# Patient Record
Sex: Female | Born: 1985 | Race: White | Hispanic: No | Marital: Single | Smoking: Current some day smoker
Health system: Southern US, Community
[De-identification: ages and names within clinical notes are randomized; demographics above are authoritative.]

## PROBLEM LIST (undated history)

## (undated) ENCOUNTER — Ambulatory Visit (HOSPITAL_COMMUNITY): Discharge status: Absent without leave | Payer: Medicaid Other

## (undated) DIAGNOSIS — F32A Depression, unspecified: Secondary | ICD-10-CM

## (undated) DIAGNOSIS — F329 Major depressive disorder, single episode, unspecified: Secondary | ICD-10-CM

---

## 2009-06-21 ENCOUNTER — Ambulatory Visit: Payer: Self-pay | Admitting: Diagnostic Radiology

## 2009-06-21 ENCOUNTER — Emergency Department (HOSPITAL_BASED_OUTPATIENT_CLINIC_OR_DEPARTMENT_OTHER): Admission: EM | Admit: 2009-06-21 | Discharge: 2009-06-21 | Payer: Self-pay | Admitting: Emergency Medicine

## 2009-06-23 ENCOUNTER — Emergency Department (HOSPITAL_BASED_OUTPATIENT_CLINIC_OR_DEPARTMENT_OTHER): Admission: EM | Admit: 2009-06-23 | Discharge: 2009-06-23 | Payer: Self-pay | Admitting: Emergency Medicine

## 2009-06-26 ENCOUNTER — Emergency Department (HOSPITAL_BASED_OUTPATIENT_CLINIC_OR_DEPARTMENT_OTHER): Admission: EM | Admit: 2009-06-26 | Discharge: 2009-06-26 | Payer: Self-pay | Admitting: Emergency Medicine

## 2009-06-26 ENCOUNTER — Ambulatory Visit: Payer: Self-pay | Admitting: Diagnostic Radiology

## 2011-12-28 ENCOUNTER — Encounter (HOSPITAL_COMMUNITY): Payer: Self-pay | Admitting: *Deleted

## 2011-12-28 ENCOUNTER — Emergency Department (HOSPITAL_COMMUNITY): Payer: Self-pay

## 2011-12-28 ENCOUNTER — Emergency Department (INDEPENDENT_AMBULATORY_CARE_PROVIDER_SITE_OTHER)
Admission: EM | Admit: 2011-12-28 | Discharge: 2011-12-28 | Disposition: A | Discharge status: Nursing Home (Includes ICF) | Payer: Self-pay | Source: Home / Self Care | Attending: Emergency Medicine | Admitting: Emergency Medicine

## 2011-12-28 ENCOUNTER — Emergency Department (HOSPITAL_COMMUNITY)
Admission: EM | Admit: 2011-12-28 | Discharge: 2011-12-28 | Disposition: A | Discharge status: Home / Self Care | Payer: Self-pay | Attending: Internal Medicine | Admitting: Internal Medicine

## 2011-12-28 ENCOUNTER — Encounter (HOSPITAL_COMMUNITY): Payer: Self-pay

## 2011-12-28 DIAGNOSIS — M549 Dorsalgia, unspecified: Secondary | ICD-10-CM | POA: Insufficient documentation

## 2011-12-28 DIAGNOSIS — Z79899 Other long term (current) drug therapy: Secondary | ICD-10-CM | POA: Insufficient documentation

## 2011-12-28 DIAGNOSIS — R5383 Other fatigue: Secondary | ICD-10-CM | POA: Insufficient documentation

## 2011-12-28 DIAGNOSIS — R209 Unspecified disturbances of skin sensation: Secondary | ICD-10-CM | POA: Insufficient documentation

## 2011-12-28 DIAGNOSIS — R5381 Other malaise: Secondary | ICD-10-CM | POA: Insufficient documentation

## 2011-12-28 DIAGNOSIS — F17200 Nicotine dependence, unspecified, uncomplicated: Secondary | ICD-10-CM | POA: Insufficient documentation

## 2011-12-28 DIAGNOSIS — R42 Dizziness and giddiness: Secondary | ICD-10-CM | POA: Insufficient documentation

## 2011-12-28 DIAGNOSIS — R200 Anesthesia of skin: Secondary | ICD-10-CM

## 2011-12-28 DIAGNOSIS — N390 Urinary tract infection, site not specified: Secondary | ICD-10-CM

## 2011-12-28 DIAGNOSIS — M542 Cervicalgia: Secondary | ICD-10-CM

## 2011-12-28 LAB — BASIC METABOLIC PANEL WITH GFR
Chloride: 107 meq/L (ref 96–112)
GFR calc Af Amer: >90 mL/min (ref >90)
GFR calc non Af Amer: >90 mL/min (ref >90)
Glucose, Bld: 91 mg/dL (ref 70–99)
Potassium: 4.1 meq/L (ref 3.5–5.1)
Sodium: 142 meq/L (ref 135–145)

## 2011-12-28 LAB — DIFFERENTIAL
Basophils Absolute: 0 10*3/uL (ref 0.0–0.1)
Basophils Relative: 0 % (ref 0–1)
Eosinophils Absolute: 0.4 K/uL (ref 0.0–0.7)
Eosinophils Relative: 3 % (ref 0–5)
Lymphocytes Relative: 22 % (ref 12–46)
Lymphs Abs: 2.4 K/uL (ref 0.7–4.0)
Monocytes Absolute: 0.4 10*3/uL (ref 0.1–1.0)
Monocytes Relative: 4 % (ref 3–12)
Neutro Abs: 7.4 K/uL (ref 1.7–7.7)
Neutrophils Relative %: 70 % (ref 43–77)

## 2011-12-28 LAB — BASIC METABOLIC PANEL
BUN: 8 mg/dL (ref 6–23)
CO2: 25 mEq/L (ref 19–32)
Calcium: 9.7 mg/dL (ref 8.4–10.5)
Creatinine, Ser: 0.75 mg/dL (ref 0.50–1.10)

## 2011-12-28 LAB — CBC
HCT: 43.7 % (ref 36.0–46.0)
Hemoglobin: 14 g/dL (ref 12.0–15.0)
MCH: 30.8 pg (ref 26.0–34.0)
MCHC: 32 g/dL (ref 30.0–36.0)
MCV: 96.3 fL (ref 78.0–100.0)
Platelets: 284 K/uL (ref 150–400)
RBC: 4.54 MIL/uL (ref 3.87–5.11)
RDW: 12.9 % (ref 11.5–15.5)
WBC: 10.5 K/uL (ref 4.0–10.5)

## 2011-12-28 LAB — RAPID STREP SCREEN (MED CTR MEBANE ONLY): Streptococcus, Group A Screen (Direct): NEGATIVE

## 2011-12-28 MED ORDER — AMOXICILLIN 500 MG PO CAPS: 500.0000 mg | ORAL_CAPSULE | Freq: Three times a day (TID) | ORAL | Status: AC

## 2011-12-28 NOTE — ED Notes (Signed)
Pt  Reports  That  Last pm   She  Developed  An  Episode  Of  weakness to  r  Side  And  She  Was  unble  To fully  Raise  r  Leg  These  Symptoms  Occurred  Over  12  Hours  Ago      She  amulated  To  Exam  Room and is  Awake  As  Well  As  Alert  At  This  Time

## 2011-12-28 NOTE — Discharge Instructions (Signed)
 If weakness and numbness worsen, please return for re-evaluation at that time.  Your head CT scan was completely normal as were your blood tests.  Your test for strep infection was negative.  Take antibiotics for urinary tract infection and follow up at your doctor's office or urgent care next week if symptoms are not improving.

## 2011-12-28 NOTE — ED Provider Notes (Addendum)
History     CSN: [id number]  Arrival date & time 12/28/11  1054   First MD Initiated Contact with Patient 12/28/11 1117      Chief Complaint  Patient presents with  . Facial Swelling    (Consider location/radiation/quality/duration/timing/severity/associated sxs/prior treatment) HPI Comments: R sided "numbness" my upper arm and R leg" Lats night I couldn't move my R leg at all" since then it comes and goes" "Right now my R arm feels numb and my face" "Im feeling very dizzy"  "For months my urine smells bad" No pain , No fevers, No burning   Also noticed that my R face its very swollen and  have like a double chin"    Patient is a 26 y.o. female presenting with neurologic complaint. The history is provided by the patient.  Neurologic Problem The primary symptoms include dizziness, paresthesias, focal weakness and loss of sensation. Primary symptoms do not include visual change, fever, nausea or vomiting. The symptoms began yesterday. The symptoms are waxing and waning. The neurological symptoms are focal.  Dizziness also occurs with weakness. Dizziness does not occur with nausea or vomiting.  Additional symptoms include weakness. Additional symptoms do not include neck stiffness, leg pain, photophobia, aura, taste disturbance or anxiety. Medical issues do not include seizures or cerebral vascular accident. Workup history does not include CT scan.    History reviewed. No pertinent past medical history.  History reviewed. No pertinent past surgical history.  History reviewed. No pertinent family history.  History  Substance Use Topics  . Smoking status: Current Everyday Smoker  . Smokeless tobacco: Not on file  . Alcohol Use: No    OB History    Grav Para Term Preterm Abortions TAB SAB Ect Mult Living                  Review of Systems  Constitutional: Negative for fever, chills and appetite change.  HENT: Positive for neck pain. Negative for ear pain, congestion,  trouble swallowing and neck stiffness.   Eyes: Negative for photophobia.  Cardiovascular: Negative for chest pain.  Gastrointestinal: Negative for nausea and vomiting.  Genitourinary: Negative for dysuria, vaginal discharge and pelvic pain.  Neurological: Positive for dizziness, focal weakness, weakness, light-headedness, numbness and paresthesias. Negative for speech difficulty.    Allergies  Review of patient's allergies indicates no known allergies.  Home Medications   Current Outpatient Rx  Name Route Sig Dispense Refill  . METHADONE HCL 5 MG PO TABS Oral Take 5 mg by mouth 1 day or 1 dose.    Marland Kitchen AMOXICILLIN 500 MG PO CAPS Oral Take 1 capsule (500 mg total) by mouth 3 (three) times daily. 21 capsule 0  . IBUPROFEN 200 MG PO TABS Oral Take 400 mg by mouth every 8 (eight) hours as needed. For pain    . LEVONORGESTREL 20 MCG/24HR IU IUD Intrauterine 1 each by Intrauterine route once.    Marland Kitchen OVER THE COUNTER MEDICATION Oral Take 1 tablet by mouth 2 (two) times daily as needed. Allergic reaction      BP 144/87  Pulse 80  Temp(Src) 97.9 F (36.6 C) (Oral)  Resp 20  SpO2 98%  Physical Exam  Nursing note and vitals reviewed. Constitutional: She is oriented to person, place, and time. She appears well-developed and well-nourished.  HENT:  Head: Normocephalic. Head is without contusion.  Mouth/Throat: Uvula is midline, oropharynx is clear and moist and mucous membranes are normal.  Eyes: Conjunctivae and EOM are normal. Pupils are  equal, round, and reactive to light. No scleral icterus.  Neck: Trachea normal. Neck supple. No JVD present. No tracheal deviation present. No thyromegaly present.    Cardiovascular: Exam reveals no gallop and no friction rub.   Musculoskeletal: Normal range of motion. She exhibits no tenderness.  Lymphadenopathy:    She has cervical adenopathy.  Neurological: She is alert and oriented to person, place, and time. No cranial nerve deficit. She exhibits  normal muscle tone. Coordination normal.  Skin: No rash noted.    ED Course  Procedures (including critical care time)  Labs Reviewed - No data to display Ct Head Wo Contrast  12/28/2011  *RADIOLOGY REPORT*  Clinical Data: Right-sided weakness, headache  CT HEAD WITHOUT CONTRAST  Technique:  Contiguous axial images were obtained from the base of the skull through the vertex without contrast.  Comparison: None.  Findings: No evidence of parenchymal hemorrhage or extra-axial fluid collection. No mass lesion, mass effect, or midline shift.  No CT evidence of acute infarction.  Cerebral volume is age appropriate.  No ventriculomegaly.  The visualized paranasal sinuses are essentially clear. The mastoid air cells are unopacified.  No evidence of calvarial fracture.  IMPRESSION: Normal head CT.  Original Report Authenticated By: Charline Bills, M.D.     No diagnosis found.    MDM  Multiple concerns- Paresthesias and reported RLE weakness < 24 hours- No focal exam- Also with concerns of "urine with odor for months" No dysuria, mild palpable R sided anterior LAD.        Jimmie Molly, MD 12/28/11 1201  Jimmie Molly, MD 12/28/11 1840

## 2011-12-28 NOTE — ED Provider Notes (Signed)
History     CSN: [id number]  Arrival date & time 12/28/11  1200   First MD Initiated Contact with Patient 12/28/11 1414      Chief Complaint  Patient presents with  . Numbness    (Consider location/radiation/quality/duration/timing/severity/associated sxs/prior treatment) HPI Comments: Patient reports that for the last several days she has generally been feeling ill. She reports that she has a tender spot in her mid upper thoracic region without associated trauma. She denies any pleuritic pain. She reports that a couple of days ago she developed some right-sided neck pain. She denies sore throat or difficulty swallowing. She denies any shortness of breath. She reports with palpation on the right side of her neck that it is sore to touch. She also reports that she has a double chin and that she's never had that in the past and that it appears abnormal to her. There is no redness, induration or swelling other than what she describes to me as being new. She reports since yesterday she developed some numbness to the right side of her face that went down into her right arm and not associated with weakness. She however then developed numbness and weakness described as a heaviness to her right lower extremity he reports that she cannot lift her leg up nor turn her right foot from side to side. She reports that occurred intermittently for a few hours before resolving. She reports her right leg is completely back to normal and her gait is normal. However she endorses still a small area of numbness to the right side of her face and in her right shoulder. She was seen at the urgent care at this morning and was sent here to the emergency department for further evaluation due to her persistent numbness and report of weakness. She denies any fevers although she endorses some chills. She denies any dental pain. She reports that she is on [place] birth control and does smoke cigarettes. She denies any long distance  recent travel and no associated calf swelling or tenderness the  The history is provided by the patient and medical records.    History reviewed. No pertinent past medical history.  History reviewed. No pertinent past surgical history.  No family history on file.  History  Substance Use Topics  . Smoking status: Current Everyday Smoker  . Smokeless tobacco: Not on file  . Alcohol Use: No    OB History    Grav Para Term Preterm Abortions TAB SAB Ect Mult Living                  Review of Systems  Constitutional: Positive for chills. Negative for fever.  HENT: Positive for neck pain. Negative for neck stiffness and dental problem.   Respiratory: Negative for chest tightness, shortness of breath and wheezing.   Cardiovascular: Negative for chest pain, palpitations and leg swelling.  Gastrointestinal: Negative for nausea and vomiting.  Musculoskeletal: Positive for back pain. Negative for joint swelling.  Neurological: Positive for weakness, light-headedness and numbness. Negative for dizziness and headaches.  All other systems reviewed and are negative.    Allergies  Review of patient's allergies indicates no known allergies.  Home Medications   Current Outpatient Rx  Name Route Sig Dispense Refill  . IBUPROFEN 200 MG PO TABS Oral Take 400 mg by mouth every 8 (eight) hours as needed. For pain    . LEVONORGESTREL 20 MCG/24HR IU IUD Intrauterine 1 each by Intrauterine route once.    . METHADONE  HCL 5 MG PO TABS Oral Take 5 mg by mouth 1 day or 1 dose.    Marland Kitchen OVER THE COUNTER MEDICATION Oral Take 1 tablet by mouth 2 (two) times daily as needed. Allergic reaction    . AMOXICILLIN 500 MG PO CAPS Oral Take 1 capsule (500 mg total) by mouth 3 (three) times daily. 21 capsule 0    BP 125/70  Pulse 85  Temp(Src) 97.4 F (36.3 C) (Oral)  Resp 20  SpO2 95%  Physical Exam  Nursing note and vitals reviewed. Constitutional: She is oriented to person, place, and time. Vital  signs are normal. She appears well-developed and well-nourished. She is active.  Non-toxic appearance. She does not have a sickly appearance. No distress.  HENT:  Head: Normocephalic and atraumatic.  Mouth/Throat: Uvula is midline, oropharynx is clear and moist and mucous membranes are normal.  Eyes: Pupils are equal, round, and reactive to light. No scleral icterus.  Neck: Normal range of motion. Neck supple. No JVD present. No spinous process tenderness and no muscular tenderness present. Carotid bruit is not present. No tracheal deviation, no edema, no erythema and normal range of motion present. No mass and no thyromegaly present.  Cardiovascular: Normal rate.   No murmur heard. Pulmonary/Chest: Effort normal and breath sounds normal.  Abdominal: Soft. There is no tenderness.  Musculoskeletal: She exhibits no edema and no tenderness.  Lymphadenopathy:    She has no cervical adenopathy.  Neurological: She is alert and oriented to person, place, and time. She has normal strength. She displays no atrophy and no tremor. No cranial nerve deficit or sensory deficit. She exhibits normal muscle tone. Coordination normal. GCS eye subscore is 4. GCS verbal subscore is 5. GCS motor subscore is 6.       No upper extremity arm drift. Normal finger to nose cerebellar examination. No facial droop is noted. The respiratory nerve testing is normal.  Skin: Skin is warm and dry. No rash noted. No erythema. No pallor.  Psychiatric: She has a normal mood and affect.    ED Course  Procedures (including critical care time)   Labs Reviewed  CBC  DIFFERENTIAL  BASIC METABOLIC PANEL  RAPID STREP SCREEN   Ct Head Wo Contrast  12/28/2011  *RADIOLOGY REPORT*  Clinical Data: Right-sided weakness, headache  CT HEAD WITHOUT CONTRAST  Technique:  Contiguous axial images were obtained from the base of the skull through the vertex without contrast.  Comparison: None.  Findings: No evidence of parenchymal hemorrhage  or extra-axial fluid collection. No mass lesion, mass effect, or midline shift.  No CT evidence of acute infarction.  Cerebral volume is age appropriate.  No ventriculomegaly.  The visualized paranasal sinuses are essentially clear. The mastoid air cells are unopacified.  No evidence of calvarial fracture.  IMPRESSION: Normal head CT.  Original Report Authenticated By: Charline Bills, M.D.   I reviewed the above head CT scan myself.  1. Neck pain   2. Numbness of face   3. Numbness and tingling of right arm and leg   4. Urinary tract infection     Room air saturation is 98% which is normal.  MDM  Patient has a normal, nonfocal neurologic examination currently. I do not see or feel any abnormalities of her face or neck. There is no lymphadenopathy, thyromegaly or JVD noted. Her oropharynx is unremarkable. 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 She is not hypertensive. She does have some risk factors as she smokes and is on birth control. However clinically, I do not feel that she requires TIA protocol at this time. There is no heart murmur noted on auscultation. I will also obtain electrolytes panel and a strep screen.      3:50 PM Patient's head CT scan is interpreted as normal by the radiologist. I did review it myself. And strep screen are also normal.  She had a UTI seen at urgent care.  Will puu her on cephalosporin that can treat both UTI and URI/throat infection.    Gavin Pound. Hiram Mciver, MD 12/28/11 1551

## 2011-12-28 NOTE — ED Notes (Signed)
Sent to [place] from urgent care, rt. Side weakness, intermittently since last night, rt. Side face,. RUE and LLE, no facial droop speech is clear.

## 2011-12-28 NOTE — ED Notes (Signed)
Pt  Has  Several   Days  Of   Swelling  To  r  Side  Face  With  Tenderness  Present       As well  As  C/o  Low  Back  Pain  With  Pain  Down  r  Leg       X  sev  Days  She  Also  Reports  Symptoms      Of  painfull  Foul  Smelling  Urination as  Well

## 2012-03-08 ENCOUNTER — Emergency Department (HOSPITAL_COMMUNITY)
Admission: EM | Admit: 2012-03-08 | Discharge: 2012-03-08 | Disposition: A | Discharge status: Home / Self Care | Payer: Self-pay | Attending: Emergency Medicine | Admitting: Emergency Medicine

## 2012-03-08 ENCOUNTER — Emergency Department (HOSPITAL_COMMUNITY): Payer: Self-pay

## 2012-03-08 ENCOUNTER — Encounter (HOSPITAL_COMMUNITY): Payer: Self-pay

## 2012-03-08 DIAGNOSIS — N949 Unspecified condition associated with female genital organs and menstrual cycle: Secondary | ICD-10-CM | POA: Insufficient documentation

## 2012-03-08 DIAGNOSIS — Z79899 Other long term (current) drug therapy: Secondary | ICD-10-CM | POA: Insufficient documentation

## 2012-03-08 DIAGNOSIS — R102 Pelvic and perineal pain: Secondary | ICD-10-CM

## 2012-03-08 DIAGNOSIS — N390 Urinary tract infection, site not specified: Secondary | ICD-10-CM | POA: Insufficient documentation

## 2012-03-08 LAB — URINALYSIS, ROUTINE W REFLEX MICROSCOPIC
Bilirubin Urine: NEGATIVE
Hgb urine dipstick: NEGATIVE
Ketones, ur: NEGATIVE mg/dL
Nitrite: POSITIVE — AB
pH: 7 (ref 5.0–8.0)

## 2012-03-08 LAB — DIFFERENTIAL
Eosinophils Absolute: 0.2 10*3/uL (ref 0.0–0.7)
Eosinophils Relative: 2 % (ref 0–5)
Lymphs Abs: 4 10*3/uL (ref 0.7–4.0)
Monocytes Absolute: 0.6 10*3/uL (ref 0.1–1.0)
Monocytes Relative: 5 % (ref 3–12)

## 2012-03-08 LAB — LIPASE, BLOOD: Lipase: 23 U/L (ref 11–59)

## 2012-03-08 LAB — CBC
HCT: 41.3 % (ref 36.0–46.0)
Hemoglobin: 14 g/dL (ref 12.0–15.0)
MCH: 31.9 pg (ref 26.0–34.0)
MCV: 94.1 fL (ref 78.0–100.0)
Platelets: 284 10*3/uL (ref 150–400)
RBC: 4.39 MIL/uL (ref 3.87–5.11)

## 2012-03-08 LAB — COMPREHENSIVE METABOLIC PANEL
BUN: 9 mg/dL (ref 6–23)
CO2: 25 mEq/L (ref 19–32)
Calcium: 9.7 mg/dL (ref 8.4–10.5)
Creatinine, Ser: 0.87 mg/dL (ref 0.50–1.10)
GFR calc Af Amer: >90 mL/min (ref >90)
GFR calc non Af Amer: >90 mL/min (ref >90)
Glucose, Bld: 99 mg/dL (ref 70–99)
Total Protein: 7.3 g/dL (ref 6.0–8.3)

## 2012-03-08 LAB — URINE MICROSCOPIC-ADD ON

## 2012-03-08 LAB — WET PREP, GENITAL
Clue Cells Wet Prep HPF POC: NONE SEEN
Trich, Wet Prep: NONE SEEN

## 2012-03-08 MED ORDER — DEXTROSE 5 % IV SOLN
1.0000 g | Freq: Once | INTRAVENOUS | Status: AC
  Administered 2012-03-08: 1 g via INTRAVENOUS
  Filled 2012-03-08: qty 10

## 2012-03-08 MED ORDER — CEPHALEXIN 500 MG PO CAPS: 500.0000 mg | ORAL_CAPSULE | Freq: Four times a day (QID) | ORAL | Status: AC

## 2012-03-08 MED ORDER — HYDROCODONE-ACETAMINOPHEN 5-325 MG PO TABS: 2.0000 | ORAL_TABLET | ORAL | Status: AC | PRN

## 2012-03-08 MED ORDER — MORPHINE SULFATE 4 MG/ML IJ SOLN
4.0000 mg | Freq: Once | INTRAMUSCULAR | Status: AC
  Administered 2012-03-08: 4 mg via INTRAVENOUS
  Filled 2012-03-08: qty 1

## 2012-03-08 MED ORDER — IBUPROFEN 800 MG PO TABS: 800.0000 mg | ORAL_TABLET | Freq: Three times a day (TID) | ORAL | Status: AC

## 2012-03-08 MED ORDER — SODIUM CHLORIDE 0.9 % IV BOLUS (SEPSIS)
1000.0000 mL | Freq: Once | INTRAVENOUS | Status: AC
  Administered 2012-03-08: 1000 mL via INTRAVENOUS

## 2012-03-08 MED ORDER — ONDANSETRON HCL 4 MG/2ML IJ SOLN
4.0000 mg | Freq: Once | INTRAMUSCULAR | Status: AC
  Administered 2012-03-08: 4 mg via INTRAVENOUS
  Filled 2012-03-08: qty 2

## 2012-03-08 NOTE — ED Notes (Signed)
WAs treated for a bladder infection by [place] 2 months ago and pt. Believes she is not any better. Continues to have severe abdominal pain and bloating, denies any n/v/d

## 2012-03-08 NOTE — ED Notes (Signed)
To ultrasound now

## 2012-03-08 NOTE — Discharge Instructions (Signed)
Urinary Tract Infection  Infections of the urinary tract can start in several places. A bladder infection (cystitis), a kidney infection (pyelonephritis), and a prostate infection (prostatitis) are different types of urinary tract infections (UTIs). They usually get better if treated with medicines (antibiotics) that kill germs. Take all the medicine until it is gone. You or your child may feel better in a few days, but TAKE ALL MEDICINE or the infection may not respond and may become more difficult to treat.  HOME CARE INSTRUCTIONS    Drink enough water and fluids to keep the urine clear or pale yellow. Cranberry juice is especially recommended, in addition to large amounts of water.   Avoid caffeine, tea, and carbonated beverages. They tend to irritate the bladder.   Alcohol may irritate the prostate.   Only take over-the-counter or prescription medicines for pain, discomfort, or fever as directed by your caregiver.  To prevent further infections:   Empty the bladder often. Avoid holding urine for long periods of time.   After a bowel movement, women should cleanse from front to back. Use each tissue only once.   Empty the bladder before and after sexual intercourse.  FINDING OUT THE RESULTS OF YOUR TEST  Not all test results are available during your visit. If your or your child's test results are not back during the visit, make an appointment with your caregiver to find out the results. Do not assume everything is normal if you have not heard from your caregiver or the medical facility. It is important for you to follow up on all test results.  SEEK MEDICAL CARE IF:    There is back pain.   Your baby is older than 3 months with a rectal temperature of 100.5 F (38.1 C) or higher for more than 1 day.   Your or your child's problems (symptoms) are no better in 3 days. Return sooner if you or your child is getting worse.  SEEK IMMEDIATE MEDICAL CARE IF:    There is severe back pain or lower abdominal  pain.   You or your child develops chills.   You have a fever.   Your baby is older than 3 months with a rectal temperature of 102 F (38.9 C) or higher.   Your baby is 3 months old or younger with a rectal temperature of 100.4 F (38 C) or higher.   There is nausea or vomiting.   There is continued burning or discomfort with urination.  MAKE SURE YOU:    Understand these instructions.   Will watch your condition.   Will get help right away if you are not doing well or get worse.  Document Released: 08/31/2005 Document Revised: 11/10/2011 Document Reviewed: 04/05/2007  Exit[employer] Patient Information 2012 [employer]    Pelvic Pain  Pelvic pain is pain below the belly button and located between your hips. Acute pain may last a few hours or days. Chronic pelvic pain may last weeks and months. The cause may be different for different types of pain. The pain may be dull or sharp, mild or severe and can interfere with your daily activities. Write down and tell your caregiver:    Exactly where the pain is located.   If it comes and goes or is there all the time.   When it happens (with sex, urination, bowel movement, etc.)   If the pain is related to your menstrual period or stress.  Your caregiver will take a full history and do a   complete physical exam and Pap test.  CAUSES    Painful menstrual periods (dysmenorrhea).   Normal ovulation (Mittelschmertz) that occurs in the middle of the menstrual cycle every month.   The pelvic organs get engorged with blood just before the menstrual period (pelvic congestive syndrome).   Scar tissue from an infection or past surgery (pelvic adhesions).   Cancer of the female pelvic organs. When there is pain with cancer, it has been there for a long time.   The lining of the uterus (endometrium) abnormally grows in places like the pelvis and on the pelvic organs (endometriosis).   A form of endometriosis with the lining of the uterus present inside of the muscle  tissue of the uterus (adenomyosis).   Fibroid tumor (noncancerous) in the uterus.   Bladder problems such as infection, bladder spasms of the muscle tissue of the bladder.   Intestinal problems (irritable bowel syndrome, colitis, an ulcer or gastrointestinal infection).   Polyps of the cervix or uterus.   Pregnancy in the tube (ectopic pregnancy).   The opening of the cervix is too small for the menstrual blood to flow through it (cervical stenosis).   Physical or sexual abuse (past or present).   Musculo-skeletal problems from poor posture, problems with the vertebrae of the lower back or the uterine pelvic muscles falling (prolapse).   Psychological problems such as depression or stress.   IUD (intrauterine device) in the uterus.  DIAGNOSIS   Tests to make a diagnosis depends on the type, location, severity and what causes the pain to occur. Tests that may be needed include:   Blood tests.   Urine tests   Ultrasound.   X-rays.   CT Scan.   MRI.   Laparoscopy.   Major surgery.  TREATMENT   Treatment will depend on the cause of the pain, which includes:   Prescription or over-the-counter pain medication.   Antibiotics.   Birth control pills.   Hormone treatment.   Nerve blocking injections.   Physical therapy.   Antidepressants.   Counseling with a psychiatrist or psychologist.   Minor or major surgery.  HOME CARE INSTRUCTIONS    Only take over-the-counter or prescription medicines for pain, discomfort or fever as directed by your caregiver.   Follow your caregiver's advice to treat your pain.   Rest.   Avoid sexual intercourse if it causes the pain.   Apply warm or cold compresses (which ever works best) to the pain area.   Do relaxation exercises such as yoga or meditation.   Try acupuncture.   Avoid stressful situations.   Try group therapy.   If the pain is because of a stomach/intestinal upset, drink clear liquids, eat a bland light food diet until the symptoms go  away.  SEEK MEDICAL CARE IF:    You need stronger prescription pain medication.   You develop pain with sexual intercourse.   You have pain with urination.   You develop a temperature of 102 F (38.9 C) with the pain.   You are still in pain after 4 hours of taking prescription medication for the pain.   You need depression medication.   Your IUD is causing pain and you want it removed.  SEEK IMMEDIATE MEDICAL CARE IF:   You develop very severe pain or tenderness.   You faint, have chills, severe weakness or dehydration.   You develop heavy vaginal bleeding or passing solid tissue.   You develop a temperature of 102 F (38.9 C) with   the pain.   You have blood in the urine.   You are being physically or sexually abused.   You have uncontrolled vomiting and diarrhea.   You are depressed and afraid of harming yourself or someone else.  Document Released: 12/29/2004 Document Revised: 11/10/2011 Document Reviewed: 09/25/2008  [employer] Patient Information 2012 [employer]

## 2012-03-08 NOTE — ED Provider Notes (Signed)
History     CSN: [id number]  Arrival date & time 03/08/12  1601   First MD Initiated Contact with Patient 03/08/12 1731      Chief Complaint  Patient presents with  . Abdominal Pain    (Consider location/radiation/quality/duration/timing/severity/associated sxs/prior treatment) HPI Comments: Patient has pelvic pain for the past 3 days has been constant and severe. It is similar to when she had a bladder infection 2 months ago. She denies any nausea, vomiting, diarrhea, fever. She has good by mouth intake and urine output. She has normal bowel movements. She is unmarried birth control does not know when her last period was. She denies any back pain. No dysuria or hematuria.  The history is provided by the patient.    History reviewed. No pertinent past medical history.  History reviewed. No pertinent past surgical history.  No family history on file.  History  Substance Use Topics  . Smoking status: Current Everyday Smoker  . Smokeless tobacco: Not on file  . Alcohol Use: No    OB History    Grav Para Term Preterm Abortions TAB SAB Ect Mult Living                  Review of Systems  Constitutional: Negative for fever, activity change and appetite change.  HENT: Negative for congestion and rhinorrhea.   Eyes: Negative for photophobia.  Respiratory: Negative for cough and shortness of breath.   Cardiovascular: Negative for chest pain.  Gastrointestinal: Positive for abdominal pain. Negative for nausea, vomiting and diarrhea.  Genitourinary: Positive for pelvic pain. Negative for dysuria, hematuria, vaginal bleeding and vaginal discharge.  Musculoskeletal: Negative for back pain.  Skin: Negative for rash.  Neurological: Negative for weakness and headaches.    Allergies  Review of patient's allergies indicates no known allergies.  Home Medications   Current Outpatient Rx  Name Route Sig Dispense Refill  . LEVONORGESTREL 20 MCG/24HR IU IUD Intrauterine 1 each by  Intrauterine route once.    . METHADONE HCL 5 MG PO TABS Oral Take 5 mg by mouth 1 day or 1 dose.    . CEPHALEXIN 500 MG PO CAPS Oral Take 1 capsule (500 mg total) by mouth 4 (four) times daily. 20 capsule 0  . HYDROCODONE-ACETAMINOPHEN 5-325 MG PO TABS Oral Take 2 tablets by mouth every 4 (four) hours as needed for pain. 10 tablet 0  . IBUPROFEN 800 MG PO TABS Oral Take 1 tablet (800 mg total) by mouth 3 (three) times daily. 21 tablet 0    BP 115/70  Pulse 75  Temp(Src) 98.2 F (36.8 C) (Oral)  Resp 18  SpO2 97%  Physical Exam  Constitutional: She is oriented to person, place, and time. She appears well-developed and well-nourished.  HENT:  Head: Normocephalic and atraumatic.  Mouth/Throat: Oropharynx is clear and moist. No oropharyngeal exudate.  Eyes: Conjunctivae are normal. Pupils are equal, round, and reactive to light.  Neck: Normal range of motion. Neck supple.  Cardiovascular: Normal rate, regular rhythm and normal heart sounds.   Pulmonary/Chest: Breath sounds normal. No respiratory distress.  Abdominal: Soft. There is tenderness. There is no rebound and no guarding.       Moderate tenderness in the lower abdomen diffusely. No guarding or rebound  Genitourinary: There is no tenderness on the right labia. There is no tenderness on the left labia. Cervix exhibits discharge. Cervix exhibits no motion tenderness. Right adnexum displays no tenderness. Left adnexum displays no tenderness.  Suprapubic tenderness only  Musculoskeletal: Normal range of motion. She exhibits no edema and no tenderness.       No CVA tenderness  Neurological: She is alert and oriented to person, place, and time. No cranial nerve deficit.  Skin: Skin is warm.    ED Course  Procedures (including critical care time)  Labs Reviewed  URINALYSIS, ROUTINE W REFLEX MICROSCOPIC - Abnormal; Notable for the following:    APPearance CLOUDY (*)    Nitrite POSITIVE (*)    Leukocytes, UA MODERATE (*)     All other components within normal limits  CBC - Abnormal; Notable for the following:    WBC 11.7 (*)    All other components within normal limits  COMPREHENSIVE METABOLIC PANEL - Abnormal; Notable for the following:    Total Bilirubin 0.1 (*)    All other components within normal limits  WET PREP, GENITAL - Abnormal; Notable for the following:    WBC, Wet Prep HPF POC FEW (*)    All other components within normal limits  URINE MICROSCOPIC-ADD ON - Abnormal; Notable for the following:    Squamous Epithelial / LPF MANY (*)    Bacteria, UA MANY (*)    All other components within normal limits  POCT PREGNANCY, URINE  DIFFERENTIAL  LIPASE, BLOOD  GC/CHLAMYDIA PROBE AMP, GENITAL  URINE CULTURE   US Transvaginal Non-ob  03/08/2012  *RADIOLOGY REPORT*  Clinical Data: Pelvic pain.  TRANSABDOMINAL AND TRANSVAGINAL ULTRASOUND OF PELVIS Technique:  Both transabdominal and transvaginal ultrasound examinations of the pelvis were performed. Transabdominal technique was performed for global imaging of the pelvis including uterus, ovaries, adnexal regions, and pelvic cul-de-sac.  Comparison: None   It was necessary to proceed with endovaginal exam following the transabdominal exam to visualize the ovaries and endometrium.  Findings:  Uterus: Measures 9.4 x 3.6 x 4.5 cm.  No myometrial abnormalities are identified.  Endometrium: Normal in thickness measuring a maximum of 3 mm.  An IUD is noted in the endometrial canal.  A small amount of fluid is noted in the lower endometrial canal and in the cervix.  Right ovary:  Measures 2.9 x 2.0 x 1.7 cm.  No cysts or masses.  Left ovary: Measures 3.7 x 2.1 x 2.3 cm.  A simple appearing 1.7 x 1.9 x 1.9 cm cyst is noted.  Other findings: No free fluid  IMPRESSION:  1.  IUD noted in the endometrial canal. 2.  Small amount of fluid in the lower endometrial canal and cervix. 3.  Normal ovaries except for a simple left renal cyst.  Original Report Authenticated By: P. Loralie Champagne, M.D.   US Pelvis Complete  03/08/2012  *RADIOLOGY REPORT*  Clinical Data: Pelvic pain.  TRANSABDOMINAL AND TRANSVAGINAL ULTRASOUND OF PELVIS Technique:  Both transabdominal and transvaginal ultrasound examinations of the pelvis were performed. Transabdominal technique was performed for global imaging of the pelvis including uterus, ovaries, adnexal regions, and pelvic cul-de-sac.  Comparison: None   It was necessary to proceed with endovaginal exam following the transabdominal exam to visualize the ovaries and endometrium.  Findings:  Uterus: Measures 9.4 x 3.6 x 4.5 cm.  No myometrial abnormalities are identified.  Endometrium: Normal in thickness measuring a maximum of 3 mm.  An IUD is noted in the endometrial canal.  A small amount of fluid is noted in the lower endometrial canal and in the cervix.  Right ovary:  Measures 2.9 x 2.0 x 1.7 cm.  No cysts or masses.  Left ovary:  Measures 3.7 x 2.1 x 2.3 cm.  A simple appearing 1.7 x 1.9 x 1.9 cm cyst is noted.  Other findings: No free fluid  IMPRESSION:  1.  IUD noted in the endometrial canal. 2.  Small amount of fluid in the lower endometrial canal and cervix. 3.  Normal ovaries except for a simple left renal cyst.  Original Report Authenticated By: P. Loralie Champagne, M.D.     1. Urinary tract infection   2. Pelvic pain       MDM  Pelvic pain without nausea, vomiting or fever. No urinary or vaginal symptoms.  Urinalysis, pelvic exam, hCG  UTI treatment, follow up GYN.  No ovarian abnormalities on [place].      Glynn Octave, MD 03/09/12 1147

## 2012-03-08 NOTE — ED Notes (Signed)
The pt has lower abd pain for the past 6 weeks she was seen here in the ed and was given a rx for a uti.  The pain has continued  And it is a constant pain lmp now

## 2012-03-08 NOTE — ED Notes (Signed)
Iv lt wrist  Iv meds given and iv antibiotic has infused.

## 2012-03-11 LAB — URINE CULTURE

## 2012-03-12 NOTE — ED Notes (Signed)
+   Urine  Treated per protocol MD; Sensitive to same 

## 2013-12-13 ENCOUNTER — Emergency Department (HOSPITAL_COMMUNITY): Payer: Medicaid Other

## 2013-12-13 ENCOUNTER — Emergency Department (HOSPITAL_COMMUNITY)
Admission: EM | Admit: 2013-12-13 | Discharge: 2013-12-13 | Disposition: A | Discharge status: Home / Self Care | Payer: Medicaid Other | Attending: Emergency Medicine | Admitting: Emergency Medicine

## 2013-12-13 ENCOUNTER — Encounter (HOSPITAL_COMMUNITY): Payer: Self-pay | Admitting: Emergency Medicine

## 2013-12-13 DIAGNOSIS — S0083XA Contusion of other part of head, initial encounter: Secondary | ICD-10-CM | POA: Diagnosis not present

## 2013-12-13 DIAGNOSIS — Y9389 Activity, other specified: Secondary | ICD-10-CM | POA: Insufficient documentation

## 2013-12-13 DIAGNOSIS — Y92410 Unspecified street and highway as the place of occurrence of the external cause: Secondary | ICD-10-CM | POA: Diagnosis not present

## 2013-12-13 DIAGNOSIS — F17200 Nicotine dependence, unspecified, uncomplicated: Secondary | ICD-10-CM | POA: Diagnosis not present

## 2013-12-13 DIAGNOSIS — Z79899 Other long term (current) drug therapy: Secondary | ICD-10-CM | POA: Diagnosis not present

## 2013-12-13 DIAGNOSIS — M542 Cervicalgia: Secondary | ICD-10-CM

## 2013-12-13 DIAGNOSIS — S0093XA Contusion of unspecified part of head, initial encounter: Secondary | ICD-10-CM

## 2013-12-13 DIAGNOSIS — M549 Dorsalgia, unspecified: Secondary | ICD-10-CM

## 2013-12-13 DIAGNOSIS — IMO0002 Reserved for concepts with insufficient information to code with codable children: Secondary | ICD-10-CM | POA: Diagnosis not present

## 2013-12-13 DIAGNOSIS — S0990XA Unspecified injury of head, initial encounter: Secondary | ICD-10-CM | POA: Insufficient documentation

## 2013-12-13 DIAGNOSIS — S199XXA Unspecified injury of neck, initial encounter: Secondary | ICD-10-CM | POA: Diagnosis not present

## 2013-12-13 DIAGNOSIS — S0003XA Contusion of scalp, initial encounter: Secondary | ICD-10-CM | POA: Insufficient documentation

## 2013-12-13 DIAGNOSIS — R51 Headache: Secondary | ICD-10-CM

## 2013-12-13 DIAGNOSIS — S1093XA Contusion of unspecified part of neck, initial encounter: Secondary | ICD-10-CM

## 2013-12-13 DIAGNOSIS — S0993XA Unspecified injury of face, initial encounter: Secondary | ICD-10-CM | POA: Insufficient documentation

## 2013-12-13 DIAGNOSIS — R519 Headache, unspecified: Secondary | ICD-10-CM

## 2013-12-13 MED ORDER — HYDROMORPHONE HCL PF 1 MG/ML IJ SOLN
1.0000 mg | Freq: Once | INTRAMUSCULAR | Status: AC
Start: 2013-12-13 — End: 2013-12-13
  Administered 2013-12-13: 1 mg via INTRAMUSCULAR
  Filled 2013-12-13: qty 1

## 2013-12-13 MED ORDER — HYDROCODONE-ACETAMINOPHEN 5-325 MG PO TABS
2.0000 | ORAL_TABLET | Freq: Once | ORAL | Status: AC
  Administered 2013-12-13: 2 via ORAL
  Filled 2013-12-13: qty 2

## 2013-12-13 MED ORDER — TRAMADOL HCL 50 MG PO TABS: 50.0000 mg | ORAL_TABLET | Freq: Four times a day (QID) | ORAL | Status: DC | PRN
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
## 2013-12-13 NOTE — Progress Notes (Signed)
Patient is very fixated on where her boyfriend is, her clothes, and what medications and dosage she's getting. Patient is on the phone with boyfriend discussing how much medication she has been given. It has been explained the need to complete test. She was able to sit upright in bed without assistance. Pain medications have been given, patient will be reassessed.
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
## 2013-12-13 NOTE — ED Notes (Signed)
Patient transported to CT 
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
## 2013-12-13 NOTE — Progress Notes (Signed)
Iv placed by EMS to right anterior forearm. Removed by RN on discharge.

## 2013-12-24 DIAGNOSIS — F419 Anxiety disorder, unspecified: Secondary | ICD-10-CM | POA: Insufficient documentation

## 2015-10-03 ENCOUNTER — Encounter (HOSPITAL_COMMUNITY): Payer: Self-pay | Admitting: Nurse Practitioner

## 2015-10-03 ENCOUNTER — Inpatient Hospital Stay (HOSPITAL_COMMUNITY)
Admission: EM | Admit: 2015-10-03 | Discharge: 2015-10-05 | DRG: 546 | Disposition: A | Discharge status: Home / Self Care | Payer: Medicaid Other | Attending: Student in an Organized Health Care Education/Training Program | Admitting: Student in an Organized Health Care Education/Training Program

## 2015-10-03 ENCOUNTER — Observation Stay (HOSPITAL_COMMUNITY): Payer: Medicaid Other

## 2015-10-03 DIAGNOSIS — R238 Other skin changes: Secondary | ICD-10-CM

## 2015-10-03 DIAGNOSIS — F1410 Cocaine abuse, uncomplicated: Secondary | ICD-10-CM | POA: Diagnosis present

## 2015-10-03 DIAGNOSIS — F17210 Nicotine dependence, cigarettes, uncomplicated: Secondary | ICD-10-CM | POA: Diagnosis present

## 2015-10-03 DIAGNOSIS — L259 Unspecified contact dermatitis, unspecified cause: Secondary | ICD-10-CM | POA: Diagnosis present

## 2015-10-03 DIAGNOSIS — I776 Arteritis, unspecified: Principal | ICD-10-CM | POA: Diagnosis present

## 2015-10-03 DIAGNOSIS — F329 Major depressive disorder, single episode, unspecified: Secondary | ICD-10-CM | POA: Diagnosis present

## 2015-10-03 DIAGNOSIS — R21 Rash and other nonspecific skin eruption: Secondary | ICD-10-CM | POA: Diagnosis present

## 2015-10-03 DIAGNOSIS — N179 Acute kidney failure, unspecified: Secondary | ICD-10-CM | POA: Diagnosis present

## 2015-10-03 DIAGNOSIS — L509 Urticaria, unspecified: Secondary | ICD-10-CM

## 2015-10-03 DIAGNOSIS — Z79899 Other long term (current) drug therapy: Secondary | ICD-10-CM

## 2015-10-03 DIAGNOSIS — T783XXA Angioneurotic edema, initial encounter: Secondary | ICD-10-CM

## 2015-10-03 DIAGNOSIS — F419 Anxiety disorder, unspecified: Secondary | ICD-10-CM | POA: Diagnosis present

## 2015-10-03 DIAGNOSIS — R748 Abnormal levels of other serum enzymes: Secondary | ICD-10-CM | POA: Diagnosis present

## 2015-10-03 DIAGNOSIS — F1290 Cannabis use, unspecified, uncomplicated: Secondary | ICD-10-CM | POA: Diagnosis present

## 2015-10-03 DIAGNOSIS — F1910 Other psychoactive substance abuse, uncomplicated: Secondary | ICD-10-CM

## 2015-10-03 HISTORY — DX: Depression, unspecified: F32.A

## 2015-10-03 HISTORY — DX: Major depressive disorder, single episode, unspecified: F32.9

## 2015-10-03 LAB — COMPREHENSIVE METABOLIC PANEL
ALT: 175 U/L — ABNORMAL HIGH (ref 14–54)
AST: 564 U/L — ABNORMAL HIGH (ref 15–41)
Albumin: 4.3 g/dL (ref 3.5–5.0)
Alkaline Phosphatase: 37 U/L — ABNORMAL LOW (ref 38–126)
Anion gap: 12 (ref 5–15)
BUN: 20 mg/dL (ref 6–20)
CO2: 23 mmol/L (ref 22–32)
Calcium: 9.4 mg/dL (ref 8.9–10.3)
Chloride: 98 mmol/L — ABNORMAL LOW (ref 101–111)
Creatinine, Ser: 1.69 mg/dL — ABNORMAL HIGH (ref 0.44–1.00)
GFR calc Af Amer: 46 mL/min — ABNORMAL LOW (ref >60)
GFR calc non Af Amer: 40 mL/min — ABNORMAL LOW (ref >60)
Glucose, Bld: 129 mg/dL — ABNORMAL HIGH (ref 65–99)
Potassium: 3.6 mmol/L (ref 3.5–5.1)
Sodium: 133 mmol/L — ABNORMAL LOW (ref 135–145)
Total Bilirubin: 0.8 mg/dL (ref 0.3–1.2)
Total Protein: 7.7 g/dL (ref 6.5–8.1)

## 2015-10-03 LAB — CBC WITH DIFFERENTIAL/PLATELET
Basophils Absolute: 0 10*3/uL (ref 0.0–0.1)
Basophils Relative: 0 %
Eosinophils Absolute: 0.1 10*3/uL (ref 0.0–0.7)
Eosinophils Relative: 1 %
HCT: 41.4 % (ref 36.0–46.0)
Hemoglobin: 13.8 g/dL (ref 12.0–15.0)
Lymphocytes Relative: 20 %
Lymphs Abs: 2.7 10*3/uL (ref 0.7–4.0)
MCH: 31.9 pg (ref 26.0–34.0)
MCHC: 33.3 g/dL (ref 30.0–36.0)
MCV: 95.6 fL (ref 78.0–100.0)
Monocytes Absolute: 0.8 10*3/uL (ref 0.1–1.0)
Monocytes Relative: 6 %
Neutro Abs: 10.1 10*3/uL — ABNORMAL HIGH (ref 1.7–7.7)
Neutrophils Relative %: 73 %
Platelets: 339 10*3/uL (ref 150–400)
RBC: 4.33 MIL/uL (ref 3.87–5.11)
RDW: 12.6 % (ref 11.5–15.5)
WBC: 13.7 10*3/uL — ABNORMAL HIGH (ref 4.0–10.5)

## 2015-10-03 LAB — C-REACTIVE PROTEIN: CRP: 7.8 mg/dL — ABNORMAL HIGH (ref <1.0)

## 2015-10-03 LAB — RAPID URINE DRUG SCREEN, HOSP PERFORMED
Amphetamines: NOT DETECTED
Barbiturates: NOT DETECTED
Benzodiazepines: POSITIVE — AB
Cocaine: POSITIVE — AB
Opiates: POSITIVE — AB
Tetrahydrocannabinol: POSITIVE — AB

## 2015-10-03 LAB — URINALYSIS, ROUTINE W REFLEX MICROSCOPIC
Bilirubin Urine: NEGATIVE
Glucose, UA: NEGATIVE mg/dL
Ketones, ur: NEGATIVE mg/dL
Nitrite: NEGATIVE
Protein, ur: 30 mg/dL — AB
Specific Gravity, Urine: 1.019 (ref 1.005–1.030)
Urobilinogen, UA: 0.2 mg/dL (ref 0.0–1.0)
pH: 5.5 (ref 5.0–8.0)

## 2015-10-03 LAB — SEDIMENTATION RATE: Sed Rate: 15 mm/hr (ref 0–22)

## 2015-10-03 LAB — URINE MICROSCOPIC-ADD ON

## 2015-10-03 LAB — ACETAMINOPHEN LEVEL: ACETAMINOPHEN (TYLENOL), SERUM: 14 ug/mL (ref 10–30)

## 2015-10-03 LAB — PREGNANCY, URINE: PREG TEST UR: NEGATIVE

## 2015-10-03 MED ORDER — CLOBETASOL PROPIONATE 0.05 % EX CREA
TOPICAL_CREAM | Freq: Two times a day (BID) | CUTANEOUS | Status: DC
  Administered 2015-10-03 – 2015-10-04 (×2): 1 via TOPICAL
  Administered 2015-10-04 – 2015-10-05 (×2): via TOPICAL
  Filled 2015-10-03 (×2): qty 15

## 2015-10-03 MED ORDER — MORPHINE SULFATE (PF) 4 MG/ML IV SOLN
4.0000 mg | Freq: Once | INTRAVENOUS | Status: AC
  Administered 2015-10-03: 4 mg via INTRAVENOUS
  Filled 2015-10-03: qty 1

## 2015-10-03 MED ORDER — FAMOTIDINE IN NACL 20-0.9 MG/50ML-% IV SOLN
20.0000 mg | Freq: Once | INTRAVENOUS | Status: AC
  Administered 2015-10-03: 20 mg via INTRAVENOUS
  Filled 2015-10-03: qty 50

## 2015-10-03 MED ORDER — METHYLPREDNISOLONE SODIUM SUCC 125 MG IJ SOLR
80.0000 mg | Freq: Once | INTRAMUSCULAR | Status: AC
  Administered 2015-10-03: 80 mg via INTRAVENOUS
  Filled 2015-10-03: qty 2

## 2015-10-03 MED ORDER — DIPHENHYDRAMINE HCL 50 MG/ML IJ SOLN
25.0000 mg | Freq: Once | INTRAMUSCULAR | Status: AC
  Administered 2015-10-03: 25 mg via INTRAVENOUS
  Filled 2015-10-03: qty 1

## 2015-10-03 MED ORDER — HYDROCODONE-ACETAMINOPHEN 5-325 MG PO TABS
1.0000 | ORAL_TABLET | ORAL | Status: DC | PRN
  Administered 2015-10-03 – 2015-10-04 (×3): 2 via ORAL
  Filled 2015-10-03 (×4): qty 2

## 2015-10-03 MED ORDER — SODIUM CHLORIDE 0.9 % IV BOLUS (SEPSIS)
1000.0000 mL | Freq: Once | INTRAVENOUS | Status: AC
Start: 2015-10-03 — End: 2015-10-03
  Administered 2015-10-03: 1000 mL via INTRAVENOUS
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
## 2015-10-03 NOTE — ED Notes (Signed)
Erika Taylor - [phone]- [phone] if needed

## 2015-10-03 NOTE — ED Notes (Signed)
Lab confirmed they can add-on these tests: CRP and Sed Rate.

## 2015-10-03 NOTE — ED Notes (Signed)
Admitting MD at bedside.

## 2015-10-03 NOTE — Progress Notes (Signed)
New patient admit alert and responsive with blisters and +4 edema on both hands, noticed redness on left side forehead, left shoulder, left breast left eye and vaginal area, assisted her to eat her sandwitch, given vicodin prior to transfer from ER, right saline lock, will continue to monitor.

## 2015-10-03 NOTE — ED Provider Notes (Signed)
Physical Exam  BP 127/84 mmHg  Pulse 78  Temp(Src) 97.6 F (36.4 C) (Oral)  Resp 16  Ht  (1.753 m)  Wt 172 lb 7 oz (78.217 kg)  BMI 25.45 kg/m2  SpO2 96%  Physical Exam  ED Course  Procedures  MDM Received patient sign out from [facility], PA-C at the beginning of shift. Patient here with linear vesiculo-bullae rash to L forehead, L upper arm, L chest and bilateral hands.  Rash has appearance of contact dermatitis with surrounding angioedema. No evidence of mucosal involvement to suggest SJS, or TEN at this time. Patient does not appear toxic however she is very drowsy. She attributed Her drowsiness due to lack of sleep from the discomfort of the rash. Her UDS positive for cocaine, opiates, benzos, and THC. Elevated WBC of 13.7, likely reactive. Evidence of renal insufficiency with creatinine of 1.69, IV fluid given. Evidence of transaminitis with AST 564, AST 175, alkaline phosphatase 37. Elevated CRP of 7.8. Normal sedimentation rates. Patient was initially consults to hospitalist with request observation however they request dermatology involvement. I specifically consulted [facility] Dermatologist, Dr. Lorenza Chick and send her a series of picture of pt's rash.  It was felt that this is likely contact dermatitis from suspect poison ivy/poison oak.  Pt does admits to doing yard work a few days ago but does not think she came in contact with any offending agents.  She is currently afebrile, VSS.  Dermatologist recommend clobetasol 0.05% TID x 3 days and transition to OTC hydrocortisone cream the the remainder of the time.  Patient will also need to follow-up with her primary care provider for recheck of her labs to ensure resolutions of her labs abnormalities.   Pt has shown improvement of sxs after receiving IVF, solumedrol, pepcid, benadryl in the ED.  No evidence of compartment syndrome to bilateral forearms.    6:44 PM On recheck pt now developed mucosal vesicular rash to lower lip.  Given the progressive nature of the rash, i discussed with Dr. Jeraldine Loots who agrees pt should be admitted for obs and to r/o SJS or TEN.    BP 131/87 mmHg  Pulse 84  Temp(Src) 97.6 F (36.4 C) (Oral)  Resp 16  Ht  (1.753 m)  Wt 172 lb 7 oz (78.217 kg)  BMI 25.45 kg/m2  SpO2 98%  I have reviewed nursing notes and vital signs. I personally viewed the imaging tests through PACS system and agrees with radiologist's intepretation I reviewed available ER/hospitalization records through the EMR  Results for orders placed or performed during the hospital encounter of 10/03/15  CBC with Differential  Result Value Ref Range   WBC 13.7 (H) 4.0 - 10.5 K/uL   RBC 4.33 3.87 - 5.11 MIL/uL   Hemoglobin 13.8 12.0 - 15.0 g/dL   HCT 96.0 45.4 - 09.8 %   MCV 95.6 78.0 - 100.0 fL   MCH 31.9 26.0 - 34.0 pg   MCHC 33.3 30.0 - 36.0 g/dL   RDW 11.9 14.7 - 82.9 %   Platelets 339 150 - 400 K/uL   Neutrophils Relative % 73 %   Neutro Abs 10.1 (H) 1.7 - 7.7 K/uL   Lymphocytes Relative 20 %   Lymphs Abs 2.7 0.7 - 4.0 K/uL   Monocytes Relative 6 %   Monocytes Absolute 0.8 0.1 - 1.0 K/uL   Eosinophils Relative 1 %   Eosinophils Absolute 0.1 0.0 - 0.7 K/uL   Basophils Relative 0 %   Basophils  Absolute 0.0 0.0 - 0.1 K/uL  Comprehensive metabolic panel  Result Value Ref Range   Sodium 133 (L) 135 - 145 mmol/L   Potassium 3.6 3.5 - 5.1 mmol/L   Chloride 98 (L) 101 - 111 mmol/L   CO2 23 22 - 32 mmol/L   Glucose, Bld 129 (H) 65 - 99 mg/dL   BUN 20 6 - 20 mg/dL   Creatinine, Ser 7.821.69 (H) 0.44 - 1.00 mg/dL   Calcium 9.4 8.9 - 95.610.3 mg/dL   Total Protein 7.7 6.5 - 8.1 g/dL   Albumin 4.3 3.5 - 5.0 g/dL   AST 213564 (H) 15 - 41 U/L   ALT 175 (H) 14 - 54 U/L   Alkaline Phosphatase 37 (L) 38 - 126 U/L   Total Bilirubin 0.8 0.3 - 1.2 mg/dL   GFR calc non Af Amer 40 (L) >60 mL/min   GFR calc Af Amer 46 (L) >60 mL/min   Anion gap 12 5 - 15  Urine rapid drug screen (hosp performed)  Result Value Ref Range    Opiates POSITIVE (A) NONE DETECTED   Cocaine POSITIVE (A) NONE DETECTED   Benzodiazepines POSITIVE (A) NONE DETECTED   Amphetamines NONE DETECTED NONE DETECTED   Tetrahydrocannabinol POSITIVE (A) NONE DETECTED   Barbiturates NONE DETECTED NONE DETECTED  Urinalysis, Routine w reflex microscopic ([facility])  Result Value Ref Range   Color, Urine AMBER (A) YELLOW   APPearance CLOUDY (A) CLEAR   Specific Gravity, Urine 1.019 1.005 - 1.030   pH 5.5 5.0 - 8.0   Glucose, UA NEGATIVE NEGATIVE mg/dL   Hgb urine dipstick LARGE (A) NEGATIVE   Bilirubin Urine NEGATIVE NEGATIVE   Ketones, ur NEGATIVE NEGATIVE mg/dL   Protein, ur 30 (A) NEGATIVE mg/dL   Urobilinogen, UA 0.2 0.0 - 1.0 mg/dL   Nitrite NEGATIVE NEGATIVE   Leukocytes, UA SMALL (A) NEGATIVE  C-reactive protein  Result Value Ref Range   CRP 7.8 (H) <1.0 mg/dL  Sedimentation rate  Result Value Ref Range   Sed Rate 15 0 - 22 mm/hr  Urine microscopic-add on  Result Value Ref Range   Squamous Epithelial / LPF FEW (A) RARE   WBC, UA 3-6 <3 WBC/hpf   Bacteria, UA FEW (A) RARE   Casts GRANULAR CAST (A) NEGATIVE   No results found.      Fayrene HelperBowie Mariadejesus Cade, PA-C 10/03/15 1946  Gerhard Munchobert Lockwood, MD 10/03/15 [id number]

## 2015-10-03 NOTE — ED Provider Notes (Signed)
CSN: [id number]     Arrival date & time 10/03/15  1215 History   First MD Initiated Contact with Patient 10/03/15 1344     Chief Complaint  Patient presents with  . Skin Problem   HPI   29 year old female presents today with rash and swelling to her hands face, and upper extremity. Pt states symptoms began last night with minor swelling to the hands and a small rash to the forehead and shoulder. She reports that over the night symptoms continue to persist for this morning she started developing extreme pain to the hands. At this time I evaluation patient reports that she cannot feel the fingertips, cannot flex or extend her hands due to swelling, and is developing blisters to the areas of swelling. She reports redness to the left shoulder with clear blisters, and the same to the left anterior forehead. She she notes swelling to the lower lip, but denies any swelling of the tongue, mouth,  throat, no wheezing or difficulty breathing. Patient denies any abdominal pain. She reports no history of exposure to abnormal food or drink, exposure to new body care products, shampoos, detergents. She notes no changes to daily medication, only take swelled be treated daily, , no drug or alcohol use. Patient reports she's been otherwise healthy up until this presentation. No one around her is experiencing similar symptoms. She attempted using Aleve prior to arrival in the emergency room which did not improve pain.   Past Medical History  Diagnosis Date  . Depression    History reviewed. No pertinent past surgical history. History reviewed. No pertinent family history. Social History  Substance Use Topics  . Smoking status: Current Every Day Smoker  . Smokeless tobacco: None  . Alcohol Use: No   OB History    No data available     Review of Systems  All other systems reviewed and are negative.   Allergies  Review of patient's allergies indicates no known allergies.  Home Medications   Prior to  Admission medications   Medication Sig Start Date End Date Taking? Authorizing Provider  buPROPion (WELLBUTRIN SR) 200 MG 12 hr tablet Take 200 mg by mouth 2 (two) times daily.   Yes Historical Provider, MD  ibuprofen (ADVIL,MOTRIN) 200 MG tablet Take 400 mg by mouth every 6 (six) hours as needed (pain).   Yes Historical Provider, MD  levonorgestrel (MIRENA) 20 MCG/24HR IUD 1 each by Intrauterine route once.   Yes Historical Provider, MD   BP 127/84 mmHg  Pulse 78  Temp(Src) 97.6 F (36.4 C) (Oral)  Resp 16  Ht  (1.753 m)  Wt 172 lb 7 oz (78.217 kg)  BMI 25.45 kg/m2  SpO2 96%    Physical Exam  Constitutional: She is oriented to person, place, and time. She appears well-developed and well-nourished.  HENT:  Head: Normocephalic and atraumatic.  Oropharynx is clear, no signs of swelling or post pharyngeal edema, tongue normal  Eyes: Conjunctivae are normal. Pupils are equal, round, and reactive to light. Right eye exhibits no discharge. Left eye exhibits no discharge. No scleral icterus.  Neck: Normal range of motion. No JVD present. No tracheal deviation present.  Pulmonary/Chest: Effort normal and breath sounds normal. No stridor. No respiratory distress. She has no wheezes. She has no rales. She exhibits no tenderness.  Abdominal: Soft. She exhibits no distension and no mass. There is no tenderness. There is no [id number] guarding.  Musculoskeletal: Normal range of motion. She exhibits no edema or  tenderness.  See picture. Extreme pain to the hands bilateral, radial pulse 2+, Refill less than 3 seconds in the fingertips. No sensation in the distal fingertips. No swelling or edema to the feet.  Neurological: She is alert and oriented to person, place, and time. Coordination normal.  Skin: Skin is warm and dry. Rash noted.  Erythema with clear blistering to the left anterior forehead, left shoulder, left forearm  Psychiatric: She has a normal mood and affect. Her behavior is  normal. Judgment and thought content normal.  Nursing note and vitals reviewed.       ED Course  Procedures (including critical care time) Labs Review Labs Reviewed  CBC WITH DIFFERENTIAL/PLATELET - Abnormal; Notable for the following:    WBC 13.7 (*)    Neutro Abs 10.1 (*)    All other components within normal limits  COMPREHENSIVE METABOLIC PANEL - Abnormal; Notable for the following:    Sodium 133 (*)    Chloride 98 (*)    Glucose, Bld 129 (*)    Creatinine, Ser 1.69 (*)    AST 564 (*)    ALT 175 (*)    Alkaline Phosphatase 37 (*)    GFR calc non Af Amer 40 (*)    GFR calc Af Amer 46 (*)    All other components within normal limits  URINE RAPID DRUG SCREEN, HOSP PERFORMED - Abnormal; Notable for the following:    Opiates POSITIVE (*)    Cocaine POSITIVE (*)    Benzodiazepines POSITIVE (*)    Tetrahydrocannabinol POSITIVE (*)    All other components within normal limits  URINALYSIS, ROUTINE W REFLEX MICROSCOPIC (NOT AT [facility]) - Abnormal; Notable for the following:    Color, Urine AMBER (*)    APPearance CLOUDY (*)    Hgb urine dipstick LARGE (*)    Protein, ur 30 (*)    Leukocytes, UA SMALL (*)    All other components within normal limits  C-REACTIVE PROTEIN - Abnormal; Notable for the following:    CRP 7.8 (*)    All other components within normal limits  URINE MICROSCOPIC-ADD ON - Abnormal; Notable for the following:    Squamous Epithelial / LPF FEW (*)    Bacteria, UA FEW (*)    Casts GRANULAR CAST (*)    All other components within normal limits  SEDIMENTATION RATE  C4 COMPLEMENT    Imaging Review No results found. I have personally reviewed and evaluated these images and lab results as part of my medical decision-making.   EKG Interpretation None      MDM   Final diagnoses:  Angioedema, initial encounter  Bullae  Urticaria    Labs: CBC, CMP- significant for WBC of 13.7, creatinine of 1.69, AST 564, ALT 175.  Drug Panel positive for opiates,  cocaine, benzodiazepines, and THC.   Imaging:  Consults:  Therapeutics: Diphenhydramine, Pepcid, Solu-Medrol, morphine, normal saline  Discharge Meds:   Assessment/Plan:  29 year old female presents with likely immune related response to unknown source. Patient has no known exposure to insects, new body care products, or any known exacerbating factor. No new medication changes, known experiencing similar symptoms. She has no history of the same. Initially on arrival she was in excruciating pain, cannot feel her fingertips, concern for early signs of compartment syndrome. Patient was administered the above medications with improvement in her symptoms. On reevaluation she had full sensation to the fingertips, Refill remained less than 3 seconds, and no worsening of symptoms. Patient had no signs of  airway involvement. Due to patient's severe symptoms, hospital service will be consult at 4 observation with continued management. After discussing case with Dr. Cena BentonVega of [facility] hospitalist service and available dermatology resources it was [facility]. Pt care turned over to Fayrene HelperBowie Tran PA-C pending [facility] and further management.         Eyvonne MechanicJeffrey Dantavious Snowball, PA-C 10/03/15 1747  Leta BaptistEmily Roe Nguyen, MD 10/03/15 2255

## 2015-10-03 NOTE — H&P (Signed)
Date: 10/03/2015               Patient Name:  Erika Taylor MRN: [id number]  DOB: August 07, 1986 Age / Sex: 29 y.o., female   PCP: Ernest Haber, MD         Medical Service: Internal Medicine Teaching Service         Attending Physician: Dr. Harvel Quale, MD    First Contact: Dr. Lindon Romp Pager: [phone]  Second Contact: Dr. Michail Jewels Pager: [phone]       After Hours (After 5p/  First Contact Pager: [phone]  weekends / holidays): Second Contact Pager: [phone]   Chief Complaint: Swelling and blistering of skin  History of Present Illness: Erika Taylor is a 29 y.o. female with no significant past medical history other than anxiety and depression who presents to the Emergency Department with swelling and blistering of her bilateral hands.  States that she went to bed last night feeling her normal self and awoke this morning at approximately 4 AM with swelling and numbness in her fingers that she describes as "pins and needles times 10."  States she has never experienced symptoms like this before and has no known allergies that she is aware of.  She states she took 1051m of Tylenol and went back to sleep but woke up approximately 30-60 minutes later with worsening pain and so presented to the Emergency Department.    She reports no new exposures to detergents, soaps, foods, or drinks.  Denies being burned.  Denies any contact with anything that could potentially be an offending agent.   Her prescription medications are Wellbutrin, Klonapin, and Vitamin D.  States she finished a course of antibiotics about 1 month ago for bacterial vaginosis.  She does not recall the exact name of medication but states that she remembers being told not to drink alcohol while on this medication.  She lives at home with her boyfriend of 611years and 172year old son and denies any sick contacts or others having experienced similar symptoms.  Patient does report getting a manicure yesterday but  states they only worked on her nails and there were no moisturizers or lotions applied to her hands.  Patient reports having a sore throat for a few days but otherwise reports no difficulty breathing, no dysphagia, shortness of breath.  She denies any chest pain, N/V/D, urinary complaints.  She rates her pain as a 9/10.  In the ED, patient received IV fluid bolus, Benadryl 214mIV, Pepcid 202mV, SoluMedrol 74m55m, and Morphine 4mg 54mx 2.    Meds: Current Facility-Administered Medications  Medication Dose Route Frequency Provider Last Rate Last Dose  . clobetasol cream (TEMOVATE) 0.05 %   Topical BID BowieDomenic MorasC       No current outpatient prescriptions on file.    Allergies: Allergies as of 10/03/2015  . (No Known Allergies)   Past Medical History  Diagnosis Date  . Depression    History reviewed. No pertinent past surgical history. History reviewed. No pertinent family history. Social History   Social History  . Marital Status: Single    Spouse Name: N/A  . Number of Children: N/A  . Years of Education: N/A   Occupational History  . Not on file.   Social History Main Topics  . Smoking status: Current Every Day Smoker  . Smokeless tobacco: Not on file  . Alcohol Use: No  . Drug Use: No  . Sexual Activity: Yes  Birth Control/ Protection: IUD   Other Topics Concern  . Not on file   Social History Narrative    Review of Systems: Review of Systems  Constitutional: Negative for fever and chills.  HENT: Positive for sore throat. Negative for ear pain.   Eyes: Negative for blurred vision, pain and discharge.  Respiratory: Negative for cough and shortness of breath.   Cardiovascular: Negative for chest pain and leg swelling.  Gastrointestinal: Negative for nausea, vomiting and abdominal pain.  Genitourinary: Negative for dysuria.  Musculoskeletal: Negative for joint pain.  Skin: Positive for rash.       Positive for swelling of bilateral hands    Neurological: Negative for dizziness, loss of consciousness and headaches.  Psychiatric/Behavioral: Negative for depression.   Physical Exam: Blood pressure 126/71, pulse 98, temperature 97.6 F (36.4 C), temperature source Oral, resp. rate 16, height 5' 9"  (1.753 m), weight 172 lb 7 oz (78.217 kg), SpO2 97 %.   General: Vital signs reviewed.  Patient is a well-developed and well-nourished, in mild distress and cooperative with exam.  Head: Normocephalic.  Left sided anterior forehead with erythematous rash w/ blistering.  Oropharynx clear, no signs of post-pharyngeal edema.  Tongue is normal.  Lower lip is mildly swollen. Eyes: PERRL, EOMI, conjunctivae normal, No scleral icterus, no conjunctival discharge.  Neck: Supple, trachea midline, normal ROM, No JVD, masses, thyromegaly, or carotid bruit present.  Cardiovascular: RRR, S1 normal, S2 normal, no murmurs, gallops, or rubs. Pulmonary/Chest: Air entry equal bilaterally, no wheezes, rales, or rhonchi. Abdominal: Soft, non-tender, non-distended, BS +, no masses, organomegaly, or guarding present.  Extremities/Skin: See Pictures.  Both hands swollen. Right hand with erythema and tense bulla between 1st and 2nd digits.  No drainage.  Not tender to palpation but painful due to swelling.   Left hand with bulla in the web space between 4th and 5th digits and palmar area.  Another area of blistering present on distal left wrist.  Left shoulder has area of erythema without blistering.  Left breast has scattered areas of redness without blistering.  Bilateral feet and lower extremities have no swelling or rash.  Distal pulses of upper and lower extremities are intact.  Unable to make a fist or extend fingers fully            Psychiatric: Normal mood and affect. speech and behavior is normal. Cognition and memory are normal.   Lab results: Basic Metabolic Panel:  Recent Labs  10/03/15 1345  NA 133*  K 3.6  CL 98*  CO2 23  GLUCOSE 129*   BUN 20  CREATININE 1.69*  CALCIUM 9.4   Liver Function Tests:  Recent Labs  10/03/15 1345  AST 564*  ALT 175*  ALKPHOS 37*  BILITOT 0.8  PROT 7.7  ALBUMIN 4.3   No results for input(s): LIPASE, AMYLASE in the last 72 hours. No results for input(s): AMMONIA in the last 72 hours. CBC:  Recent Labs  10/03/15 1345  WBC 13.7*  NEUTROABS 10.1*  HGB 13.8  HCT 41.4  MCV 95.6  PLT 339   Cardiac Enzymes: No results for input(s): CKTOTAL, CKMB, CKMBINDEX, TROPONINI in the last 72 hours. BNP: No results for input(s): PROBNP in the last 72 hours. D-Dimer: No results for input(s): DDIMER in the last 72 hours. CBG: No results for input(s): GLUCAP in the last 72 hours. Hemoglobin A1C: No results for input(s): HGBA1C in the last 72 hours. Fasting Lipid Panel: No results for input(s): CHOL, HDL, LDLCALC, TRIG, CHOLHDL,  LDLDIRECT in the last 72 hours. Thyroid Function Tests: No results for input(s): TSH, T4TOTAL, FREET4, T3FREE, THYROIDAB in the last 72 hours. Anemia Panel: No results for input(s): VITAMINB12, FOLATE, FERRITIN, TIBC, IRON, RETICCTPCT in the last 72 hours. Coagulation: No results for input(s): LABPROT, INR in the last 72 hours. Urine Drug Screen: Drugs of Abuse     Component Value Date/Time   LABOPIA POSITIVE* 10/03/2015 1621   COCAINSCRNUR POSITIVE* 10/03/2015 1621   LABBENZ POSITIVE* 10/03/2015 1621   AMPHETMU NONE DETECTED 10/03/2015 1621   THCU POSITIVE* 10/03/2015 1621   LABBARB NONE DETECTED 10/03/2015 1621    Alcohol Level: No results for input(s): ETH in the last 72 hours. Urinalysis:  Recent Labs  10/03/15 1621  COLORURINE AMBER*  LABSPEC 1.019  PHURINE 5.5  GLUCOSEU NEGATIVE  HGBUR LARGE*  BILIRUBINUR NEGATIVE  KETONESUR NEGATIVE  PROTEINUR 30*  UROBILINOGEN 0.2  NITRITE NEGATIVE  LEUKOCYTESUR SMALL*   Misc. Labs: none  Imaging results:  No results found.  Other results: EKG: none.  Assessment & Plan by Problem: Active  Problems:   * No active hospital problems. *  Erythematous rash with tense bullae: acute onset of swelling of bilateral hands with rash and tense bullae to bilateral hands, left wrist, left shoulder, left chest, and left forehead.  Also, swelling of lower lip.  Labs that are abnormal include a creatine of 1.69 (Cr from 2013 were ~0.80), WBC count of 13.7 with 73% PMNs (absolute PMNs 10.1), AST 564 (16 in 2013), ALT 175 (13 in 2013), CRP 7.8.  ESR is normal, alk phos 37.  Urine with blood and protein.  UDS +.  Vital signs stable, patient afebrile. ED provider consulted [facility] Dermatologist (Dr. Shary Decamp) and sent a series of pictures.  Felt that this was likely a contact dermatitis, however, unclear what the source would be.  They recommended clobetasol 0.05% TID x 3 days with transition to OTC hydrocortisone cream.  Distribution of bullae and rash is generalized to include multiple area, but is more localized to the hands.  Differential can include contact dermatitis of unknown source, porphyria cutanea tarda as this can show an elevation in LFTs, bullous pemphigoid although this is normally seen in older adults, and bullous systemic lupus erythematous given her rise in creatine, blood and protein in urine. -check ANA -CMP, CBC, hepatitis panel, acetaminophen level -follow up C4 complement for evaluation of SLE and angioedema -clobetasol cream 0.05% TID -prednisone 536m daily -triamcinolone cream 0.1% QID -diphenhydramine 271mIV BID -hydrocodone-acetaminophen 5-325 1-2 tablests q4h prn  Elevated Liver Enzymes -abdominal ultrasound   AKI -Creatine of 1.69 -Given IVF, follow up AM BMET  Substance abuse -UDS positive for opiates, cocaine, benzodiazepines, and THC.  Patient states that she last used cocaine approximately 1 week ago and occasionally smokes marijuana.  She states has prescription for Klonapin.  She was given 36m236morphine approximately 2 hours before UDS was collected.  Patient  reports she will drink alcohol approximately 3 times per week and drinks about a glass of wine when she does consume alcohol  Depression/Anxiety -clonazepam 1mg30mily prn -Hold wellbutrin  F/E/N:  -IVF bolus 1 L x 2 -Regular Diet  Code: Full  DVT PPx: Lovenox  Dispo: Disposition is deferred at this time, awaiting improvement of current medical problems.   The patient does have a current PCP (ChriErnest Haber) and [facility] after discharge.  The patient does not have transportation limitations that hinder transportation to  clinic appointments.  Signed: Jule Ser, DO 10/03/2015, 8:54 PM

## 2015-10-03 NOTE — ED Notes (Signed)
Lab confirmed they can add on C4 complement.

## 2015-10-03 NOTE — ED Notes (Signed)
Attempted report 

## 2015-10-03 NOTE — ED Notes (Signed)
Boyfriend pulled me to side in waiting room, states that he has been with the patient since last night prior to the injury and he does not know how the injury occurred but he knows that the injury is not a burn

## 2015-10-04 ENCOUNTER — Observation Stay (HOSPITAL_COMMUNITY): Payer: Medicaid Other

## 2015-10-04 DIAGNOSIS — R2232 Localized swelling, mass and lump, left upper limb: Secondary | ICD-10-CM | POA: Diagnosis present

## 2015-10-04 DIAGNOSIS — R748 Abnormal levels of other serum enzymes: Secondary | ICD-10-CM | POA: Diagnosis present

## 2015-10-04 DIAGNOSIS — F329 Major depressive disorder, single episode, unspecified: Secondary | ICD-10-CM | POA: Diagnosis present

## 2015-10-04 DIAGNOSIS — Z720 Tobacco use: Secondary | ICD-10-CM | POA: Diagnosis not present

## 2015-10-04 DIAGNOSIS — L139 Bullous disorder, unspecified: Secondary | ICD-10-CM | POA: Diagnosis not present

## 2015-10-04 DIAGNOSIS — L259 Unspecified contact dermatitis, unspecified cause: Secondary | ICD-10-CM | POA: Diagnosis present

## 2015-10-04 DIAGNOSIS — F17210 Nicotine dependence, cigarettes, uncomplicated: Secondary | ICD-10-CM | POA: Diagnosis present

## 2015-10-04 DIAGNOSIS — F419 Anxiety disorder, unspecified: Secondary | ICD-10-CM | POA: Diagnosis present

## 2015-10-04 DIAGNOSIS — I776 Arteritis, unspecified: Secondary | ICD-10-CM | POA: Diagnosis not present

## 2015-10-04 DIAGNOSIS — F1910 Other psychoactive substance abuse, uncomplicated: Secondary | ICD-10-CM | POA: Diagnosis not present

## 2015-10-04 DIAGNOSIS — N179 Acute kidney failure, unspecified: Secondary | ICD-10-CM | POA: Diagnosis present

## 2015-10-04 DIAGNOSIS — R21 Rash and other nonspecific skin eruption: Secondary | ICD-10-CM

## 2015-10-04 DIAGNOSIS — F1410 Cocaine abuse, uncomplicated: Secondary | ICD-10-CM | POA: Diagnosis present

## 2015-10-04 DIAGNOSIS — F1290 Cannabis use, unspecified, uncomplicated: Secondary | ICD-10-CM | POA: Diagnosis present

## 2015-10-04 DIAGNOSIS — R2231 Localized swelling, mass and lump, right upper limb: Secondary | ICD-10-CM | POA: Diagnosis not present

## 2015-10-04 DIAGNOSIS — Z79899 Other long term (current) drug therapy: Secondary | ICD-10-CM | POA: Diagnosis not present

## 2015-10-04 LAB — CBC
HCT: 35.4 % — ABNORMAL LOW (ref 36.0–46.0)
HEMOGLOBIN: 11.6 g/dL — AB (ref 12.0–15.0)
MCH: 31.5 pg (ref 26.0–34.0)
MCHC: 32.8 g/dL (ref 30.0–36.0)
MCV: 96.2 fL (ref 78.0–100.0)
Platelets: 303 10*3/uL (ref 150–400)
RBC: 3.68 MIL/uL — ABNORMAL LOW (ref 3.87–5.11)
RDW: 12.6 % (ref 11.5–15.5)
WBC: 14.6 10*3/uL — ABNORMAL HIGH (ref 4.0–10.5)

## 2015-10-04 LAB — COMPREHENSIVE METABOLIC PANEL
ALT: 183 U/L — AB (ref 14–54)
AST: 530 U/L — AB (ref 15–41)
Albumin: 3.9 g/dL (ref 3.5–5.0)
Alkaline Phosphatase: 34 U/L — ABNORMAL LOW (ref 38–126)
Anion gap: 9 (ref 5–15)
BILIRUBIN TOTAL: 0.6 mg/dL (ref 0.3–1.2)
BUN: 8 mg/dL (ref 6–20)
CO2: 25 mmol/L (ref 22–32)
Calcium: 9.2 mg/dL (ref 8.9–10.3)
Chloride: 101 mmol/L (ref 101–111)
Creatinine, Ser: 1.05 mg/dL — ABNORMAL HIGH (ref 0.44–1.00)
GFR calc Af Amer: >60 mL/min (ref >60)
GLUCOSE: 119 mg/dL — AB (ref 65–99)
Potassium: 4 mmol/L (ref 3.5–5.1)
Sodium: 135 mmol/L (ref 135–145)
TOTAL PROTEIN: 6.7 g/dL (ref 6.5–8.1)

## 2015-10-04 LAB — C4 COMPLEMENT: Complement C4, Body Fluid: 24 mg/dL (ref 14–44)

## 2015-10-04 MED ORDER — CLINDAMYCIN PHOSPHATE 900 MG/50ML IV SOLN
900.0000 mg | Freq: Three times a day (TID) | INTRAVENOUS | Status: DC
  Administered 2015-10-04 – 2015-10-05 (×3): 900 mg via INTRAVENOUS
  Filled 2015-10-04 (×5): qty 50

## 2015-10-04 MED ORDER — SODIUM CHLORIDE 0.9 % IV BOLUS (SEPSIS)
1000.0000 mL | Freq: Once | INTRAVENOUS | Status: AC
  Administered 2015-10-04: 1000 mL via INTRAVENOUS

## 2015-10-04 MED ORDER — OXYCODONE HCL 5 MG PO TABS: 5.0000 mg | ORAL_TABLET | ORAL | Status: DC | PRN

## 2015-10-04 MED ORDER — OXYCODONE HCL 5 MG PO TABS
5.0000 mg | ORAL_TABLET | Freq: Once | ORAL | Status: AC
  Administered 2015-10-04: 5 mg via ORAL
  Filled 2015-10-04: qty 1

## 2015-10-04 MED ORDER — OXYCODONE HCL 5 MG PO TABS
5.0000 mg | ORAL_TABLET | ORAL | Status: DC | PRN
  Administered 2015-10-04 – 2015-10-05 (×5): 5 mg via ORAL
  Filled 2015-10-04 (×6): qty 1

## 2015-10-04 MED ORDER — PREDNISONE 20 MG PO TABS
40.0000 mg | ORAL_TABLET | Freq: Every day | ORAL | Status: DC
  Administered 2015-10-04: 40 mg via ORAL
  Filled 2015-10-04: qty 2

## 2015-10-04 MED ORDER — OXYCODONE-ACETAMINOPHEN 5-325 MG PO TABS: 1.0000 | ORAL_TABLET | ORAL | Status: DC | PRN

## 2015-10-04 MED ORDER — ENOXAPARIN SODIUM 40 MG/0.4ML ~~LOC~~ SOLN
40.0000 mg | SUBCUTANEOUS | Status: DC
  Administered 2015-10-04: 40 mg via SUBCUTANEOUS
  Filled 2015-10-04 (×3): qty 0.4

## 2015-10-04 MED ORDER — CLONAZEPAM 1 MG PO TABS
1.0000 mg | ORAL_TABLET | Freq: Every day | ORAL | Status: DC | PRN
  Administered 2015-10-04 – 2015-10-05 (×2): 1 mg via ORAL
  Filled 2015-10-04 (×3): qty 1

## 2015-10-04 MED ORDER — TRIAMCINOLONE ACETONIDE 0.1 % EX CREA
TOPICAL_CREAM | Freq: Four times a day (QID) | CUTANEOUS | Status: DC
  Administered 2015-10-04: 14:00:00 via TOPICAL
  Administered 2015-10-04: 1 via TOPICAL
  Administered 2015-10-04 – 2015-10-05 (×4): via TOPICAL
  Filled 2015-10-04 (×2): qty 15

## 2015-10-04 MED ORDER — NICOTINE 14 MG/24HR TD PT24
14.0000 mg | MEDICATED_PATCH | Freq: Every day | TRANSDERMAL | Status: DC
  Administered 2015-10-04: 14 mg via TRANSDERMAL
  Filled 2015-10-04 (×2): qty 1

## 2015-10-04 MED ORDER — OXYCODONE-ACETAMINOPHEN 5-325 MG PO TABS
1.0000 | ORAL_TABLET | ORAL | Status: DC | PRN
  Administered 2015-10-04 – 2015-10-05 (×2): 1 via ORAL
  Filled 2015-10-04 (×2): qty 1

## 2015-10-04 MED ORDER — DIPHENHYDRAMINE HCL 50 MG/ML IJ SOLN
25.0000 mg | Freq: Two times a day (BID) | INTRAMUSCULAR | Status: DC
  Administered 2015-10-04 (×2): 25 mg via INTRAVENOUS
  Filled 2015-10-04 (×3): qty 1

## 2015-10-04 NOTE — Progress Notes (Signed)
Subjective: NAEON. Patient has had no relief with IV Solumedrol or Clobetasol cream.  The bullae and swelling of her hands are unchanged, and may be worsening.  The rash does not itch.  She denies fever or chills.  She has a dog but says it is always walked on leash on the sidewalk and has had no exposures. She denies working in the yard.  She denies bug bites.  No one else has a similar rash or bug bites.  She has only taken Metronidazole for BV one week ago, which she has taken before.  No other new drugs.    Objective: Vital signs in last 24 hours: Filed Vitals:   10/03/15 2100 10/03/15 2130 10/03/15 2226 10/04/15 0543  BP: 140/91 137/68 132/72 123/77  Pulse: 80 88 69 87  Temp:   97.6 F (36.4 C) 98.6 F (37 C)  TempSrc:   Oral Oral  Resp:   17 17  Height:   5' 10"  (1.778 m)   Weight:   172 lb 9.6 oz (78.291 kg)   SpO2: 96% 97% 95% 96%   Weight change:   Intake/Output Summary (Last 24 hours) at 10/04/15 1047 Last data filed at 10/04/15 0600  Gross per 24 hour  Intake    240 ml  Output      0 ml  Net    240 ml   Physical Exam  Constitutional: She is oriented to person, place, and time and well-developed, well-nourished, and in no distress.  Uncomfortable, but no distress.  HENT:  Head: Normocephalic and atraumatic.  No pharyngeal or buccal lesions.  No eye lesions.  Eyes: Conjunctivae and EOM are normal. No scleral icterus.  Neck: No tracheal deviation present.  Cardiovascular: Normal rate, regular rhythm, normal heart sounds and intact distal pulses.   Pulmonary/Chest: Effort normal and breath sounds normal. No respiratory distress. She has no wheezes.  Abdominal: Soft. She exhibits no distension. There is no tenderness. There is no rebound and no guarding.  Musculoskeletal:  Prominent non pitting swelling of dorsal hands.  Neurological: She is alert and oriented to person, place, and time.  Skin: Skin is warm and dry.  Multiple bullae present on bilateral palms and  left hand 4th-5th finger webspace.  Linear vesicles apparent on left forearm.  Bullae and vesicles present on background erythema.  Smaller 1-2 mm vesicles to left anterior forehead with underlying erythema.  Erythematous macules on left shoulder and left breast.    Lab Results: Basic Metabolic Panel:  Recent Labs Lab 10/03/15 1345 10/04/15 0605  NA 133* 135  K 3.6 4.0  CL 98* 101  CO2 23 25  GLUCOSE 129* 119*  BUN 20 8  CREATININE 1.69* 1.05*  CALCIUM 9.4 9.2   Liver Function Tests:  Recent Labs Lab 10/03/15 1345 10/04/15 0605  AST 564* 530*  ALT 175* 183*  ALKPHOS 37* 34*  BILITOT 0.8 0.6  PROT 7.7 6.7  ALBUMIN 4.3 3.9   No results for input(s): LIPASE, AMYLASE in the last 168 hours. No results for input(s): AMMONIA in the last 168 hours. CBC:  Recent Labs Lab 10/03/15 1345 10/04/15 0605  WBC 13.7* 14.6*  NEUTROABS 10.1*  --   HGB 13.8 11.6*  HCT 41.4 35.4*  MCV 95.6 96.2  PLT 339 303   Cardiac Enzymes: No results for input(s): CKTOTAL, CKMB, CKMBINDEX, TROPONINI in the last 168 hours. BNP: No results for input(s): PROBNP in the last 168 hours. D-Dimer: No results for input(s): DDIMER in the  last 168 hours. CBG: No results for input(s): GLUCAP in the last 168 hours. Hemoglobin A1C: No results for input(s): HGBA1C in the last 168 hours. Fasting Lipid Panel: No results for input(s): CHOL, HDL, LDLCALC, TRIG, CHOLHDL, LDLDIRECT in the last 168 hours. Thyroid Function Tests: No results for input(s): TSH, T4TOTAL, FREET4, T3FREE, THYROIDAB in the last 168 hours. Coagulation: No results for input(s): LABPROT, INR in the last 168 hours. Anemia Panel: No results for input(s): VITAMINB12, FOLATE, FERRITIN, TIBC, IRON, RETICCTPCT in the last 168 hours. Urine Drug Screen: Drugs of Abuse     Component Value Date/Time   LABOPIA POSITIVE* 10/03/2015 1621   COCAINSCRNUR POSITIVE* 10/03/2015 1621   LABBENZ POSITIVE* 10/03/2015 1621   AMPHETMU NONE DETECTED  10/03/2015 1621   THCU POSITIVE* 10/03/2015 1621   LABBARB NONE DETECTED 10/03/2015 1621    Alcohol Level: No results for input(s): ETH in the last 168 hours. Urinalysis:  Recent Labs Lab 10/03/15 1621  COLORURINE AMBER*  LABSPEC 1.019  PHURINE 5.5  GLUCOSEU NEGATIVE  HGBUR LARGE*  BILIRUBINUR NEGATIVE  KETONESUR NEGATIVE  PROTEINUR 30*  UROBILINOGEN 0.2  NITRITE NEGATIVE  LEUKOCYTESUR SMALL*   Misc. Labs:   Micro Results: No results found for this or any previous visit (from the past 240 hour(s)). Studies/Results: No results found. Medications: I have reviewed the patient's current medications. Scheduled Meds: . clindamycin (CLEOCIN) IV  900 mg Intravenous 3 times per day  . clobetasol cream   Topical BID  . diphenhydrAMINE  25 mg Intravenous BID  . enoxaparin (LOVENOX) injection  40 mg Subcutaneous Q24H  . oxyCODONE  5 mg Oral Once  . triamcinolone cream   Topical QID   Continuous Infusions:  PRN Meds:.clonazePAM, oxyCODONE, oxyCODONE-acetaminophen Assessment/Plan: Active Problems:   Bullous rash   Elevated liver enzymes   AKI (acute kidney injury) [facility])  Ms. Gwenevere Goga is a 29 y.o. female with no significant past medical history other than anxiety and depression who presents to the Emergency Department with swelling and blistering of her bilateral hands.  Erythematous rash with tense bullae: acute onset of swelling of bilateral hands with rash and tense bullae to bilateral hands, left wrist, left shoulder, left chest, and left forehead. Labs that are abnormal include a creatine of 1.69 (Cr from 2013 were ~0.80), WBC count of 13.7 with 73% PMNs (absolute PMNs 10.1), AST 564 (16 in 2013), ALT 175 (13 in 2013), CRP 7.8. ESR is normal, alk phos 37. Urine with large blood and protein. UDS +. Vital signs stable, patient afebrile. ED provider consulted [facility] Dermatologist (Dr. Shary Decamp) and sent a series of pictures. Felt that this was likely a contact  dermatitis, however, unclear what the source would be. They recommended clobetasol 0.05% TID x 3 days with transition to OTC hydrocortisone cream. Patient reports no improvement in swelling, bullae, or pain after IV Solumedrol or Clobetasol.  Though she has linear vesicles, none of the vesicles/bullae itch, which is very inconsistent with contact dermatitis.  Cr corrected with IVF, but WBC increasing and AST/ALT remaining elevated.  There is concern for more systemic process, including infection.  Prominent bullae may be 2/2 Staph or Strep toxin.  Though bullae associated with these infections are more commonly flaccid, the thick palmar skin is less likely to rupture and can present as tense bullae.  As patient also does cocaine, this may represent early Levamisole toxicity before the vasculitis/purpura are visible.  Will stop prednisone out of concern for infectious process.  She admits to cocaine  use but denies IVDU, so we will consider TTE/TEE if blood cultures are positive for evaluation of septic emboli. [ ]  ANA [ ]  Hepatitis panel [ ]  HIV - Acetaminophen level normal [ ]  C4 complement for evaluation of SLE and angioedema - Clobetasol cream 0.05% TID - STOP prednisone 57m daily - Diphenhydramine 243mIV BID - Percocet/OxyIR 5-325 1-2 tablests q4h prn [ ]  Blood cultures - Clindamycin 900 mg IV q8h  Elevated Liver Enzymes [ ]  Abdominal ultrasound  [ ]  Hepatitis panel  AKI, resolved -Creatine of 1.69 on presentation - Normalized to 1.05 after IVF  Substance abuse -UDS positive for opiates, cocaine, benzodiazepines, and THC. Patient states that she last used cocaine approximately 1 week ago and occasionally smokes marijuana. She states has prescription for Klonapin. She was given 54m7morphine approximately 2 hours before UDS was collected. Patient reports she will drink alcohol approximately 3 times per week and drinks about a glass of wine when she does consume alcohol - Counseling  provided  Depression/Anxiety -clonazepam 1mg66mily prn -Hold wellbutrin  F/E/N:  -IVF bolus 1 L x 2 -Regular Diet  Code: Full  DVT PPx: Lovenox  Dispo: Disposition is deferred at this time, awaiting improvement of current medical problems.  Anticipated discharge in approximately 1-2 day(s).   The patient does have a current PCP (ChriErnest Haber) and [facility].  The patient does not have transportation limitations that hinder transportation to clinic appointments.  .Services Needed at time of discharge: Y = Yes, Blank = No PT:   OT:   RN:   Equipment:   Other:       NichIline Oven 10/04/2015, 10:47 AM

## 2015-10-04 NOTE — Discharge Summary (Signed)
Name: Erika Taylor MRN: [id number] DOB: 1986/07/26 29 y.o. PCP: Aldean Baker, MD  Date of Admission: 10/03/2015  1:22 PM Date of Discharge: 10/05/2015 Attending Physician: No att. providers found  Discharge Diagnosis: 1. Presumed Levamisole Vasculitis/Vasculopathy   Principal Problem:   Bullous rash Active Problems:   Elevated liver enzymes   AKI (acute kidney injury) (HCC)   Polysubstance abuse  Discharge Medications:   Medication List    TAKE these medications        clobetasol cream 0.05 %  Commonly known as:  TEMOVATE  Apply topically 2 (two) times daily. Apply to palms of hands only.     clonazePAM 1 MG tablet  Commonly known as:  KLONOPIN  Take 1 mg by mouth daily as needed for anxiety.     triamcinolone cream 0.1 %  Commonly known as:  KENALOG  Apply topically 4 (four) times daily. Do not apply to face or within skin folds.        Disposition and follow-up:   [name] was discharged from [facility] in Stable condition.  At the hospital follow up visit please address:  1.  Continued cocaine and other drug use, resolution of rash  2.  Labs / imaging needed at time of follow-up: Cr, LFTs, UA  3.  Pending labs/ test needing follow-up: blood cultures, ANA, ANCA, ENa, hepatitis panel, HIV, RPR  Follow-up Appointments: Follow-up Information    [facility]. Schedule an appointment as soon as possible for a visit in 2 weeks.   Why:  For check of lab work and eval of rash.  Dermatology referral if necessary      Discharge Instructions: Discharge Instructions    Diet - low sodium heart healthy    Complete by:  As directed      Increase activity slowly    Complete by:  As directed            Consultations:    Procedures Performed:  US Abdomen Complete  10/04/2015  CLINICAL DATA:  Elevated liver enzymes. EXAM: ULTRASOUND ABDOMEN COMPLETE COMPARISON:  None. FINDINGS: Gallbladder:  Multiple small gallstones collecting in the neck of the gallbladder measuring approximately 5 mm each and numbering approximately 6. No gallbladder wall thickening or pericholecystic fluid. Negative sonographic Murphy's sign. Common bile duct: Diameter: Normal at 4 mm. Liver: No focal lesion identified. Within normal limits in parenchymal echogenicity. IVC: No abnormality visualized. Pancreas: Visualized portion unremarkable. Spleen: Size and appearance within normal limits. Right Kidney: Length: 11.8 cm.  No hydronephrosis. Left Kidney: Length: 10.9 cm.  No hydronephrosis. Abdominal aorta: No aneurysm visualized. Other findings: None. IMPRESSION: 1. Cholelithiasis without evidence cholecystitis. 2. No liver abnormality by ultrasound. 3. No biliary obstruction by ultrasound Electronically Signed   By: Genevive Bi M.D.   On: 10/04/2015 12:05    2D Echo:   Cardiac Cath:   Admission HPI: Ms. Erika Taylor is a 29 y.o. female with no significant past medical history other than anxiety and depression who presents to the Emergency Department with swelling and blistering of her bilateral hands. States that she went to bed last night feeling her normal self and awoke this morning at approximately 4 AM with swelling and numbness in her fingers that she describes as "pins and needles times 10." States she has never experienced symptoms like this before and has no known allergies that she is aware of. She states she took  of Tylenol and went back to sleep  but woke up approximately 30-60 minutes later with worsening pain and so presented to the Emergency Department. She reports no new exposures to detergents, soaps, foods, or drinks. Denies being burned. Denies any contact with anything that could potentially be an offending agent. Her prescription medications are Wellbutrin, Klonapin, and Vitamin D. States she finished a course of antibiotics about 1 month ago for bacterial vaginosis. She does  not recall the exact name of medication but states that she remembers being told not to drink alcohol while on this medication. She lives at home with her boyfriend of 6 years and 27 year old son and denies any sick contacts or others having experienced similar symptoms. Patient does report getting a manicure yesterday but states they only worked on her nails and there were no moisturizers or lotions applied to her hands. Patient reports having a sore throat for a few days but otherwise reports no difficulty breathing, no dysphagia, shortness of breath. She denies any chest pain, N/V/D, urinary complaints. She rates her pain as a 9/10.  In the ED, patient received IV fluid bolus, Benadryl  IV, Pepcid  IV, SoluMedrol  IV, and Morphine  IV x 2.    Hospital Course by problem list: Principal Problem:   Bullous rash Active Problems:   Elevated liver enzymes   AKI (acute kidney injury) (HCC)   Polysubstance abuse   Presumed Levamisole Vasculitis/Vasculopathy: Pictures of rash were shown to a dermatologist at [facility], with concern for contact dermatitis.  Patient was given IV Solumedrol and Clobetasol cream in the ED.  Patient reported no improvement and possible worsening of symptoms by the following morning.  As patient's rash did not itch and she had systemic signs of elevated LFTs and Cr, there was concern for possible infectious process causing bullous reaction.  Prednisone was discontinued, blood cultures taken, and patient was started on Clindamycin 900 mg IV TID.  Overnight, patient repeatedly expressed desire to leave the hospital.  Re-evaluation of rash the following morning showed rupture of bullae on the right hand, with darker, nonblanchable purpuric component. Her LFTs were trending down and Cr returned to baseline.  Findings were thought to be due to early Levamisole toxicity, further supported by blood and protein in urine on presentation.  She was counseled about the possible  effects of continued cocaine use. She adamantly expressed that she would abstain from cocaine in the future, as well as tell her friends the same.   Discharge Vitals:   BP 114/73 mmHg  Pulse 74  Temp(Src) 98.4 F (36.9 C) (Oral)  Resp 17  Ht  (1.778 m)  Wt 172 lb 9.6 oz (78.291 kg)  BMI 24.77 kg/m2  SpO2 98%  Discharge Labs:  Results for orders placed or performed during the hospital encounter of 10/03/15 (from the past 24 hour(s))  Culture, blood (routine x 2)     Status: None (Preliminary result)   Collection Time: 10/04/15 12:15 PM  Result Value Ref Range   Specimen Description BLOOD LEFT ANTECUBITAL    Special Requests BOTTLES DRAWN AEROBIC AND ANAEROBIC 10CC    Culture NO GROWTH < 24 HOURS    Report Status PENDING   Culture, blood (routine x 2)     Status: None (Preliminary result)   Collection Time: 10/04/15 12:30 PM  Result Value Ref Range   Specimen Description BLOOD RIGHT ANTECUBITAL    Special Requests BOTTLES DRAWN AEROBIC AND ANAEROBIC 10CC    Culture NO GROWTH < 24 HOURS    Report  Status PENDING   CBC with Differential/Platelet     Status: Abnormal   Collection Time: 10/05/15  5:16 AM  Result Value Ref Range   WBC 14.0 (H) 4.0 - 10.5 K/uL   RBC 3.43 (L) 3.87 - 5.11 MIL/uL   Hemoglobin 11.1 (L) 12.0 - 15.0 g/dL   HCT 16.132.7 (L) 09.636.0 - 04.546.0 %   MCV 95.3 78.0 - 100.0 fL   MCH 32.4 26.0 - 34.0 pg   MCHC 33.9 30.0 - 36.0 g/dL   RDW 40.912.9 81.111.5 - 91.415.5 %   Platelets 255 150 - 400 K/uL   Neutrophils Relative % 71 %   Lymphocytes Relative 22 %   Monocytes Relative 7 %   Eosinophils Relative 0 %   Basophils Relative 0 %   Neutro Abs 9.9 (H) 1.7 - 7.7 K/uL   Lymphs Abs 3.1 0.7 - 4.0 K/uL   Monocytes Absolute 1.0 0.1 - 1.0 K/uL   Eosinophils Absolute 0.0 0.0 - 0.7 K/uL   Basophils Absolute 0.0 0.0 - 0.1 K/uL   WBC Morphology VACUOLATED NEUTROPHILS   Comprehensive metabolic panel     Status: Abnormal   Collection Time: 10/05/15  5:16 AM  Result Value Ref Range    Sodium 141 135 - 145 mmol/L   Potassium 4.0 3.5 - 5.1 mmol/L   Chloride 107 101 - 111 mmol/L   CO2 24 22 - 32 mmol/L   Glucose, Bld 134 (H) 65 - 99 mg/dL   BUN 11 6 - 20 mg/dL   Creatinine, Ser 7.820.96 0.44 - 1.00 mg/dL   Calcium 9.5 8.9 - 95.610.3 mg/dL   Total Protein 6.3 (L) 6.5 - 8.1 g/dL   Albumin 3.7 3.5 - 5.0 g/dL   AST 213394 (H) 15 - 41 U/L   ALT 165 (H) 14 - 54 U/L   Alkaline Phosphatase 32 (L) 38 - 126 U/L   Total Bilirubin 1.1 0.3 - 1.2 mg/dL   GFR calc non Af Amer >60 >60 mL/min   GFR calc Af Amer >60 >60 mL/min   Anion gap 10 5 - 15    Signed: Jana HalfNicholas A Shondell Poulson, MD 10/05/2015, 12:14 PM    Services Ordered on Discharge: none Equipment Ordered on Discharge: none

## 2015-10-04 NOTE — Progress Notes (Signed)
Patient approached me and ask what she should do about her medication if she goes home and she might need some pain medication, so I checked patient's chart if she is being discharged but has no order, instructed patient to go back to her room and will send her nurse so that the nurse can let the doctor know. I was called to speak to the patient as she wanted to go home and bring her son home, and do some other things and come back to finish her treatment. Patient was made aware that she is not allowed to leave unless if we have a discharge order and if she will go home against medical advise then she needs to sign the form.  Patient agreed to stay and requested for her IV to be reinserted.

## 2015-10-04 NOTE — Progress Notes (Signed)
Pt boyfriend and her son came, after giving the vicodin at around 2 am, the patient and her boyfriend left her son alone in his room unknowingly, didn't tell me that she left the room, I heard from other staff that she will go to first floor to get the car key, I called the security to find the patient and accdg to her son the plan is to leave the son here with the patient I told her before that she cannot leave her son without accompanied by adult, still waiting for her to come back, accdg to the security she was chasing her BF car.

## 2015-10-04 NOTE — Progress Notes (Signed)
Pt informed that she can ambulate in the hallway but if she wants to go outside she need to inform me so that it won't happened again about last night that I need to call the security just to find her because she eave her son alone in her room.

## 2015-10-04 NOTE — Progress Notes (Signed)
MD made aware of patient behavior. Will monitor.

## 2015-10-04 NOTE — Progress Notes (Addendum)
Per staff patient went to the nursing desk asking about going home and some pills. Went to patient room, IV out and patient stating the lab tech pulled it out, inform patient that lab tech do not do that without informing nurse..This would be the second time patient pulled out IV, explained to the patient that we will start IV antibiotics, patient stating she needs to bring her son home, do her school work and need to go to work. Explained to the patient that it does not work that way. We need to start the IV antibiotic then Md will reevaluate patient, patient very upset stating she needs to do her things. Ask if a family could help but she stated she do not have a family. Charge nurse made aware, will address need appropriately.

## 2015-10-04 NOTE — Progress Notes (Signed)
Pt came back escorted by security, she was in parking lot [place],  I told her not to leave her son alone in his room, she apologized and she said she will call her mother to stay with her son.

## 2015-10-05 DIAGNOSIS — R2231 Localized swelling, mass and lump, right upper limb: Secondary | ICD-10-CM | POA: Insufficient documentation

## 2015-10-05 DIAGNOSIS — L139 Bullous disorder, unspecified: Secondary | ICD-10-CM | POA: Diagnosis not present

## 2015-10-05 DIAGNOSIS — Z720 Tobacco use: Secondary | ICD-10-CM | POA: Insufficient documentation

## 2015-10-05 DIAGNOSIS — F329 Major depressive disorder, single episode, unspecified: Secondary | ICD-10-CM | POA: Insufficient documentation

## 2015-10-05 DIAGNOSIS — F1910 Other psychoactive substance abuse, uncomplicated: Secondary | ICD-10-CM

## 2015-10-05 DIAGNOSIS — Z79899 Other long term (current) drug therapy: Secondary | ICD-10-CM | POA: Insufficient documentation

## 2015-10-05 LAB — HEPATITIS PANEL, ACUTE
HEP B S AG: NEGATIVE
Hep A IgM: NEGATIVE
Hep B C IgM: NEGATIVE

## 2015-10-05 LAB — CBC WITH DIFFERENTIAL/PLATELET
Basophils Absolute: 0 10*3/uL (ref 0.0–0.1)
Basophils Relative: 0 %
EOS ABS: 0 10*3/uL (ref 0.0–0.7)
EOS PCT: 0 %
HCT: 32.7 % — ABNORMAL LOW (ref 36.0–46.0)
HEMOGLOBIN: 11.1 g/dL — AB (ref 12.0–15.0)
LYMPHS PCT: 22 %
Lymphs Abs: 3.1 10*3/uL (ref 0.7–4.0)
MCH: 32.4 pg (ref 26.0–34.0)
MCHC: 33.9 g/dL (ref 30.0–36.0)
MCV: 95.3 fL (ref 78.0–100.0)
Monocytes Absolute: 1 10*3/uL (ref 0.1–1.0)
Monocytes Relative: 7 %
NEUTROS PCT: 71 %
Neutro Abs: 9.9 10*3/uL — ABNORMAL HIGH (ref 1.7–7.7)
Platelets: 255 10*3/uL (ref 150–400)
RBC: 3.43 MIL/uL — ABNORMAL LOW (ref 3.87–5.11)
RDW: 12.9 % (ref 11.5–15.5)
WBC: 14 10*3/uL — ABNORMAL HIGH (ref 4.0–10.5)

## 2015-10-05 LAB — COMPREHENSIVE METABOLIC PANEL
ALT: 165 U/L — AB (ref 14–54)
AST: 394 U/L — AB (ref 15–41)
Albumin: 3.7 g/dL (ref 3.5–5.0)
Alkaline Phosphatase: 32 U/L — ABNORMAL LOW (ref 38–126)
Anion gap: 10 (ref 5–15)
BUN: 11 mg/dL (ref 6–20)
CO2: 24 mmol/L (ref 22–32)
CREATININE: 0.96 mg/dL (ref 0.44–1.00)
Calcium: 9.5 mg/dL (ref 8.9–10.3)
Chloride: 107 mmol/L (ref 101–111)
GFR calc non Af Amer: >60 mL/min (ref >60)
Glucose, Bld: 134 mg/dL — ABNORMAL HIGH (ref 65–99)
Potassium: 4 mmol/L (ref 3.5–5.1)
SODIUM: 141 mmol/L (ref 135–145)
Total Bilirubin: 1.1 mg/dL (ref 0.3–1.2)
Total Protein: 6.3 g/dL — ABNORMAL LOW (ref 6.5–8.1)

## 2015-10-05 LAB — HIV ANTIBODY (ROUTINE TESTING W REFLEX): HIV Screen 4th Generation wRfx: NONREACTIVE

## 2015-10-05 MED ORDER — CLOBETASOL PROPIONATE 0.05 % EX CREA: TOPICAL_CREAM | Freq: Two times a day (BID) | CUTANEOUS | Status: DC

## 2015-10-05 MED ORDER — TRIAMCINOLONE ACETONIDE 0.1 % EX CREA: TOPICAL_CREAM | Freq: Four times a day (QID) | CUTANEOUS | Status: DC

## 2015-10-05 MED ORDER — CLONAZEPAM 0.5 MG PO TBDP: 1.0000 mg | ORAL_TABLET | Freq: Once | ORAL | Status: DC

## 2015-10-05 NOTE — Progress Notes (Signed)
Ms. Erika Taylor wanted to leave against medical advice so that she could go home, retrieve her clothes, and take a shower for [place] uncle[place] in [place], [place] at 1:00 pm.  When I arrived to the room, she was wearing street clothes, appeared anxious, and was pacing. I examined her hands and face, and noted that they had not significantly improved based on the photographs taken on 10/29.  I advised her against that course of action since a discharge plan had not yet been formulated, and it is still unclear what is causing her bullae and rash. I emphasized list of potential causes could be life-threatening in certain situations. I told her to await evaluation by the primary team this morning to make a determination on whether or not she was safe for discharge. I made no promises on whether she would be discharged today. She had agreed to stay at least until the day team evaluated her.

## 2015-10-05 NOTE — Discharge Instructions (Signed)
1. Stop using cocaine.  It can cause your rash to recur, with possibly worse effects each time.  It is due to Levamisole, which is an ingredient used to cut cocaine and crack. Continued use may result in worsened rash, including rotting of ears and nose. 2. Apply clobetasol cream to the palms of your hands.  Do not apply to any other part of your body. 3. Apply triamcinolone to your arms or legs.  Do not apply to face or where skin is opposed.

## 2015-10-05 NOTE — Progress Notes (Signed)
DC home, pt instructed to pick up Rx in [place] and told to return to office in 2 weeks. Pt was very anxious and would not let me go over entire packet.

## 2015-10-05 NOTE — Progress Notes (Signed)
Pt wants to go to school for 30 min and be back to hospital I told her I don't think it will be possible but let me talk to your MD, pt wants to do AMA, charge nurse informed .

## 2015-10-05 NOTE — Progress Notes (Signed)
Subjective: Erika Taylor. Two of the bullae ruptured draining serous fluid.  She has tried to leave multiple times overnight, never stating why she needed to leave so urgently.    Objective: Vital signs in last 24 hours: Filed Vitals:   10/04/15 0543 10/04/15 1430 10/04/15 2128 10/05/15 0503  BP: 123/77 118/63 124/71 114/73  Pulse: 87 78 80 74  Temp: 98.6 F (37 C) 97.9 F (36.6 C) 98 F (36.7 C) 98.4 F (36.9 C)  TempSrc: Oral Oral Oral Oral  Resp: 17 17 18 17   Height:      Weight:      SpO2: 96% 98% 98% 98%   Weight change:   Intake/Output Summary (Last 24 hours) at 10/05/15 6384 Last data filed at 10/05/15 0503  Gross per 24 hour  Intake    220 ml  Output      0 ml  Net    220 ml   Physical Exam  Constitutional: She is oriented to person, place, and time and well-developed, well-nourished, and in no distress.  Uncomfortable, but no distress.  HENT:  Head: Normocephalic and atraumatic.  No pharyngeal or buccal lesions.  No eye lesions.  Eyes: Conjunctivae and EOM are normal. No scleral icterus.  Neck: No tracheal deviation present.  Cardiovascular: Normal rate, regular rhythm, normal heart sounds and intact distal pulses.   Pulmonary/Chest: Effort normal and breath sounds normal. No respiratory distress. She has no wheezes.  Abdominal: Soft. She exhibits no distension. There is no tenderness. There is no rebound and no guarding.  Musculoskeletal:  Prominent non pitting swelling of dorsal hands.  Neurological: She is alert and oriented to person, place, and time.  Skin: Skin is warm and dry.  Multiple bullae present on bilateral palms and left hand 4th-5th finger webspace.  Linear vesicles apparent on left forearm.  Bullae and vesicles present on background nonblanching, retiform erythema.  Smaller 1-2 mm vesicles to left anterior forehead with underlying erythema.  Erythematous macules on left shoulder and left breast.         Lab Results: Basic Metabolic  Panel:  Recent Labs Lab 10/04/15 0605 10/05/15 0516  NA 135 141  K 4.0 4.0  CL 101 107  CO2 25 24  GLUCOSE 119* 134*  BUN 8 11  CREATININE 1.05* 0.96  CALCIUM 9.2 9.5   Liver Function Tests:  Recent Labs Lab 10/04/15 0605 10/05/15 0516  AST 530* 394*  ALT 183* 165*  ALKPHOS 34* 32*  BILITOT 0.6 1.1  PROT 6.7 6.3*  ALBUMIN 3.9 3.7   No results for input(s): LIPASE, AMYLASE in the last 168 hours. No results for input(s): AMMONIA in the last 168 hours. CBC:  Recent Labs Lab 10/03/15 1345 10/04/15 0605 10/05/15 0516  WBC 13.7* 14.6* 14.0*  NEUTROABS 10.1*  --  9.9*  HGB 13.8 11.6* 11.1*  HCT 41.4 35.4* 32.7*  MCV 95.6 96.2 95.3  PLT 339 303 255   Cardiac Enzymes: No results for input(s): CKTOTAL, CKMB, CKMBINDEX, TROPONINI in the last 168 hours. BNP: No results for input(s): PROBNP in the last 168 hours. D-Dimer: No results for input(s): DDIMER in the last 168 hours. CBG: No results for input(s): GLUCAP in the last 168 hours. Hemoglobin A1C: No results for input(s): HGBA1C in the last 168 hours. Fasting Lipid Panel: No results for input(s): CHOL, HDL, LDLCALC, TRIG, CHOLHDL, LDLDIRECT in the last 168 hours. Thyroid Function Tests: No results for input(s): TSH, T4TOTAL, FREET4, T3FREE, THYROIDAB in the last 168 hours. Coagulation:  No results for input(s): LABPROT, INR in the last 168 hours. Anemia Panel: No results for input(s): VITAMINB12, FOLATE, FERRITIN, TIBC, IRON, RETICCTPCT in the last 168 hours. Urine Drug Screen: Drugs of Abuse     Component Value Date/Time   LABOPIA POSITIVE* 10/03/2015 1621   COCAINSCRNUR POSITIVE* 10/03/2015 1621   LABBENZ POSITIVE* 10/03/2015 1621   AMPHETMU NONE DETECTED 10/03/2015 1621   THCU POSITIVE* 10/03/2015 1621   LABBARB NONE DETECTED 10/03/2015 1621    Alcohol Level: No results for input(s): ETH in the last 168 hours. Urinalysis:  Recent Labs Lab 10/03/15 1621  COLORURINE AMBER*  LABSPEC 1.019   PHURINE 5.5  GLUCOSEU NEGATIVE  HGBUR LARGE*  BILIRUBINUR NEGATIVE  KETONESUR NEGATIVE  PROTEINUR 30*  UROBILINOGEN 0.2  NITRITE NEGATIVE  LEUKOCYTESUR SMALL*   Misc. Labs:   Micro Results: Recent Results (from the past 240 hour(s))  Culture, blood (routine x 2)     Status: None (Preliminary result)   Collection Time: 10/04/15 12:15 PM  Result Value Ref Range Status   Specimen Description BLOOD LEFT ANTECUBITAL  Final   Special Requests BOTTLES DRAWN AEROBIC AND ANAEROBIC 10CC  Final   Culture NO GROWTH < 24 HOURS  Final   Report Status PENDING  Incomplete  Culture, blood (routine x 2)     Status: None (Preliminary result)   Collection Time: 10/04/15 12:30 PM  Result Value Ref Range Status   Specimen Description BLOOD RIGHT ANTECUBITAL  Final   Special Requests BOTTLES DRAWN AEROBIC AND ANAEROBIC 10CC  Final   Culture NO GROWTH < 24 HOURS  Final   Report Status PENDING  Incomplete   Studies/Results: US Abdomen Complete  10/04/2015  CLINICAL DATA:  Elevated liver enzymes. EXAM: ULTRASOUND ABDOMEN COMPLETE COMPARISON:  None. FINDINGS: Gallbladder: Multiple small gallstones collecting in the neck of the gallbladder measuring approximately 5 mm each and numbering approximately 6. No gallbladder wall thickening or pericholecystic fluid. Negative sonographic Murphy's sign. Common bile duct: Diameter: Normal at 4 mm. Liver: No focal lesion identified. Within normal limits in parenchymal echogenicity. IVC: No abnormality visualized. Pancreas: Visualized portion unremarkable. Spleen: Size and appearance within normal limits. Right Kidney: Length: 11.8 cm.  No hydronephrosis. Left Kidney: Length: 10.9 cm.  No hydronephrosis. Abdominal aorta: No aneurysm visualized. Other findings: None. IMPRESSION: 1. Cholelithiasis without evidence cholecystitis. 2. No liver abnormality by ultrasound. 3. No biliary obstruction by ultrasound Electronically Signed   By: Suzy Bouchard M.D.   On: 10/04/2015  12:05   Medications: I have reviewed the patient's current medications. Scheduled Meds: . clobetasol cream   Topical BID  . clonazepam  1 mg Oral Once  . diphenhydrAMINE  25 mg Intravenous BID  . enoxaparin (LOVENOX) injection  40 mg Subcutaneous Q24H  . nicotine  14 mg Transdermal Daily  . triamcinolone cream   Topical QID   Continuous Infusions:  PRN Meds:.clonazePAM, oxyCODONE, oxyCODONE-acetaminophen Assessment/Plan: Principal Problem:   Bullous rash Active Problems:   Elevated liver enzymes   AKI (acute kidney injury) ([place])   Polysubstance abuse  Ms. Creta Dorame is a 29 y.o. female with no significant past medical history other than anxiety and depression who presents to the Emergency Department with swelling and blistering of her bilateral hands.  Presumed Levamisole Vasculitis/Vasculopathy: acute onset of swelling of bilateral hands with rash and tense bullae to bilateral hands, left wrist, left shoulder, left chest, and left forehead. Labs that are abnormal include a creatine of 1.69 (Cr from 2013 were ~0.80), WBC count of 13.7  with 73% PMNs (absolute PMNs 10.1), AST 564 (16 in 2013), ALT 175 (13 in 2013), CRP 7.8. ESR is normal, alk phos 37. Urine with large blood and protein. UDS +. Vital signs stable, patient afebrile. ED provider consulted [facility] Dermatologist (Dr. Shary Decamp) and sent a series of pictures. Felt that this was likely a contact dermatitis, however, unclear what the source would be. They recommended clobetasol 0.05% TID x 3 days with transition to OTC hydrocortisone cream. Patient reports no improvement in swelling, bullae, or pain after IV Solumedrol or Clobetasol.  Though she has linear vesicles, none of the vesicles/bullae itch, which is very inconsistent with contact dermatitis.  Cr corrected with IVF, but WBC increasing and AST/ALT remaining elevated.  There is concern for more systemic process, including infection.  Prominent bullae may be 2/2 Staph  or Strep toxin.  Though bullae associated with these infections are more commonly flaccid, the thick palmar skin is less likely to rupture and can present as tense bullae.  Patient also had no improvement with IV Clindamycin for one day.  She never had SIRS signs concerning for systemic infection, and antibiotics were stopped.  As patient also does cocaine, had liver and kidney dysfunction, and rash appeared more purpuric the following morning, rash is felt to represent early Levamisole toxicity.   Patient was counseled on the risks of continued cocaine use.  She stated that she would never be using it again and would be telling her friends the same. [ ]  ANA, ENa, ANCA [ ]  Hepatitis panel [ ]  HIV, RPR - Acetaminophen level normal [ ]  C4 complement for evaluation of SLE and angioedema - Clobetasol cream 0.05% TID - STOP prednisone 29m daily - Diphenhydramine 2344mIV BID - Percocet/OxyIR 5-325 1-2 tablests q4h prn [ ]  Blood cultures NGd1  Elevated Liver Enzymes: Abdominal ultrasound normal [ ]  Hepatitis panel  AKI, resolved -Creatine of 1.69 on presentation - Normalized to 1.05 after IVF  Substance abuse -UDS positive for opiates, cocaine, benzodiazepines, and THC. Patient states that she last used cocaine approximately 1 week ago and occasionally smokes marijuana. She states has prescription for Klonapin. She was given 44m70morphine approximately 2 hours before UDS was collected. Patient reports she will drink alcohol approximately 3 times per week and drinks about a glass of wine when she does consume alcohol - Counseling provided  Depression/Anxiety -clonazepam 1mg67mily prn -Hold wellbutrin  F/E/N:  -IVF bolus 1 L x 2 -Regular Diet  Code: Full  DVT PPx: Lovenox  Dispo: Disposition is deferred at this time, awaiting improvement of current medical problems.  Anticipated discharge in approximately 1-2 day(s).   The patient does have a current PCP (ChriErnest Haber) and  does need an [facility] follow-[facility] appointment after discharge.  The patient does not have transportation limitations that hinder transportation to clinic appointments.  .Services Needed at time of discharge: Y = Yes, Blank = No PT:   OT:   RN:   Equipment:   Other:     LOS: 1 day   NichIline Oven 10/05/2015, 9:22 AM

## 2015-10-06 ENCOUNTER — Encounter (HOSPITAL_COMMUNITY): Payer: Self-pay | Admitting: Emergency Medicine

## 2015-10-06 ENCOUNTER — Encounter: Payer: Self-pay | Admitting: Internal Medicine

## 2015-10-06 ENCOUNTER — Ambulatory Visit (INDEPENDENT_AMBULATORY_CARE_PROVIDER_SITE_OTHER): Payer: Medicaid Other | Admitting: Internal Medicine

## 2015-10-06 ENCOUNTER — Emergency Department (HOSPITAL_COMMUNITY)
Admission: EM | Admit: 2015-10-06 | Discharge: 2015-10-06 | Disposition: A | Discharge status: Home / Self Care | Payer: Medicaid Other | Attending: Emergency Medicine | Admitting: Emergency Medicine

## 2015-10-06 VITALS — BP 121/59 | HR 72 | Temp 97.7°F | Ht 69.5 in | Wt 176.2 lb

## 2015-10-06 DIAGNOSIS — R21 Rash and other nonspecific skin eruption: Secondary | ICD-10-CM

## 2015-10-06 DIAGNOSIS — M7989 Other specified soft tissue disorders: Secondary | ICD-10-CM | POA: Diagnosis not present

## 2015-10-06 LAB — CBC WITH DIFFERENTIAL/PLATELET
BASOS ABS: 0 10*3/uL (ref 0.0–0.1)
BASOS PCT: 0 %
Eosinophils Absolute: 0.1 10*3/uL (ref 0.0–0.7)
Eosinophils Relative: 1 %
HEMATOCRIT: 34.4 % — AB (ref 36.0–46.0)
HEMOGLOBIN: 11.7 g/dL — AB (ref 12.0–15.0)
LYMPHS PCT: 43 %
Lymphs Abs: 4.1 10*3/uL — ABNORMAL HIGH (ref 0.7–4.0)
MCH: 32.4 pg (ref 26.0–34.0)
MCHC: 34 g/dL (ref 30.0–36.0)
MCV: 95.3 fL (ref 78.0–100.0)
MONO ABS: 0.6 10*3/uL (ref 0.1–1.0)
Monocytes Relative: 6 %
NEUTROS ABS: 4.7 10*3/uL (ref 1.7–7.7)
NEUTROS PCT: 50 %
Platelets: 314 10*3/uL (ref 150–400)
RBC: 3.61 MIL/uL — AB (ref 3.87–5.11)
RDW: 12.6 % (ref 11.5–15.5)
WBC: 9.5 10*3/uL (ref 4.0–10.5)

## 2015-10-06 LAB — ANCA TITERS
Atypical P-ANCA titer: <1:20 {titer}
P-ANCA: <1:20 {titer}

## 2015-10-06 LAB — BASIC METABOLIC PANEL
ANION GAP: 7 (ref 5–15)
BUN: 13 mg/dL (ref 6–20)
CALCIUM: 9.5 mg/dL (ref 8.9–10.3)
CO2: 28 mmol/L (ref 22–32)
Chloride: 106 mmol/L (ref 101–111)
Creatinine, Ser: 1.05 mg/dL — ABNORMAL HIGH (ref 0.44–1.00)
GLUCOSE: 90 mg/dL (ref 65–99)
POTASSIUM: 3.2 mmol/L — AB (ref 3.5–5.1)
Sodium: 141 mmol/L (ref 135–145)

## 2015-10-06 LAB — ANTINUCLEAR ANTIBODIES, IFA: ANA Ab, IFA: NEGATIVE

## 2015-10-06 LAB — SEDIMENTATION RATE: Sed Rate: 23 mm/hr — ABNORMAL HIGH (ref 0–22)

## 2015-10-06 LAB — MPO/PR-3 (ANCA) ANTIBODIES: ANCA Proteinase 3: <3.5 U/mL (ref 0.0–3.5)

## 2015-10-06 LAB — C-REACTIVE PROTEIN: CRP: 1 mg/dL — AB (ref <1.0)

## 2015-10-06 LAB — RPR: RPR: NONREACTIVE

## 2015-10-06 LAB — ANTIEXTRACTABLE NUCLEAR AG: Ribonucleic Protein: 0.2 AI (ref 0.0–0.9)

## 2015-10-06 MED ORDER — DOXYCYCLINE HYCLATE 100 MG PO TABS: 100.0000 mg | ORAL_TABLET | Freq: Two times a day (BID) | ORAL | Status: DC

## 2015-10-06 MED ORDER — OXYCODONE HCL 5 MG PO TABS: 5.0000 mg | ORAL_TABLET | Freq: Three times a day (TID) | ORAL | Status: DC | PRN

## 2015-10-06 MED ORDER — PREDNISONE 20 MG PO TABS: 20.0000 mg | ORAL_TABLET | Freq: Every day | ORAL | Status: DC

## 2015-10-06 MED ORDER — HYDROCODONE-ACETAMINOPHEN 5-325 MG PO TABS
1.0000 | ORAL_TABLET | Freq: Once | ORAL | Status: AC
  Administered 2015-10-06: 1 via ORAL
  Filled 2015-10-06: qty 1

## 2015-10-06 NOTE — Progress Notes (Signed)
Internal Medicine Clinic Attending  I saw and evaluated the patient.  I personally confirmed the key portions of the history and exam documented by Dr. Yetta BarreJones and I reviewed pertinent patient test results.  The assessment, diagnosis, and plan were formulated together and I agree with the documentation in the resident's note.  This is a difficulty case of skin lesions on the hands that started as painful spontaneous bullae and now are progressive to tender ulcerations with associated edema and errythema of both hands. 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 Other serologies including HIV and viral hepatitis were normal. Vitals are stable, labs otherwise improving, hands are warm throughout. There is tender nonblanching errythema of both hands with swelling in the thenar eminence and along the fingers. No pain with passive movement of the fingers so I doubt deep tracking abscess. Differential still includes a cutaneous vasculitis or possibly cellulitis especially of the right hand. She will need supportive care now given her symptom progression, so we will try prednisone and doxycycline. She needs dermatology evaluation and probably biopsy ASAP which we will try to initiate.

## 2015-10-06 NOTE — Assessment & Plan Note (Signed)
Patient w/ 3 days of hand swelling and overlying erythema/bullous rash. 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 She was given a dose of Clindamycin and some IV steroids, however, she states that this did not help. Patient was seen again in the ED today for worsening pain and swelling and was again sent home. Patient states the pain and swelling started on 10/04/15 w/ overlying blisters filled w/ serous fluid. She denies any other pain and swelling anywhere else. No further rash or blisters, no oral lesions, fever, chills, foot involvement, joint pain, genital lesions, ocular complaints, diarrhea, cough, sore throat, cough, or sick contacts. Patient does admit to recent cocaine use, otherwise denies IVDU. The lesions on her hands are not pruritic. Pain she describes as numbness, tingling, and sharp shooting pain extending to her fingertips. On exam, she exhibits bilateral hand swelling, most significant over right thenar eminence w/ overlying erythema. Areas of ulceration in palmar creases bilaterally. Pulses intact distally. Scattered bullous lesions that are filled w/ serous fluid or are older and no longer tense. Some areas of scattered, blanchable erythema overlying dorsum of the hand. No obvious pallor of the fingertips, warm to the touch. Suspect this is still 2/2 vasculitis, however, now w/ significant overlying inflammation and questionable cellulitis. Unlikely to be erythema multiforme vs hand foot and mouth, as she does not have further lesions anywhere else. No sick contacts. Labs drawn in ED (ESR, CRP, CBC) show improvement. ANCA, ANA, RPR, negative, making SLE, Syphilis unlikely as well. Patient follows w/ regular PCP in [place], however, had 1-time follow up in [facility] after hospital admission to [facility].  -Will give Prednisone 40 mg daily for 5 days + Doxycycline 100 mg bid for 5 days -Oxy-IR 5 mg q6h prn for pain -Will advise follow up w/ PCP in [place]  in 3 days so patient can potentially be referred to Dermatology for possible biopsy if this continues to worsen or not improve. If she cannot get appointment at PCP, will follow up in [facility].  -Advised patient go to ED emergently if signs of compartment syndrome given swelling; pallor, decreased temp, or increased pain.

## 2015-10-06 NOTE — ED Provider Notes (Signed)
CSN: [id number]     Arrival date & time 10/05/15  2357 History   By signing my name below, I, Erika Taylor, attest that this documentation has been prepared under the direction and in the presence of Erika Grosser, MD.  Electronically Signed: Forrestine Taylor, ED Scribe. 10/06/2015. 1:03 AM.   Chief Complaint  Patient presents with  . hand swelling    The history is provided by the patient. No language interpreter was used.    HPI Comments: Erika Taylor is a 29 y.o. female who presents to the Emergency Department complaining of constant, ongoing, worsening bilateral hand swelling with mild numbness to fingertips x 3 days. Pt was seen at [facility] for same on 10/29 and was admitted. Steroids, fluids, and antibiotics given during her stay. [name] was discharged today but states discomfort and swelling has worsened significantly. No OTC medications or home remedies attempted since discharge. No recent fever, chills, chest pain, shortness of breath, nausea, or vomiting. Pt is otherwise healthy without any pertinent medical issues. However, she is currently on Wellbutrin for history of depression.  PCP: Erika Haber, MD    Past Medical History  Diagnosis Date  . Depression    History reviewed. No pertinent past surgical history. History reviewed. No pertinent family history. Social History  Substance Use Topics  . Smoking status: Current Every Day Smoker  . Smokeless tobacco: None  . Alcohol Use: No   OB History    No data available     Review of Systems  Constitutional: Negative for fever and chills.  Respiratory: Negative for cough and shortness of breath.   Cardiovascular: Negative for chest pain and leg swelling.  Gastrointestinal: Negative for nausea, vomiting and abdominal pain.  Musculoskeletal: Positive for joint swelling.  Skin: Negative for rash.  Neurological: Negative for headaches.  Psychiatric/Behavioral: Negative for confusion.  All other systems reviewed  and are negative.     Allergies  Review of patient's allergies indicates no known allergies.  Home Medications   Prior to Admission medications   Medication Sig Start Date End Date Taking? Authorizing Provider  buPROPion (WELLBUTRIN) 100 MG tablet Take 200 mg by mouth 2 (two) times daily.   Yes Historical Provider, MD  clobetasol cream (TEMOVATE) 0.05 % Apply topically 2 (two) times daily. Apply to palms of hands only. 10/05/15  Yes Iline Oven, MD  clonazePAM (KLONOPIN) 1 MG tablet Take 1 mg by mouth daily as needed for anxiety.   Yes Historical Provider, MD  triamcinolone cream (KENALOG) 0.1 % Apply topically 4 (four) times daily. Do not apply to face or within skin folds. 10/05/15  Yes Iline Oven, MD   Triage Vitals: BP 128/75 mmHg  Pulse 88  Temp(Src) 97.5 F (36.4 C) (Oral)  Resp 15  SpO2 99%   Physical Exam  Constitutional: She is oriented to person, place, and time. She appears well-developed and well-nourished. No distress.  HENT:  Head: Normocephalic and atraumatic.  Eyes: EOM are normal.  Neck: Normal range of motion.  Cardiovascular: Normal rate, regular rhythm and normal heart sounds.   Pulmonary/Chest: Effort normal and breath sounds normal.  Abdominal: Soft. She exhibits no distension. There is no tenderness.  Musculoskeletal: Normal range of motion. She exhibits edema and tenderness.  Ruptured bulla over R thenar eminence Unruptured bulla over L palm Swelling of bilateral radial hands with pain in radial and median nerve distributions   Neurological: She is alert and oriented to person, place, and time.  Skin: Skin is  warm and dry.  Psychiatric: She has a normal mood and affect. Judgment normal.  Nursing note and vitals reviewed.       ED Course  Procedures (including critical care time)  DIAGNOSTIC STUDIES: Oxygen Saturation is 99% on RA, Normal by my interpretation.    COORDINATION OF CARE: 12:59 AM- Will order C-reactive protein,  sedimentation rate, CBC, and BMP. Discussed treatment plan with pt at bedside and pt agreed to plan.     Labs Review Labs Reviewed  CBC WITH DIFFERENTIAL/PLATELET - Abnormal; Notable for the following:    RBC 3.61 (*)    Hemoglobin 11.7 (*)    HCT 34.4 (*)    Lymphs Abs 4.1 (*)    All other components within normal limits  BASIC METABOLIC PANEL - Abnormal; Notable for the following:    Potassium 3.2 (*)    Creatinine, Ser 1.05 (*)    All other components within normal limits  SEDIMENTATION RATE - Abnormal; Notable for the following:    Sed Rate 23 (*)    All other components within normal limits  C-REACTIVE PROTEIN    Imaging Review US Abdomen Complete  10/04/2015  CLINICAL DATA:  Elevated liver enzymes. EXAM: ULTRASOUND ABDOMEN COMPLETE COMPARISON:  None. FINDINGS: Gallbladder: Multiple small gallstones collecting in the neck of the gallbladder measuring approximately 5 mm each and numbering approximately 6. No gallbladder wall thickening or pericholecystic fluid. Negative sonographic Murphy's sign. Common bile duct: Diameter: Normal at 4 mm. Liver: No focal lesion identified. Within normal limits in parenchymal echogenicity. IVC: No abnormality visualized. Pancreas: Visualized portion unremarkable. Spleen: Size and appearance within normal limits. Right Kidney: Length: 11.8 cm.  No hydronephrosis. Left Kidney: Length: 10.9 cm.  No hydronephrosis. Abdominal aorta: No aneurysm visualized. Other findings: None. IMPRESSION: 1. Cholelithiasis without evidence cholecystitis. 2. No liver abnormality by ultrasound. 3. No biliary obstruction by ultrasound Electronically Signed   By: Suzy Bouchard M.D.   On: 10/04/2015 12:05   I have personally reviewed and evaluated these images and lab results as part of my medical decision-making.   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 No fevers, no extension of rash proximal to hands, has begun having desquamation of the area. Not on antibiotics currently. Using topical steroids as instructed. Labs drawn in notable for minimal elevation of ESR, no leukocytosis, appropriate renal function. Discussed case with on-call internal medicine resident from discharging service who will arrange for expedited follow-up in their clinic during the day to recheck. Patient was in agreement with plan to continue with outpatient treatment as she did not want to be admitted. Good distal circulation and sensation is present. Plan to follow up with PCP as needed and return precautions discussed for worsening or new concerning symptoms.   I personally performed the services described in this documentation, which was scribed in my presence. The recorded information has been reviewed and is accurate.    Erika Grosser, MD 10/06/15 [phone]

## 2015-10-06 NOTE — ED Notes (Signed)
Pt states on Saturday morning she started having blistering on her hands and swelling  [facility] and has been receiving steroids and fluids and antibiotics  Pt was discharged today and states the swelling and pain have gotten much worse  Pt states she cannot feel her fingertips  Pt has some discoloration noted to her right hand

## 2015-10-06 NOTE — Patient Instructions (Signed)
1. Please make a follow up appointment for 3 days.  2. Please take all medications as previously prescribed with the following changes:  Start taking Doxycycline 100 mg twice daily for 5 days + Prednisone 40 mg (2 tabs) once daily for 5 days.  3. If you have worsening of your symptoms or new symptoms arise, please call the clinic ([phone]), or go to the ER immediately if symptoms are severe.  IF INCREASED PAIN, COOL FINGERTIPS, OR CHANGE IN COLOR, COME TO THE ED FOR SURGICAL EVALUATION.    Vasculitis Vasculitis is swelling (inflammation) of the blood vessels. With vasculitis, the blood vessels can become thick, narrow, scarred, or weak, and enough blood may not be able to flow through them. This can cause damage to the muscles, kidneys, lungs, brain, and other parts of the body.  There are many types of vasculitis. Some last only a short time while others last a long time. CAUSES  The exact cause is unknown, but vasculitis can develop when the body's immune system attacks its own blood vessels. This attack can be caused by:  An infection.  An immune system disease, such as lupus, rheumatoid arthritis, or scleroderma.  An allergic reaction to a medicine. RISK FACTORS  Being a smoker.  Being under stress.  Having a physical injury. SIGNS AND SYMPTOMS  Symptoms vary depending on the type of vasculitis you have. Symptoms that are common to all types of vasculitis include:  Fever.  Poor appetite.  Weight loss.  Feeling very tired.  Having aches and pains.  Weakness.  Numbness in an area of your body. Symptoms for specific types of vasculitis include:  Skin problems, such as sores, spots, or rashes.  Trouble seeing.  Trouble breathing.  Blood in your urine.  Headaches.  Stomach pain.  Stuffy or bloody nose. DIAGNOSIS  Your health care provider will ask about your symptoms and do a physical exam. You may have tests done, such as:  A complete blood count  (CBC).  Erythrocyte sedimentation, also called sed rate test.  C-reactive protein (CRP).  Antineutrophil cytoplasmic antibodies (ANCA).  A urine test.  A biopsy of a blood vessel.  A nerve conduction study.  Imaging tests, such as:  X-rays.  A CT scan.  An ultrasound.  An MRI.  Angiography. TREATMENT  Treatment will depend on the type of vasculitis you have and how severe it is. Sometimes treatment is not needed. Treatment often includes:  Medicines.  Physical therapy or occupational therapy. This helps strengthen muscles that were weakened by the disease. You will need to see your health care provider while you are being treated. During follow-up visits, your health care provider will:  Perform blood tests and bone density tests.  Check your blood pressure and blood sugar.  Check for side effects of any medicines you are taking. Vasculitis cannot always be cured. Sometimes symptoms go away but the disease does not (the disease goes in remission). If symptoms return, increased treatment may be needed. HOME CARE INSTRUCTIONS   Take medicines only as directed by your health care provider.  Keep all follow-up visits as directed by your health care provider. This is important.  Exercise. Talk with your health care provider about what exercises are okay for you to do. Usually exercises that increase your heart rate (aerobic exercise), such as walking, are recommended. Aerobic exercise helps control your blood pressure and prevent bone loss.  Follow a healthy diet. Include healthy sources of protein, fruits, vegetables, and whole grains  in your diet.  Learn as much as you can about vasculitis, and consider joining a support group. Understanding your condition and talking with others who have it may help you cope. Talk with your health care provider if you feel stressed, anxious, or depressed. SEEK MEDICAL CARE IF:   Your symptoms return, or you have new symptoms.  Your  fever, fatigue, headache, or weight loss gets worse.  You have signs of infection, such as fever, warmth, tenderness, redness, or swelling. SEEK IMMEDIATE MEDICAL CARE IF:   Your vision gets worse.  Your pain does not go away, even after you take pain medicine.  You have chest or stomach pain.  You have trouble breathing.  One side of your face or body suddenly becomes weak or numb.  Your nose bleeds.  There is blood in your urine. MAKE SURE YOU:  Understand these instructions.  Will watch your condition.  Will get help right away if you are not doing well or get worse.   This information is not intended to replace advice given to you by your health care provider. Make sure you discuss any questions you have with your health care provider.   Document Released: 09/17/2009 Document Revised: 12/12/2014 Document Reviewed: 01/15/2014 Elsevier Interactive Patient Education [employer]2016 [employer] Inc.

## 2015-10-06 NOTE — Discharge Instructions (Signed)

## 2015-10-06 NOTE — Progress Notes (Signed)
Subjective:   Patient ID: Erika Taylor female   DOB: [birth year] 29 y.o.   MRN: [id number]  HPI: Ms. Erika Taylor is a 29 y.o. female w/ PMHx of depression, presents to the clinic today for a hospital follow up visit after admission from 10/04/15 to 10/05/15 for bilateral hand swelling w/ overlying bullous lesions. She was given a dose of Clindamycin and some IV steroids, however, she states that this did not help. Patient was seen again in the ED today for worsening pain and swelling and was again sent home.  Patient states the pain and swelling started on 10/04/15 w/ overlying blisters filled w/ serous fluid. She denies any other pain and swelling anywhere else. No further rash or blisters, no oral lesions, fever, chills, foot involvement, joint pain, genital lesions, ocular complaints, diarrhea, cough, sore throat, cough, or sick contacts. Patient does admit to recent cocaine use, otherwise denies IVDU. The lesions on her hands are not pruritic. Pain she describes as numbness, tingling, and sharp shooting pain extending to her fingertips. Fingers are warm to the touch.   Past Medical History  Diagnosis Date  . Depression    Current Outpatient Prescriptions  Medication Sig Dispense Refill  . buPROPion (WELLBUTRIN) 100 MG tablet Take 200 mg by mouth 2 (two) times daily.    . clobetasol cream (TEMOVATE) 0.05 % Apply topically 2 (two) times daily. Apply to palms of hands only. 30 g 0  . clonazePAM (KLONOPIN) 1 MG tablet Take 1 mg by mouth daily as needed for anxiety.    Marland Kitchen doxycycline (VIBRA-TABS) 100 MG tablet Take 1 tablet (100 mg total) by mouth 2 (two) times daily. 10 tablet 0  . oxyCODONE (ROXICODONE) 5 MG immediate release tablet Take 1 tablet (5 mg total) by mouth every 8 (eight) hours as needed. 20 tablet 0  . predniSONE (DELTASONE) 20 MG tablet Take 1 tablet (20 mg total) by mouth daily with breakfast. 10 tablet 0  . triamcinolone cream (KENALOG) 0.1 % Apply topically 4 (four) times  daily. Do not apply to face or within skin folds. 45 g 0   No current facility-administered medications for this visit.   Review of Systems  General: Denies fever, diaphoresis, appetite change, and fatigue.  Respiratory: Denies SOB, cough, and wheezing.   Cardiovascular: Denies chest pain and palpitations.  Gastrointestinal: Denies nausea, vomiting, abdominal pain, and diarrhea Musculoskeletal: Positive for hand pain. Denies myalgias, arthralgias, back pain, and gait problem.  Neurological: Denies dizziness, syncope, weakness, lightheadedness, and headaches.  Psychiatric/Behavioral: Denies mood changes, sleep disturbance, and agitation.   Objective:   Physical Exam: Filed Vitals:   10/06/15 1424  BP: 121/59  Pulse: 72  Temp: 97.7 F (36.5 C)  TempSrc: Oral  Height: 5' 9.5" (1.765 m)  Weight: 176 lb 3.2 oz (79.924 kg)  SpO2: 100%    General: White female, alert, cooperative, NAD. HEENT: PERRL, EOMI. Moist mucus membranes. No oral lesions.  Neck: Full range of motion without pain, supple, no lymphadenopathy or carotid bruits Lungs: Clear to ascultation bilaterally, normal work of respiration, no wheezes, rales, rhonchi Heart: RRR, no murmurs, gallops, or rubs Abdomen: Soft, non-tender, non-distended, BS + Extremities: No cyanosis or clubbing. Bilateral hand swelling, most significant over right thenar eminence w/ overlying erythema. Ulcerated area in palmar creases bilaterally. Pulses intact distally. Scattered bullous lesions that are filled w/ serous fluid or are older and no longer tense. Some areas of scattered, blanchable erythema overlying dorsum of the hand. No obvious pallor of the  fingertips, warm to the touch.  Neurologic: Alert & oriented X3, cranial nerves II-XII intact, strength grossly intact, sensation intact to light touch   Assessment & Plan:   Please see problem based assessment and plan.

## 2015-10-06 NOTE — ED Notes (Signed)
Patient was sleeping.

## 2015-10-06 NOTE — ED Notes (Signed)
Patient stated she is not in pain and is ready to go home. MD notified.

## 2015-10-09 ENCOUNTER — Other Ambulatory Visit (HOSPITAL_COMMUNITY)
Admission: RE | Admit: 2015-10-09 | Discharge: 2015-10-09 | Disposition: A | Discharge status: Home / Self Care | Payer: Medicaid Other | Source: Ambulatory Visit | Attending: Student in an Organized Health Care Education/Training Program | Admitting: Student in an Organized Health Care Education/Training Program

## 2015-10-09 ENCOUNTER — Ambulatory Visit (INDEPENDENT_AMBULATORY_CARE_PROVIDER_SITE_OTHER): Payer: Medicaid Other | Admitting: Internal Medicine

## 2015-10-09 ENCOUNTER — Encounter: Payer: Self-pay | Admitting: Internal Medicine

## 2015-10-09 VITALS — BP 132/58 | HR 92 | Temp 97.6°F | Ht 69.5 in | Wt 175.0 lb

## 2015-10-09 DIAGNOSIS — R2231 Localized swelling, mass and lump, right upper limb: Secondary | ICD-10-CM | POA: Insufficient documentation

## 2015-10-09 DIAGNOSIS — R21 Rash and other nonspecific skin eruption: Secondary | ICD-10-CM

## 2015-10-09 DIAGNOSIS — M7989 Other specified soft tissue disorders: Secondary | ICD-10-CM

## 2015-10-09 LAB — CULTURE, BLOOD (ROUTINE X 2)
CULTURE: NO GROWTH
Culture: NO GROWTH

## 2015-10-09 MED ORDER — OXYCODONE HCL 5 MG PO TABS: 5.0000 mg | ORAL_TABLET | Freq: Three times a day (TID) | ORAL | Status: AC | PRN

## 2015-10-09 NOTE — Progress Notes (Signed)
Patient ID: Erika Taylor, female   DOB: 09/13/1986, 29 y.o.   MRN: [id number]   Subjective:   Patient ID: Erika Taylor female   DOB: 09/13/1986 29 y.o.   MRN: [id number]  HPI: Erika Taylor is a 29 y.o. female with a past medical history listed below here today for follow up of her bilateral hand swelling. She reports that the bullous lesions on her hands have resolved and are healing well. She continues to have marked edema in both of her hands ending at the wrists. She has limited use of her hands. She continues to have numbness, tingling and sharp shooting pains to her fingers. Sensation is intact and her fingers are warm with good blood flow.   Past Medical History  Diagnosis Date  . Depression    Current Outpatient Prescriptions  Medication Sig Dispense Refill  . buPROPion (WELLBUTRIN) 100 MG tablet Take 200 mg by mouth 2 (two) times daily.    . clobetasol cream (TEMOVATE) 0.05 % Apply topically 2 (two) times daily. Apply to palms of hands only. 30 g 0  . clonazePAM (KLONOPIN) 1 MG tablet Take 1 mg by mouth daily as needed for anxiety.    Marland Kitchen. doxycycline (VIBRA-TABS) 100 MG tablet Take 1 tablet (100 mg total) by mouth 2 (two) times daily. 10 tablet 0  . oxyCODONE (ROXICODONE) 5 MG immediate release tablet Take 1 tablet (5 mg total) by mouth every 8 (eight) hours as needed. 30 tablet 0  . predniSONE (DELTASONE) 20 MG tablet Take 1 tablet (20 mg total) by mouth daily with breakfast. 10 tablet 0  . triamcinolone cream (KENALOG) 0.1 % Apply topically 4 (four) times daily. Do not apply to face or within skin folds. 45 g 0   No current facility-administered medications for this visit.   No family history on file. Social History   Social History  . Marital Status: Single    Spouse Name: N/A  . Number of Children: N/A  . Years of Education: N/A   Social History Main Topics  . Smoking status: Current Every Day Smoker  . Smokeless tobacco: None  . Alcohol Use: No  . Drug Use:  No  . Sexual Activity: Yes    Birth Control/ Protection: IUD   Other Topics Concern  . None   Social History Narrative   Review of Systems: General: Denies fever, diaphoresis, appetite change, and fatigue.  Respiratory: Denies SOB, cough, and wheezing.  Cardiovascular: Denies chest pain and palpitations.  Gastrointestinal: Denies nausea, vomiting, abdominal pain, and diarrhea Musculoskeletal: Positive for hand pain. Denies myalgias, arthralgias, back pain, and gait problem.  Neurological: Denies dizziness, syncope, weakness, lightheadedness, and headaches.  Psychiatric/Behavioral: Denies mood changes, sleep disturbance, and agitation.  Objective:  Physical Exam: Filed Vitals:   10/09/15 1033  BP: 132/58  Pulse: 92  Temp: 97.6 F (36.4 C)  TempSrc: Oral  Height: 5' 9.5" (1.765 m)  Weight: 175 lb (79.379 kg)  SpO2: 100%   PHYSICAL EXAM General: White female, alert, cooperative, NAD. HEENT: PERRL, EOMI. Moist mucus membranes. No oral lesions.  Neck: Full range of motion without pain, supple, no lymphadenopathy or carotid bruits Lungs: Clear to ascultation bilaterally, normal work of respiration, no wheezes, rales, rhonchi Heart: RRR, no murmurs, gallops, or rubs Abdomen: Soft, non-tender, non-distended, BS + Extremities: No cyanosis or clubbing. Bilateral hand swelling, most significant over right thenar eminence w/ overlying erythema. Ulcerated area in palmar creases bilaterally. Pulses intact distally. No bullous lesions present. Some areas of scattered, blanchable  erythema overlying dorsum of the hand. No obvious pallor of the fingertips, warm to the touch.  Neurologic: Alert & oriented X3, cranial nerves II-XII intact, strength grossly intact, sensation intact to light touch  Assessment & Plan:   Case discussed with Dr. Oswaldo Done. Please refer to Problem based carting for further details of today's visit.

## 2015-10-09 NOTE — Assessment & Plan Note (Addendum)
She reports that the bullous lesions on her hands have resolved and are healing well. She continues to have marked edema in both of her hands ending at the wrists. She has limited use of her hands. She continues to have numbness, tingling and sharp shooting pains to her fingers. Sensation is intact and her fingers are warm with good blood flow.   After informed written consent was obtained, using Betadine for cleansing and 1% Lidocaine without epinephrine for anesthetic, with sterile technique a 4 mm punch biopsy was used to obtain a biopsy specimen of the lesion. Hemostasis was obtained by pressure and wound was not sutured. Band-Aid is applied, and wound care instructions provided. Be alert for any signs of cutaneous infection. The specimen is labeled and sent to pathology for evaluation. The procedure was well tolerated without complications.  -Finish Prednisone and Doxy course -Follow up in 1 week following punch biopsy results. -Refilled Oxy-IR 5 mg q6hr prn prescription -Continue to work on Dermatology referral

## 2015-10-09 NOTE — Patient Instructions (Signed)
Thank you for coming in today  -Just keep your hand clean and dry. There is no need to use peroxide on it. -Finish the course of Doxycycline and Prednisone you were given at your last visit  -If you have any questions or concerns please call the clinic [phone])  -Please follow up in 1 week. We should have the results of the biopsy done today by then. -If you develop any new blisters, your fingertips become cold or dark blue in color or the pain becomes uncontrolled please visit the ED for surgical evaluation

## 2015-10-13 NOTE — Progress Notes (Addendum)
Internal Medicine Clinic Attending  I saw and evaluated the patient.  I personally confirmed the key portions of the history and exam documented by Dr. Karma GreaserBoswell and I reviewed pertinent patient test results.  The assessment, diagnosis, and plan were formulated together and I agree with the documentation in the resident's note.  Overall both her hands appear improved slightly to me. The swelling is reducing. There is still some stellate blanching errythema on the dorsum of the hand and non-blanching errythema on the thenar eminences. She has been unable to arrange evaluation with dermatology, so we completed a punch biopsy of the right thenar eminence because this had the most active swelling and errythema. 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 We will finish prednisone course and have her return to clinic in one week for recheck.   I agree with the pre-procedure diagnosis as documented in the resident's note. I was present for the entirety of the procedure personally performed key portions of the procedure. We used a 4mm punch biopsy of the right thenar eminence, there was very little bleeding after and no need for suture closure.

## 2015-10-15 ENCOUNTER — Ambulatory Visit (INDEPENDENT_AMBULATORY_CARE_PROVIDER_SITE_OTHER): Payer: Medicaid Other | Admitting: Internal Medicine

## 2015-10-15 ENCOUNTER — Ambulatory Visit: Payer: Medicaid Other | Admitting: Internal Medicine

## 2015-10-15 ENCOUNTER — Encounter: Payer: Self-pay | Admitting: Internal Medicine

## 2015-10-15 VITALS — BP 140/87 | HR 81 | Temp 97.8°F | Ht 69.0 in | Wt 169.9 lb

## 2015-10-15 DIAGNOSIS — M7989 Other specified soft tissue disorders: Secondary | ICD-10-CM

## 2015-10-15 MED ORDER — ACETAMINOPHEN 500 MG PO TABS: 1000.0000 mg | ORAL_TABLET | Freq: Three times a day (TID) | ORAL | Status: AC | PRN

## 2015-10-15 MED ORDER — IBUPROFEN 800 MG PO TABS: 800.0000 mg | ORAL_TABLET | Freq: Three times a day (TID) | ORAL | Status: AC | PRN

## 2015-10-15 NOTE — Assessment & Plan Note (Signed)
Lesions are continuing to heal, still marked edema of the R hand, much improved on the L which has continued to improve her functionality. She still endorses the pain as previously described but severity is continuing to improve. Pt finished pred and doxy course. Skin biopsy came back normal. Overall, clinical course improving significantly. -Pain and swelling mgmt with ibuprofen 800 TID and acetaminophen 1000mg  TID, elevation and exercises -RTC PRN or if symptoms do not improve in ~2weeks

## 2015-10-15 NOTE — Patient Instructions (Signed)
Ms. Erika Taylor,  Please continue to elevate your hand above your heart and use a stress ball to help decrease the swelling. Take ibuprofen 800mg  three times a day and tylenol (acetaminophen) 1000mg  three times a day. You can take them both together since they work differently. If your hand gets worse or isn't any better in 2 weeks, give us a call. Also, let us know and call our clinic if you'd like to establish regular care here.  We're glad you're starting to feel better.  Thanks, Reubin MilanBilly Prentis Langdon and Dr. Oswaldo DoneVincent

## 2015-10-15 NOTE — Progress Notes (Signed)
   Patient ID: Erika Taylor female   DOB: Apr 18, 1986 29 y.o.   MRN: [id number]  Subjective:   HPI: [name] is a 29 y.o. with PMH of depression who [facility] for follow-up of her bilateral hand swelling. She says the swelling is still present but continuing to improve. She has good use of her L hand but still more limited use of the R hand due to pain, swelling, and numbness which are improving.   Please see problem-based charting for status of medical issues pertinent to this visit.  Review of Systems: Pertinent items noted in HPI and remainder of comprehensive ROS otherwise negative.  Objective:  Physical Exam: Filed Vitals:   10/15/15 1344  BP: 140/87  Pulse: 81  Temp: 97.8 F (36.6 C)  TempSrc: Oral  Height: 5\' 9"  (1.753 m)  Weight: 169 lb 14.4 oz (77.066 kg)  SpO2: 99%   Gen: Well-appearing, alert and oriented to person, place, and time HEENT: Oropharynx clear without erythema or exudate.  Neck: No cervical LAD, no thyromegaly or nodules, no JVD noted. CV: Normal rate, regular rhythm, no murmurs, rubs, or gallops Pulmonary: Normal effort, CTA bilaterally, no wheezing, rales, or rhonchi Abdominal: Soft, non-tender, non-distended, without rebound, guarding, or masses Extremities: Distal pulses 2+ in upper and lower extremities bilaterally.. Bilateral hand swelling, most significant over right thenar eminence w/ overlying erythema. Ulcerated area in palmar creases bilaterally. Pulses intact distally. No bullous lesions present. Some areas of scattered, blanchable erythema overlying dorsum of the hand. No obvious pallor of the fingertips, warm to the touch.  Skin: No atypical appearing moles. No rashes  Assessment & Plan:  Please see problem-based charting for assessment and plan.  Reubin MilanBilly Kambrey Hagger, MD Resident Physician, PGY-1 [facility]

## 2015-10-16 NOTE — Progress Notes (Signed)
Internal Medicine Clinic Attending  I saw and evaluated the patient.  I personally confirmed the key portions of the history and exam documented by Dr. Kyung RuddKennedy and I reviewed pertinent patient test results.  The assessment, diagnosis, and plan were formulated together and I agree with the documentation in the resident's note.  Bilateral hand errythema and edema are much improved now. Punch biopsy from last week was normal with no active vasculitis. Erosions of epidermis are healing well. Continue to use NSAIDs as needed for pain, return to clinic if any new lesions.

## 2016-08-01 DIAGNOSIS — R87610 Atypical squamous cells of undetermined significance on cytologic smear of cervix (ASC-US): Secondary | ICD-10-CM | POA: Insufficient documentation

## 2016-08-01 DIAGNOSIS — R87810 Cervical high risk human papillomavirus (HPV) DNA test positive: Secondary | ICD-10-CM

## 2017-06-06 ENCOUNTER — Ambulatory Visit (HOSPITAL_COMMUNITY)
Admission: EM | Admit: 2017-06-06 | Discharge: 2017-06-06 | Disposition: A | Discharge status: Home / Self Care | Payer: Medicaid Other | Attending: Internal Medicine | Admitting: Internal Medicine

## 2017-06-06 ENCOUNTER — Encounter (HOSPITAL_COMMUNITY): Payer: Self-pay | Admitting: *Deleted

## 2017-06-06 DIAGNOSIS — K130 Diseases of lips: Secondary | ICD-10-CM | POA: Diagnosis not present

## 2017-06-06 MED ORDER — MUPIROCIN CALCIUM 2 % NA OINT: TOPICAL_OINTMENT | NASAL | 0 refills | Status: DC

## 2017-06-06 NOTE — ED Provider Notes (Addendum)
CSN: [id number]     Arrival date & time 06/06/17  1800 History   First MD Initiated Contact with Patient 06/06/17 1813     Chief Complaint  Patient presents with  . Mouth Lesions   (Consider location/radiation/quality/duration/timing/severity/associated sxs/prior Treatment) 31 year old female who states she has been under a lot of stress recently is complaining of a dry cracking areas on the corners of her lips. It hurts when she opens her mouth wide. Salt and acidic foods irritate. No other lesions to the lips or face.      Past Medical History:  Diagnosis Date  . Depression    History reviewed. No pertinent surgical history. History reviewed. No pertinent family history. Social History  Substance Use Topics  . Smoking status: Former Smoker    Start date: 09/14/2015  . Smokeless tobacco: Not on file  . Alcohol use No   OB History    No data available     Review of Systems  Constitutional: Negative.   HENT: Negative for congestion, drooling, ear pain, sinus pressure and sore throat.        As per history of present illness  Eyes: Negative.   Respiratory: Negative.   Neurological: Negative.   All other systems reviewed and are negative.   Allergies  Patient has no known allergies.  Home Medications   Prior to Admission medications   Medication Sig Start Date End Date Taking? Authorizing Provider  buPROPion (WELLBUTRIN) 100 MG tablet Take 200 mg by mouth 2 (two) times daily.    [provider]  clobetasol cream (TEMOVATE) 0.05 % Apply topically 2 (two) times daily. Apply to palms of hands only. 10/05/15   Jana Halfaylor, Nicholas A, MD  clonazePAM (KLONOPIN) 1 MG tablet Take 1 mg by mouth daily as needed for anxiety.    [provider]  doxycycline (VIBRA-TABS) 100 MG tablet Take 1 tablet (100 mg total) by mouth 2 (two) times daily. 10/06/15   Courtney ParisJones, Eden W, MD  mupirocin nasal ointment (BACTROBAN) 2 % Apply small amount of ointment to corners of mouth bid.  06/06/17   Hayden RasmussenMabe, Davine Sweney, NP  predniSONE (DELTASONE) 20 MG tablet Take 1 tablet (20 mg total) by mouth daily with breakfast. 10/06/15   Courtney ParisJones, Eden W, MD  triamcinolone cream (KENALOG) 0.1 % Apply topically 4 (four) times daily. Do not apply to face or within skin folds. 10/05/15   Jana Halfaylor, Nicholas A, MD   Meds Ordered and Administered this Visit  Medications - No data to display  BP 133/78 (BP Location: Right Arm)   Pulse 71   Temp 98.3 F (36.8 C) (Oral)   Resp 16   SpO2 98%  No data found.   Physical Exam  Constitutional: She is oriented to person, place, and time. She appears well-developed and well-nourished. No distress.  HENT:  Head: Normocephalic and atraumatic.  Mouth/Throat: Oropharynx is clear and moist.  The corners on both sides of the lips with some crusting and cracking of the soft tissue. This has been occurring for several days. No drainage from these lesions.  Musculoskeletal: She exhibits no edema.  Neurological: She is alert and oriented to person, place, and time.  Skin: Skin is warm and dry.  Psychiatric: She has a normal mood and affect.  Nursing note and vitals reviewed.   Urgent Care Course     Procedures (including critical care time)  Labs Review Labs Reviewed - No data to display  Imaging Review No results found.   Visual Acuity  Review  Right Eye Distance:   Left Eye Distance:   Bilateral Distance:    Right Eye Near:   Left Eye Near:    Bilateral Near:         MDM   1. Angular cheilitis    Apply the mupirocin ointment to the corners of the lip twice a day. This is specifically for potential staph germs that can cause infection at this location. Also use an antifungal agent such as Lotrimin AF twice a day. In the meantime he may use petrolatum or Vaseline to protect the source from the saliva in your mouth. This is generally not a communicable disease. Biotene can be used to help keep the mouth from being so dry. Meds ordered this  encounter  Medications  . mupirocin nasal ointment (BACTROBAN) 2 %    Sig: Apply small amount of ointment to corners of mouth bid.    Dispense:  1 g    Refill:  0    Order Specific Question:   Supervising Provider    Answer:   Eustace Moore [[id number]]       Hayden Rasmussen, NP 06/06/17 Santiago Bur, NP 06/06/17 1858

## 2017-06-06 NOTE — Discharge Instructions (Signed)
Apply the mupirocin ointment to the corners of the lip twice a day. This is specifically for potential staph germs that can cause infection at this location. Also use an antifungal agent such as Lotrimin AF twice a day. In the meantime he may use petrolatum or Vaseline to protect the source from the saliva in your mouth. This is generally not a communicable disease. Biotene can be used to help keep the mouth from being so dry.

## 2017-06-06 NOTE — ED Triage Notes (Signed)
Pt  Reports   Symptoms  Of  Cracking     Areas   Of  Irritation  On  The  Corners  Of  Her  Mouth  For  sev   Days  Denies   Any   New  Medications    Or  Known  Causative  Agents

## 2017-10-10 ENCOUNTER — Ambulatory Visit (HOSPITAL_COMMUNITY)
Admission: EM | Admit: 2017-10-10 | Discharge: 2017-10-10 | Disposition: A | Discharge status: Home / Self Care | Payer: Self-pay | Attending: Internal Medicine | Admitting: Internal Medicine

## 2017-10-10 ENCOUNTER — Encounter (HOSPITAL_COMMUNITY): Payer: Self-pay | Admitting: Emergency Medicine

## 2017-10-10 DIAGNOSIS — B9689 Other specified bacterial agents as the cause of diseases classified elsewhere: Secondary | ICD-10-CM | POA: Insufficient documentation

## 2017-10-10 DIAGNOSIS — N760 Acute vaginitis: Secondary | ICD-10-CM | POA: Insufficient documentation

## 2017-10-10 DIAGNOSIS — N3001 Acute cystitis with hematuria: Secondary | ICD-10-CM | POA: Insufficient documentation

## 2017-10-10 LAB — POCT URINALYSIS DIP (DEVICE)
BILIRUBIN URINE: NEGATIVE
GLUCOSE, UA: NEGATIVE mg/dL
KETONES UR: NEGATIVE mg/dL
Nitrite: NEGATIVE
PROTEIN: NEGATIVE mg/dL
Specific Gravity, Urine: 1.01 (ref 1.005–1.030)
Urobilinogen, UA: 0.2 mg/dL (ref 0.0–1.0)
pH: 6 (ref 5.0–8.0)

## 2017-10-10 MED ORDER — NITROFURANTOIN MONOHYD MACRO 100 MG PO CAPS: 100.0000 mg | ORAL_CAPSULE | Freq: Two times a day (BID) | ORAL | 0 refills | Status: AC

## 2017-10-10 MED ORDER — METRONIDAZOLE 500 MG PO TABS: 500.0000 mg | ORAL_TABLET | Freq: Two times a day (BID) | ORAL | 0 refills | Status: AC

## 2017-10-10 MED ORDER — FLUCONAZOLE 150 MG PO TABS: 150.0000 mg | ORAL_TABLET | Freq: Every day | ORAL | 0 refills | Status: DC

## 2017-10-10 NOTE — ED Notes (Signed)
Obtained a clean and dirty urine with instructions.

## 2017-10-10 NOTE — ED Triage Notes (Signed)
Patient has noticed an odor and frequent urination and slight discharge and feels very uncomfortable

## 2017-10-10 NOTE — ED Provider Notes (Signed)
[facility]    CSN: [id number] Arrival date & time: 10/10/17  1958     History   Chief Complaint Chief Complaint  Patient presents with  . Urinary Tract Infection    HPI Erika Taylor is a 31 y.o. female.   Presenting today complaining for possible UTI and possible bacterial Vaginitis both onset more than 1 week ago.   UTI: Patient reports urinary frequency and urgency but no dysuria, back pain, flank pain or nausea or fever. Denies hematuria or abdominal pain.   Vaginitis: Reports more than 1 week duration of malodorous vaginal discharge that is yellow in color. She denies vaginal itchiness, irritation or rash. Patient is sexually active with 1 partner. She denies recent changes in body hygiene product. She has hx of BV and states that it feels like BV to her.       Past Medical History:  Diagnosis Date  . Depression     Patient Active Problem List   Diagnosis Date Noted  . Bilateral hand swelling 10/06/2015  . Polysubstance abuse (HCC) 10/04/2015    History reviewed. No pertinent surgical history.  OB History    No data available       Home Medications    Prior to Admission medications   Medication Sig Start Date End Date Taking? Authorizing Provider  buPROPion (WELLBUTRIN) 100 MG tablet Take 200 mg by mouth 2 (two) times daily.   Yes [provider]  clobetasol cream (TEMOVATE) 0.05 % Apply topically 2 (two) times daily. Apply to palms of hands only. 10/05/15   Jana Halfaylor, Nicholas A, MD  clonazePAM (KLONOPIN) 1 MG tablet Take 1 mg by mouth daily as needed for anxiety.    [provider]  doxycycline (VIBRA-TABS) 100 MG tablet Take 1 tablet (100 mg total) by mouth 2 (two) times daily. 10/06/15   Courtney ParisJones, Eden W, MD  fluconazole (DIFLUCAN) 150 MG tablet Take 1 tablet (150 mg total) daily by mouth. 10/10/17   Lucia EstelleZheng, Adream Parzych, NP  metroNIDAZOLE (FLAGYL) 500 MG tablet Take 1 tablet (500 mg total) 2 (two) times daily for 7 days by mouth.  10/10/17 10/17/17  Lucia EstelleZheng, Helayna Dun, NP  mupirocin nasal ointment (BACTROBAN) 2 % Apply small amount of ointment to corners of mouth bid. 06/06/17   Hayden RasmussenMabe, David, NP  nitrofurantoin, macrocrystal-monohydrate, (MACROBID) 100 MG capsule Take 1 capsule (100 mg total) 2 (two) times daily for 5 days by mouth. 10/10/17 10/15/17  Lucia EstelleZheng, Adaisha Campise, NP  predniSONE (DELTASONE) 20 MG tablet Take 1 tablet (20 mg total) by mouth daily with breakfast. 10/06/15   Courtney ParisJones, Eden W, MD  triamcinolone cream (KENALOG) 0.1 % Apply topically 4 (four) times daily. Do not apply to face or within skin folds. 10/05/15   Jana Halfaylor, Nicholas A, MD    Family History No family history on file.  Social History Social History   Tobacco Use  . Smoking status: Former Smoker    Start date: 09/14/2015  Substance Use Topics  . Alcohol use: No    Alcohol/week: 0.0 oz  . Drug use: No     Allergies   Patient has no known allergies.   Review of Systems Review of Systems  Constitutional: Negative for fever.       See HPI  HENT: Negative.   Respiratory: Negative for cough and shortness of breath.   Cardiovascular: Negative for chest pain and palpitations.  Gastrointestinal: Negative for abdominal pain, nausea and vomiting.       +bloating  Endocrine: Negative for polyuria.  Genitourinary: Positive for urgency and vaginal discharge. Negative for dysuria, flank pain, genital sores, vaginal bleeding and vaginal pain.       +urinary frequency   Skin: Negative for rash.     Physical Exam Triage Vital Signs ED Triage Vitals [10/10/17 2035]  Enc Vitals Group     BP (!) 135/92     Pulse Rate 79     Resp 20     Temp (!) 97.3 F (36.3 C)     Temp Source Oral     SpO2 100 %     Weight      Height      Head Circumference      Peak Flow      Pain Score      Pain Loc      Pain Edu?      Excl. in GC?    No data found.  Updated Vital Signs BP (!) 135/92 (BP Location: Left Arm)   Pulse 79   Temp (!) 97.3 F (36.3 C) (Oral)    Resp 20   SpO2 100%   Visual Acuity Right Eye Distance:   Left Eye Distance:   Bilateral Distance:    Right Eye Near:   Left Eye Near:    Bilateral Near:     Physical Exam  Constitutional: She is oriented to person, place, and time. She appears well-developed and well-nourished. No distress.  Cardiovascular: Normal rate, regular rhythm and normal heart sounds.  Pulmonary/Chest: Effort normal and breath sounds normal. She has no wheezes.  Abdominal: Soft. Bowel sounds are normal. There is no tenderness.  Genitourinary:  Genitourinary Comments: Labia majora and minora symmetrical with no lesions. Vaginal canal is pink and moist. Small amount of white thin discharge noted in the canal. Unable to visualize the cervix due to its position. -uterine tenderness.   Neurological: She is alert and oriented to person, place, and time.  Skin: Skin is warm and dry. She is not diaphoretic.  Nursing note and vitals reviewed.    UC Treatments / Results  Labs (all labs ordered are listed, but only abnormal results are displayed) Labs Reviewed  POCT URINALYSIS DIP (DEVICE) - Abnormal; Notable for the following components:      Result Value   Hgb urine dipstick TRACE (*)    Leukocytes, UA SMALL (*)    All other components within normal limits  URINE CULTURE  CERVICOVAGINAL ANCILLARY ONLY    EKG  EKG Interpretation None       Radiology No results found.  Procedures Procedures (including critical care time)  Medications Ordered in UC Medications - No data to display   Initial Impression / Assessment and Plan / UC Course  I have reviewed the triage vital signs and the nursing notes.  Pertinent labs & imaging results that were available during my care of the patient were reviewed by me and considered in my medical decision making (see chart for details).  Final Clinical Impressions(s) / UC Diagnoses   Final diagnoses:  BV (bacterial vaginosis)  Acute cystitis with hematuria    UTI: RX for macrobid given. Culture pending  BV: Cytology pending for Bvag, Cvag, Trich and GC/CT. RX for flagyl given. Diflucan given to prevent yeast infection.     ED Discharge Orders        Ordered    nitrofurantoin, macrocrystal-monohydrate, (MACROBID) 100 MG capsule  2 times daily     10/10/17 2126    metroNIDAZOLE (FLAGYL) 500 MG tablet  2  times daily     10/10/17 2126    fluconazole (DIFLUCAN) 150 MG tablet  Daily     10/10/17 2126     Controlled Substance Prescriptions [place] Controlled Substance Registry consulted? Not Applicable   Lucia Estelle, NP 10/10/17 2128

## 2017-10-11 LAB — CERVICOVAGINAL ANCILLARY ONLY
Bacterial vaginitis: POSITIVE — AB
CANDIDA VAGINITIS: NEGATIVE
CHLAMYDIA, DNA PROBE: NEGATIVE
NEISSERIA GONORRHEA: NEGATIVE
TRICH (WINDOWPATH): NEGATIVE

## 2017-10-12 LAB — URINE CULTURE

## 2017-12-08 ENCOUNTER — Emergency Department (HOSPITAL_COMMUNITY)
Admission: EM | Admit: 2017-12-08 | Discharge: 2017-12-09 | Disposition: A | Discharge status: Home / Self Care | Payer: Self-pay | Attending: Emergency Medicine | Admitting: Emergency Medicine

## 2017-12-08 ENCOUNTER — Encounter (HOSPITAL_COMMUNITY): Payer: Self-pay | Admitting: Emergency Medicine

## 2017-12-08 DIAGNOSIS — G43109 Migraine with aura, not intractable, without status migrainosus: Secondary | ICD-10-CM

## 2017-12-08 DIAGNOSIS — Z87891 Personal history of nicotine dependence: Secondary | ICD-10-CM | POA: Insufficient documentation

## 2017-12-08 DIAGNOSIS — G43909 Migraine, unspecified, not intractable, without status migrainosus: Secondary | ICD-10-CM | POA: Insufficient documentation

## 2017-12-08 DIAGNOSIS — Z79899 Other long term (current) drug therapy: Secondary | ICD-10-CM | POA: Insufficient documentation

## 2017-12-08 NOTE — ED Notes (Signed)
[facility] called for Code Stroke per Dr. Blinda LeatherwoodPollina

## 2017-12-09 ENCOUNTER — Emergency Department (HOSPITAL_COMMUNITY): Payer: Self-pay

## 2017-12-09 DIAGNOSIS — G43109 Migraine with aura, not intractable, without status migrainosus: Secondary | ICD-10-CM

## 2017-12-09 DIAGNOSIS — G44059 Short lasting unilateral neuralgiform headache with conjunctival injection and tearing (SUNCT), not intractable: Secondary | ICD-10-CM

## 2017-12-09 LAB — CBC
HEMATOCRIT: 38.9 % (ref 36.0–46.0)
Hemoglobin: 12.6 g/dL (ref 12.0–15.0)
MCH: 31.5 pg (ref 26.0–34.0)
MCHC: 32.4 g/dL (ref 30.0–36.0)
MCV: 97.3 fL (ref 78.0–100.0)
Platelets: 418 10*3/uL — ABNORMAL HIGH (ref 150–400)
RBC: 4 MIL/uL (ref 3.87–5.11)
RDW: 12.7 % (ref 11.5–15.5)
WBC: 7.6 10*3/uL (ref 4.0–10.5)

## 2017-12-09 LAB — COMPREHENSIVE METABOLIC PANEL
ALT: 12 U/L — AB (ref 14–54)
AST: 16 U/L (ref 15–41)
Albumin: 4.3 g/dL (ref 3.5–5.0)
Alkaline Phosphatase: 38 U/L (ref 38–126)
Anion gap: 11 (ref 5–15)
BUN: <5 mg/dL — ABNORMAL LOW (ref 6–20)
CALCIUM: 9.1 mg/dL (ref 8.9–10.3)
CHLORIDE: 105 mmol/L (ref 101–111)
CO2: 23 mmol/L (ref 22–32)
CREATININE: 0.81 mg/dL (ref 0.44–1.00)
GFR calc non Af Amer: >60 mL/min (ref >60)
Glucose, Bld: 103 mg/dL — ABNORMAL HIGH (ref 65–99)
Potassium: 3.5 mmol/L (ref 3.5–5.1)
Sodium: 139 mmol/L (ref 135–145)
Total Bilirubin: 0.6 mg/dL (ref 0.3–1.2)
Total Protein: 7.5 g/dL (ref 6.5–8.1)

## 2017-12-09 LAB — I-STAT TROPONIN, ED: TROPONIN I, POC: 0 ng/mL (ref 0.00–0.08)

## 2017-12-09 LAB — DIFFERENTIAL
BASOS PCT: 0 %
Basophils Absolute: 0 10*3/uL (ref 0.0–0.1)
Eosinophils Absolute: 0.1 10*3/uL (ref 0.0–0.7)
Eosinophils Relative: 1 %
Lymphocytes Relative: 30 %
Lymphs Abs: 2.3 10*3/uL (ref 0.7–4.0)
MONO ABS: 0.4 10*3/uL (ref 0.1–1.0)
MONOS PCT: 6 %
NEUTROS ABS: 4.7 10*3/uL (ref 1.7–7.7)
Neutrophils Relative %: 63 %

## 2017-12-09 LAB — I-STAT BETA HCG BLOOD, ED (MC, WL, AP ONLY)

## 2017-12-09 LAB — I-STAT CHEM 8, ED
BUN: 5 mg/dL — ABNORMAL LOW (ref 6–20)
CALCIUM ION: 1.1 mmol/L — AB (ref 1.15–1.40)
Chloride: 107 mmol/L (ref 101–111)
Creatinine, Ser: 1 mg/dL (ref 0.44–1.00)
GLUCOSE: 101 mg/dL — AB (ref 65–99)
HCT: 38 % (ref 36.0–46.0)
HEMOGLOBIN: 12.9 g/dL (ref 12.0–15.0)
POTASSIUM: 3.7 mmol/L (ref 3.5–5.1)
Sodium: 143 mmol/L (ref 135–145)
TCO2: 24 mmol/L (ref 22–32)

## 2017-12-09 LAB — APTT: aPTT: 28 seconds (ref 24–36)

## 2017-12-09 LAB — PROTIME-INR
INR: 1.04
Prothrombin Time: 13.5 seconds (ref 11.4–15.2)

## 2017-12-09 MED ORDER — CEPHALEXIN 500 MG PO CAPS: 500.0000 mg | ORAL_CAPSULE | Freq: Two times a day (BID) | ORAL | 0 refills | Status: DC

## 2017-12-09 NOTE — ED Notes (Signed)
2230 pt had sudden onset of R sided facial numbness, sharp/shooting pain down R side of face. Pt also reports R sided weakness, grip deficits noted. Weakness to both legs. Pt does report drinking ETOH at dinner earlier in the evening. Per Dr. Blinda LeatherwoodPollina call Code Stroke.

## 2017-12-09 NOTE — ED Notes (Signed)
Code Stroke cancelled per Dr. Otelia LimesLindzen @ 039

## 2017-12-09 NOTE — ED Provider Notes (Addendum)
[facility] EMERGENCY DEPARTMENT Provider Note   CSN: [id number] Arrival date & time: 12/08/17  2326     History   Chief Complaint Chief Complaint  Patient presents with  . Weakness  . Numbness    HPI Erika Taylor is a 32 y.o. female.  Patient presents to the ER for evaluation of sudden onset of severe headache with tearing of her right eye, shooting pain on the right side of her face with numbness and weakness of the right arm.  This occurred 1-1/2 hours ago.  Patient does have a history of migraines, but has never had similar headaches or pain.  At time of arrival to the ER, symptoms have almost completely resolved.      Past Medical History:  Diagnosis Date  . Depression     Patient Active Problem List   Diagnosis Date Noted  . Bilateral hand swelling 10/06/2015  . Polysubstance abuse (HCC) 10/04/2015    History reviewed. No pertinent surgical history.  OB History    No data available       Home Medications    Prior to Admission medications   Medication Sig Start Date End Date Taking? Authorizing Provider  buPROPion (WELLBUTRIN) 100 MG tablet Take 200 mg by mouth 2 (two) times daily.    [provider]  clobetasol cream (TEMOVATE) 0.05 % Apply topically 2 (two) times daily. Apply to palms of hands only. 10/05/15   Jana Half, MD  clonazePAM (KLONOPIN) 1 MG tablet Take 1 mg by mouth daily as needed for anxiety.    [provider]  doxycycline (VIBRA-TABS) 100 MG tablet Take 1 tablet (100 mg total) by mouth 2 (two) times daily. 10/06/15   Courtney Paris, MD  fluconazole (DIFLUCAN) 150 MG tablet Take 1 tablet (150 mg total) daily by mouth. 10/10/17   Lucia Estelle, NP  mupirocin nasal ointment (BACTROBAN) 2 % Apply small amount of ointment to corners of mouth bid. 06/06/17   Hayden Rasmussen, NP  predniSONE (DELTASONE) 20 MG tablet Take 1 tablet (20 mg total) by mouth daily with breakfast. 10/06/15   Courtney Paris, MD    triamcinolone cream (KENALOG) 0.1 % Apply topically 4 (four) times daily. Do not apply to face or within skin folds. 10/05/15   Jana Half, MD    Family History No family history on file.  Social History Social History   Tobacco Use  . Smoking status: Former Smoker    Start date: 09/14/2015  . Smokeless tobacco: Never Used  Substance Use Topics  . Alcohol use: No    Alcohol/week: 0.0 oz  . Drug use: No     Allergies   Patient has no known allergies.   Review of Systems Review of Systems  Neurological: Positive for weakness, numbness and headaches.  All other systems reviewed and are negative.    Physical Exam Updated Vital Signs BP (!) 152/83 (BP Location: Right Arm)   Pulse 84   Temp 98.1 F (36.7 C) (Oral)   Resp 18   Ht 5\' 9"  (1.753 m)   Wt 68 kg (150 lb)   SpO2 (!) 8%   BMI 22.15 kg/m   Physical Exam  Constitutional: She is oriented to person, place, and time. She appears well-developed and well-nourished. No distress.  HENT:  Head: Normocephalic and atraumatic.  Right Ear: Hearing normal.  Left Ear: Hearing normal.  Nose: Nose normal.  Mouth/Throat: Oropharynx is clear and moist and mucous membranes are normal.  Eyes: Conjunctivae and EOM are normal. Pupils are equal, round, and reactive to light.  Neck: Normal range of motion. Neck supple.  Cardiovascular: Regular rhythm, S1 normal and S2 normal. Exam reveals no gallop and no friction rub.  No murmur heard. Pulmonary/Chest: Effort normal and breath sounds normal. No respiratory distress. She exhibits no tenderness.  Abdominal: Soft. Normal appearance and bowel sounds are normal. There is no hepatosplenomegaly. There is no tenderness. There is no rebound, no guarding, no tenderness at McBurney's point and negative Murphy's sign. No hernia.  Musculoskeletal: Normal range of motion.  Neurological: She is alert and oriented to person, place, and time. She has normal strength. No cranial nerve  deficit or sensory deficit. Coordination normal. GCS eye subscore is 4. GCS verbal subscore is 5. GCS motor subscore is 6.  Skin: Skin is warm, dry and intact. No rash noted. No cyanosis.  Erythema right cheek  Psychiatric: She has a normal mood and affect. Her speech is normal and behavior is normal. Thought content normal.  Nursing note and vitals reviewed.    ED Treatments / Results  Labs (all labs ordered are listed, but only abnormal results are displayed) Labs Reviewed  CBC - Abnormal; Notable for the following components:      Result Value   Platelets 418 (*)    All other components within normal limits  COMPREHENSIVE METABOLIC PANEL - Abnormal; Notable for the following components:   Glucose, Bld 103 (*)    BUN <5 (*)    ALT 12 (*)    All other components within normal limits  I-STAT CHEM 8, ED - Abnormal; Notable for the following components:   BUN 5 (*)    Glucose, Bld 101 (*)    Calcium, Ion 1.10 (*)    All other components within normal limits  PROTIME-INR  APTT  DIFFERENTIAL  I-STAT TROPONIN, ED  I-STAT BETA HCG BLOOD, ED ([facility], [facility], [facility] ONLY)    EKG  EKG Interpretation None       Radiology Ct Head Code Stroke Wo Contrast  Result Date: 12/09/2017 CLINICAL DATA:  Code stroke. May still evaluation for right-sided facial numbness with right-sided numbness and weakness. EXAM: CT HEAD WITHOUT CONTRAST TECHNIQUE: Contiguous axial images were obtained from the base of the skull through the vertex without intravenous contrast. COMPARISON:  Prior CT from 12/13/2013. FINDINGS: Brain: Cerebral volume within normal limits for patient age. No evidence for acute intracranial hemorrhage. No findings to suggest acute large vessel territory infarct. No mass lesion, midline shift, or mass effect. Ventricles are normal in size without evidence for hydrocephalus. No extra-axial fluid collection identified. Vascular: No hyperdense vessel identified. Skull: Scalp soft tissues demonstrate  no acute abnormality.Calvarium intact. Sinuses/Orbits: Globes and orbital soft tissues are within normal limits. Visualized paranasal sinuses are clear. No mastoid effusion. ASPECTS [facility] Stroke Program Early CT Score) - Ganglionic level infarction (caudate, lentiform nuclei, internal capsule, insula, M1-M3 cortex): 7 - Supraganglionic infarction (M4-M6 cortex): 3 Total score (0-10 with 10 being normal): 10 IMPRESSION: 1. Negative head CT.  No acute intracranial abnormality identified. 2. ASPECTS is 10. These results were communicated to Dr. Otelia Limes At 12:21 amon 1/5/2019by text page via the [facility] messaging system. Electronically Signed   By: Rise Mu M.D.   On: 12/09/2017 00:21    Procedures Procedures (including critical care time)  Medications Ordered in ED Medications - No data to display   Initial Impression / Assessment and Plan / ED Course  I have reviewed the  triage vital signs and the nursing notes.  Pertinent labs & imaging results that were available during my care of the patient were reviewed by me and considered in my medical decision making (see chart for details).     Patient presents to the emergency department for evaluation of sudden onset headache with associated eye pain, tearing, numbness and weakness of right face and right arm.  She does have a history of recurrent migraines and has had complicated migraines in the past, but reports that this feels different.  Her symptoms are almost completely resolved without intervention.  She was initiated as a code stroke initially, has been evaluated by neurology and it is felt that this is consistent with migraine.  I do agree that there is no sign of any acute intracranial abnormality that requires further workup.  She does have some mild erythema on the right side of her face.  It is unclear if she was rubbing the face because it was numb, but is unaware of any trauma.  This does not appear to be infectious.  There is  no tenderness overlying.  Patient reassured this is likely a complicated migraine or ocular migraine, no further treatment necessary as her pain has resolved.  She will follow-up with neurology as an outpatient.  Addendum: Patient seen recently at urgent care diagnosed with bacterial vaginosis and urinary tract infection.  She was treated with Flagyl and Macrobid.  Patient still experiencing urinary symptoms.  Will prescribe Keflex.  Final Clinical Impressions(s) / ED Diagnoses   Final diagnoses:  Complicated migraine    ED Discharge Orders    None       Pollina, Canary Brimhristopher J, MD 12/09/17 0050    Gilda CreasePollina, Christopher J, MD 12/09/17 [phone]

## 2017-12-09 NOTE — Consult Note (Signed)
NEURO HOSPITALIST CONSULT NOTE   Requestig physician: Dr. Blinda Leatherwood  Reason for Consult: Severe right sided headache and facial pain with right eye tearing, right facial flushing, right hemifield visual obscuration and numbness/weakness of RUE.  History obtained from:  Patient and Chart     HPI:                                                                                                                                          Erika Taylor is an 32 y.o. female who had sudden onset while driving of severe right sided headache and facial pain with right eye tearing, right hemifield visual obscuration and numbness/weakness of RUE. She states that this has never happened to her before and that she thought she was having a stroke. She has a history of throbbing headaches with photophobia that began in her 20's which she treats PRN with NSAIDs. She has not been diagnosed with a headache subtype however. At the time of her arrival to the ED her symptoms had almost completely resolved. After CT of head symptoms had completely resolved. There was some right facial flushing that the patient was not aware of which was noted by the examiner following head CT.   Past Medical History:  Diagnosis Date  . Depression     History reviewed. No pertinent surgical history.  No family history on file.  Social History:  reports that she has quit smoking. She started smoking about 2 years ago. she has never used smokeless tobacco. She reports that she does not drink alcohol or use drugs.  No Known Allergies  HOME MEDICATIONS:                                                                                                                       ROS:  As per HPI.   Blood pressure (!) 152/83, pulse 84, temperature 98.1 F (36.7 C), temperature source Oral,  resp. rate 18, height 5\' 9"  (1.753 m), weight 68 kg (150 lb), SpO2 (!) 8 %.  Examination:                                                                                                      HEENT-  [place]/AT. Flushing along the posterior aspect of her right cheek which blanches with pressure. Lungs- Respirations unlabored.  Extremities- No edema  Neurological Examination Mental Status: Alert, oriented, thought content appropriate.  Speech fluent without evidence of aphasia.  Able to follow all commands without difficulty. Cranial Nerves: II: Visual fields intact. PERRL.   III,IV, VI: EOMI without nystagmus. Saccadic quality of visual pursuits is noted. No ptosis V,VII: smile symmetric, facial temp sensation normal bilaterally VIII: hearing intact to voice IX,X: uvula rises symmetrically XI: Symmetric shoulder shrug XII: midline tongue extension Motor: Right : Upper extremity   5/5    Left:     Upper extremity   5/5  Lower extremity   5/5     Lower extremity   5/5 Normal tone throughout; no atrophy noted Sensory: Temp and light touch intact x 4. No extinction. Deep Tendon Reflexes: 2+ and symmetric throughout Plantars: Right: downgoing  Left: downgoing Cerebellar: No ataxia with FNF bilaterally Gait: Deferred  Lab Results: Basic Metabolic Panel: Recent Labs  Lab 12/08/17 2350 12/09/17 0004  NA 139 143  K 3.5 3.7  CL 105 107  CO2 23  --   GLUCOSE 103* 101*  BUN <5* 5*  CREATININE 0.81 1.00  CALCIUM 9.1  --     Liver Function Tests: Recent Labs  Lab 12/08/17 2350  AST 16  ALT 12*  ALKPHOS 38  BILITOT 0.6  PROT 7.5  ALBUMIN 4.3   No results for input(s): LIPASE, AMYLASE in the last 168 hours. No results for input(s): AMMONIA in the last 168 hours.  CBC: Recent Labs  Lab 12/08/17 2350 12/09/17 0004  WBC 7.6  --   NEUTROABS 4.7  --   HGB 12.6 12.9  HCT 38.9 38.0  MCV 97.3  --   PLT 418*  --     Cardiac Enzymes: No results for input(s): CKTOTAL, CKMB,  CKMBINDEX, TROPONINI in the last 168 hours.  Lipid Panel: No results for input(s): CHOL, TRIG, HDL, CHOLHDL, VLDL, LDLCALC in the last 168 hours.  CBG: No results for input(s): GLUCAP in the last 168 hours.  Microbiology: Results for orders placed or performed during the hospital encounter of 10/10/17  Urine culture     Status: Abnormal   Collection Time: 10/10/17  9:19 PM  Result Value Ref Range Status   Specimen Description URINE, CLEAN CATCH  Final   Special Requests NONE  Final   Culture MULTIPLE SPECIES PRESENT, SUGGEST RECOLLECTION (A)  Final   Report Status 10/12/2017 FINAL  Final    Coagulation Studies: Recent Labs    12/08/17 2350  LABPROT 13.5  INR 1.04    Imaging: Ct Head Code  Stroke Wo Contrast  Result Date: 12/09/2017 CLINICAL DATA:  Code stroke. May still evaluation for right-sided facial numbness with right-sided numbness and weakness. EXAM: CT HEAD WITHOUT CONTRAST TECHNIQUE: Contiguous axial images were obtained from the base of the skull through the vertex without intravenous contrast. COMPARISON:  Prior CT from 12/13/2013. FINDINGS: Brain: Cerebral volume within normal limits for patient age. No evidence for acute intracranial hemorrhage. No findings to suggest acute large vessel territory infarct. No mass lesion, midline shift, or mass effect. Ventricles are normal in size without evidence for hydrocephalus. No extra-axial fluid collection identified. Vascular: No hyperdense vessel identified. Skull: Scalp soft tissues demonstrate no acute abnormality.Calvarium intact. Sinuses/Orbits: Globes and orbital soft tissues are within normal limits. Visualized paranasal sinuses are clear. No mastoid effusion. ASPECTS [facility] Stroke Program Early CT Score) - Ganglionic level infarction (caudate, lentiform nuclei, internal capsule, insula, M1-M3 cortex): 7 - Supraganglionic infarction (M4-M6 cortex): 3 Total score (0-10 with 10 being normal): 10 IMPRESSION: 1. Negative head  CT.  No acute intracranial abnormality identified. 2. ASPECTS is 10. These results were communicated to Dr. Otelia LimesLindzen At 12:21 amon 1/5/2019by text page via the [facility] messaging system. Electronically Signed   By: Rise MuBenjamin  McClintock M.D.   On: 12/09/2017 00:21    Assessment: 1. 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 This condition is a rare type of headache disorder belonging to a broader headache category known as the trigeminal autonomic cephalalgias. 2. History of recurring headaches. She has never been diagnosed with a headache type, but her previous headaches have been accompanied by sensation of dizziness, have been throbbing, are associated with photophobia and improve by resting in a dark room. These headaches most likely are best categorized as migraines.   3. CT head is negative. 4. Neurological examination is normal. Of note, she has flushing along the posterior aspect of her right cheek which blanches with pressure. This most likely represents residual vasodilatation from autonomic effects of the episode of SUNCT she presented with this evening.   Recommendations: 1. Patient has been educated regarding the probable etiology of her headache episode.  2. She should return to the ED if symptoms recur with neurological deficit.  3. She has been advised to obtain and appointment with either [facility] or [facility] for outpatient follow up.   Electronically signed: Dr. Caryl PinaEric Raylen Ken 12/09/2017, 1:13 AM

## 2017-12-09 NOTE — ED Notes (Signed)
PT states understanding of care given, follow up care, and medication prescribed. PT ambulated from ED to car with a steady gait. 

## 2018-02-21 ENCOUNTER — Ambulatory Visit (INDEPENDENT_AMBULATORY_CARE_PROVIDER_SITE_OTHER): Payer: Self-pay | Admitting: Family Medicine

## 2018-02-21 ENCOUNTER — Encounter: Payer: Self-pay | Admitting: Family Medicine

## 2018-02-21 VITALS — BP 122/83 | HR 89 | Ht 69.0 in | Wt 149.0 lb

## 2018-02-21 DIAGNOSIS — F4329 Adjustment disorder with other symptoms: Secondary | ICD-10-CM

## 2018-02-21 DIAGNOSIS — R87810 Cervical high risk human papillomavirus (HPV) DNA test positive: Secondary | ICD-10-CM

## 2018-02-21 DIAGNOSIS — R3915 Urgency of urination: Secondary | ICD-10-CM

## 2018-02-21 DIAGNOSIS — Z818 Family history of other mental and behavioral disorders: Secondary | ICD-10-CM

## 2018-02-21 DIAGNOSIS — F4320 Adjustment disorder, unspecified: Secondary | ICD-10-CM | POA: Insufficient documentation

## 2018-02-21 DIAGNOSIS — N912 Amenorrhea, unspecified: Secondary | ICD-10-CM

## 2018-02-21 DIAGNOSIS — R87610 Atypical squamous cells of undetermined significance on cytologic smear of cervix (ASC-US): Secondary | ICD-10-CM

## 2018-02-21 DIAGNOSIS — R8290 Unspecified abnormal findings in urine: Secondary | ICD-10-CM

## 2018-02-21 LAB — POCT URINALYSIS DIPSTICK
Bilirubin, UA: NEGATIVE
Glucose, UA: NEGATIVE
NITRITE UA: POSITIVE
SPEC GRAV UA: 1.025 (ref 1.010–1.025)
Urobilinogen, UA: 0.2 E.U./dL
pH, UA: 5.5 (ref 5.0–8.0)

## 2018-02-21 LAB — POCT URINE PREGNANCY: PREG TEST UR: NEGATIVE

## 2018-02-21 MED ORDER — BUPROPION HCL ER (XL) 150 MG PO TB24: 150.0000 mg | ORAL_TABLET | ORAL | 0 refills | Status: DC

## 2018-02-21 MED ORDER — NITROFURANTOIN MONOHYD MACRO 100 MG PO CAPS: 100.0000 mg | ORAL_CAPSULE | Freq: Two times a day (BID) | ORAL | 0 refills | Status: DC

## 2018-02-21 NOTE — Patient Instructions (Addendum)
Please contact your prior family doctor's office about the OB/GYN referral.  Someone I recommend in OB/GYN there is Erika Taylor-[facility] is [facility]   Please realize, EXERCISE IS MEDICINE!  -  American Heart Association [facility]( AHA) guidelines for exercise : If you are in good health, without any medical conditions, you should engage in 150 minutes of moderate intensity aerobic activity per week.  This means you should be huffing and puffing throughout your workout.   Engaging in regular exercise will improve brain function and memory, as well as improve mood, boost immune system and help with weight management.  As well as the other, more well-known effects of exercise such as decreasing blood sugar levels, decreasing blood pressure,  and decreasing bad cholesterol levels/ increasing good cholesterol levels.     -  The AHA strongly endorses consumption of a diet that contains a variety of foods from all the food categories with an emphasis on fruits and vegetables; fat-free and low-fat dairy products; cereal and grain products; legumes and nuts; and fish, poultry, and/or extra lean meats.    Excessive food intake, especially of foods high in saturated and trans fats, sugar, and salt, should be avoided.    Adequate water intake of roughly 1/2 of your weight in pounds, should equal the ounces of water per day you should drink.  So for instance, if you're 200 pounds, that would be 100 ounces of water per day.         Mediterranean Diet  Why follow it? Research shows. . Those who follow the Mediterranean diet have a reduced risk of heart disease  . The diet is associated with a reduced incidence of Parkinson's and Alzheimer's diseases . People following the diet may have longer life expectancies and lower rates of chronic diseases  . The Dietary Guidelines for Americans recommends the Mediterranean diet as an eating plan to promote health and prevent disease  What Is the Mediterranean  Diet?  . Healthy eating plan based on typical foods and recipes of Mediterranean-style cooking . The diet is primarily a plant based diet; these foods should make up a majority of meals   Starches - Plant based foods should make up a majority of meals - They are an important sources of vitamins, minerals, energy, antioxidants, and fiber - Choose whole grains, foods high in fiber and minimally processed items  - Typical grain sources include wheat, oats, barley, corn, brown rice, bulgar, farro, millet, polenta, couscous  - Various types of beans include chickpeas, lentils, fava beans, black beans, white beans   Fruits  Veggies - Large quantities of antioxidant rich fruits & veggies; 6 or more servings  - Vegetables can be eaten raw or lightly drizzled with oil and cooked  - Vegetables common to the traditional Mediterranean Diet include: artichokes, arugula, beets, broccoli, brussel sprouts, cabbage, carrots, celery, collard greens, cucumbers, eggplant, kale, leeks, lemons, lettuce, mushrooms, okra, onions, peas, peppers, potatoes, pumpkin, radishes, rutabaga, shallots, spinach, sweet potatoes, turnips, zucchini - Fruits common to the Mediterranean Diet include: apples, apricots, avocados, cherries, clementines, dates, figs, grapefruits, grapes, melons, nectarines, oranges, peaches, pears, pomegranates, strawberries, tangerines  Fats - Replace butter and margarine with healthy oils, such as olive oil, canola oil, and tahini  - Limit nuts to no more than a handful a day  - Nuts include walnuts, almonds, pecans, pistachios, pine nuts  - Limit or avoid candied, honey roasted or heavily salted nuts - Olives are central to the Mediterranean diet -  can be eaten whole or used in a variety of dishes   Meats Protein - Limiting red meat: no more than a few times a month - When eating red meat: choose lean cuts and keep the portion to the size of deck of cards - Eggs: approx. 0 to 4 times a week  - Fish and  lean poultry: at least 2 a week  - Healthy protein sources include, chicken, [place], lean beef, lamb - Increase intake of seafood such as tuna, salmon, trout, mackerel, shrimp, scallops - Avoid or limit high fat processed meats such as sausage and bacon  Dairy - Include moderate amounts of low fat dairy products  - Focus on healthy dairy such as fat free yogurt, skim milk, low or reduced fat cheese - Limit dairy products higher in fat such as whole or 2% milk, cheese, ice cream  Alcohol - Moderate amounts of red wine is ok  - No more than 5 oz daily for women (all ages) and men older than age 75  - No more than 10 oz of wine daily for men younger than 85  Other - Limit sweets and other desserts  - Use herbs and spices instead of salt to flavor foods  - Herbs and spices common to the traditional Mediterranean Diet include: basil, bay leaves, chives, cloves, cumin, fennel, garlic, lavender, marjoram, mint, oregano, parsley, pepper, rosemary, sage, savory, sumac, tarragon, thyme   It's not just a diet, it's a lifestyle:  . The Mediterranean diet includes lifestyle factors typical of those in the region  . Foods, drinks and meals are best eaten with others and savored . Daily physical activity is important for overall good health . This could be strenuous exercise like running and aerobics . This could also be more leisurely activities such as walking, housework, yard-work, or taking the stairs . Moderation is the key; a balanced and healthy diet accommodates most foods and drinks . Consider portion sizes and frequency of consumption of certain foods   Meal Ideas & Options:  . Breakfast:  o Whole wheat toast or whole wheat English muffins with peanut butter & hard boiled egg o Steel cut oats topped with apples & cinnamon and skim milk  o Fresh fruit: banana, strawberries, melon, berries, peaches  o Smoothies: strawberries, bananas, greek yogurt, peanut butter o Low fat greek yogurt with  blueberries and granola  o Egg white omelet with spinach and mushrooms o Breakfast couscous: whole wheat couscous, apricots, skim milk, cranberries  . Sandwiches:  o Hummus and grilled vegetables (peppers, zucchini, squash) on whole wheat bread   o Grilled chicken on whole wheat pita with lettuce, tomatoes, cucumbers or tzatziki  o Tuna salad on whole wheat bread: tuna salad made with greek yogurt, olives, red peppers, capers, green onions o Garlic rosemary lamb pita: lamb sauted with garlic, rosemary, salt & pepper; add lettuce, cucumber, greek yogurt to pita - flavor with lemon juice and black pepper  . Seafood:  o Mediterranean grilled salmon, seasoned with garlic, basil, parsley, lemon juice and black pepper o Shrimp, lemon, and spinach whole-grain pasta salad made with low fat greek yogurt  o Seared scallops with lemon orzo  o Seared tuna steaks seasoned salt, pepper, coriander topped with tomato mixture of olives, tomatoes, olive oil, minced garlic, parsley, green onions and cappers  . Meats:  o Herbed greek chicken salad with kalamata olives, cucumber, feta  o Red bell peppers stuffed with spinach, bulgur, lean ground beef (or lentils) &  topped with feta   o Kebabs: skewers of chicken, tomatoes, onions, zucchini, squash  o [place] burgers: made with red onions, mint, dill, lemon juice, feta cheese topped with roasted red peppers . Vegetarian o Cucumber salad: cucumbers, artichoke hearts, celery, red onion, feta cheese, tossed in olive oil & lemon juice  o Hummus and whole grain pita points with a greek salad (lettuce, tomato, feta, olives, cucumbers, red onion) o Lentil soup with celery, carrots made with vegetable broth, garlic, salt and pepper  o Tabouli salad: parsley, bulgur, mint, scallions, cucumbers, tomato, radishes, lemon juice, olive oil, salt and pepper.    How to Increase Your Level of Physical Activity  Getting regular physical activity is important for your overall  health and well-being. Most people do not get enough exercise. There are easy ways to increase your level of physical activity, even if you have not been very active in the past or you are just starting out. Why is physical activity important? Physical activity has many short-term and long-term health benefits. Regular exercise can:  Help you lose weight or maintain a healthy weight.  Strengthen your muscles and bones.  Boost your mood and improve self-esteem.  Reduce your risk of certain long-term (chronic) diseases, like heart disease, cancer, and diabetes.  Help you stay capable of walking and moving around (mobile) as you age.  Prevent accidents, such as falls, as you age.  Increase life expectancy.  What are the benefits of being physically active on a regular basis? In addition to improving your physical health, being physically active on most days of the week can help you in ways that you may not expect. Benefits of regular physical activity may include:  Feeling good about your body.  Being able to move around more easily and for longer periods of time without getting tired (increased stamina).  Finding new sources of fun and enjoyment.  Meeting new people who share a common interest.  Being able to fight off illness better (enhanced immunity).  Being able to sleep better.  What can happen if I am not physically active on a regular basis? Not getting enough physical activity can lead to an unhealthy lifestyle and future health problems. This can increase your chances of:  Becoming overweight or obese.  Becoming sick.  Developing chronic illnesses, like heart disease or diabetes.  Having mental health problems, like depression or anxiety.  Having sleep problems.  Having trouble walking or getting yourself around (reduced mobility).  Injuring yourself in a fall as you get older.  What steps can I take to be more physically active?  Check with your health care  provider about how to get started. Ask your health care provider what activities are safe for you.  Start out slowly. Walking or doing some simple chair exercises is a good place to start, especially if you have not been active before or for a long time.  Try to find activities that you enjoy. You are more likely to commit to an exercise routine if it does not feel like a chore.  If you have bone or joint problems, choose low-impact exercises, like walking or swimming.  Include physical activity in your everyday routine.  Invite friends or family members to exercise with you. This also will help you commit to your workout plan.  Set goals that you can work toward.  Aim for at least 150 minutes of moderate-intensity exercise each week. Examples of moderate-intensity exercise include walking or riding a bike. Where  to find more information:  Centers for Disease Control and Prevention: JokeRule.co.uk  [employer] on [employer] www.http://smith-thompson.com/  ChooseMyPlate: MissedFlights.com.br Contact a health care provider if:  You have headaches, muscle aches, or joint pain.  You feel dizzy or light-headed while exercising.  You faint.  You have chest pain while exercising. Summary  Exercise benefits your mind and body at any age, even if you are just starting out.  If you have a chronic illness or have not been active for a while, check with your health care provider before increasing your physical activity.  Choose activities that are safe and enjoyable for you.Ask your health care provider what activities are safe for you.  Start slowly. Tell your health care provider if you have problems as you start to increase your activity level. This information is not intended to replace advice given to you by your health care provider. Make sure you discuss any questions you have with your health care  provider. Document Released: 11/10/2016 Document Revised: 11/10/2016 Document Reviewed: 11/10/2016 Elsevier Interactive Patient Education  [employer].

## 2018-02-21 NOTE — Progress Notes (Signed)
New patient office visit note:  Impression and Recommendations:    1. Adjustment disorder with other symptom   2. Family history of psychiatric disorder   3. ASCUS with positive high risk HPV cervical   4. Malodorous urine   5. Urinary urgency   6. Amenorrhea      1. Adjustment disorder with other symptom -restart wellbutrin- pt has been on this before and ran out of these meds about 1.5 months ago.    -strongly recommended to get daily exercise -discussed the many spokes of the wheel that correlate to your mood and wellbeing.   2. FMHx psychiatric disorder -ambulatory referral given to psychiatry and counseling  3. ASCUS with positive high risk HPV cervical -pt does not have any current gynecologist, but had UTD care by previous PCP. Will monitor closely.  4. Malodorous urine- 5. Urinary urgency -continue OTC azo. -check UA - will call pt if results abnormal. If so, abx will be called in.   6. Amenorrhea- -UPT ordered since getting abx for UTI. Per pt, missed 1 cycle recently. IUD was placed last year but still performed test.     Education and routine counseling performed. Handouts provided.  Orders Placed This Encounter  Procedures  . Urine Culture  . Ambulatory referral to Psychiatry  . Ambulatory referral to Psychology  . Ambulatory referral to Psychiatry  . POCT urinalysis dipstick  . POCT urine pregnancy    Meds ordered this encounter  Medications  . buPROPion (WELLBUTRIN XL) 150 MG 24 hr tablet    Sig: Take 1 tablet (150 mg total) by mouth every morning.    Dispense:  90 tablet    Refill:  0  . nitrofurantoin, macrocrystal-monohydrate, (MACROBID) 100 MG capsule    Sig: Take 1 capsule (100 mg total) by mouth 2 (two) times daily. For 5 days.    Dispense:  10 capsule    Refill:  0    Gross side effects, risk and benefits, and alternatives of medications discussed with patient.  Patient is aware that all medications have potential side effects  and we are unable to predict every side effect or drug-drug interaction that may occur.  Expresses verbal understanding and consents to current therapy plan and treatment regimen.  Return for Chronic OV w me near future & FBW 2-3d prior.  Please see AVS handed out to patient at the end of our visit for further patient instructions/ counseling done pertaining to today's office visit.    Note: This document was prepared using Dragon voice recognition software and may include unintentional dictation errors.  Pt was in the office today for 40+ minutes, with over 50% time spent in face to face counseling of patients various medical conditions, treatment plans of those medical conditions including medicine management and lifestyle modification, strategies to improve health and well being; and in coordination of care. SEE ABOVE FOR DETAILS.   This document serves as a record of services personally performed by Mellody Dance, DO. It was created on her behalf by Mayer Masker, a trained medical scribe. The creation of this record is based on the scribe's personal observations and the provider's statements to them.   I have reviewed the above medical documentation for accuracy and completeness and I concur.  Mellody Dance 02/27/18 8:23 AM  ----------------------------------------------------------------------------------------------------------------------    Subjective:    Chief complaint:   Chief Complaint  Patient presents with  . Establish Care     HPI: Erika Taylor is a pleasant  32 y.o. female who presents to [place] [facility] today to review their medical history with me and establish care.   I asked the patient to review their chronic problem list with me to ensure everything was updated and accurate.    All recent office visits with other providers, any medical records that patient brought in etc  - I reviewed today.     We asked pt to get [place] their  medical records from [facility] providers/ specialists that they had seen within the past 3-5 years- if they are in private practice and/or do not work for [employer], [facility], [place], [place] or [employer] owned practice.  Told them to call their specialists to clarify this if they are not sure.    Urinary She has urinary urgency and malodorous urine. She denies discharge.   Personal information She lives nearby in [place]. He has a 64 year old son. No partner currently- she has help from her mother.   She is adopted.  She has a Restaurant manager, fast food, Sam, who is 12, and she walks with him daily. She has limited time and does not enjoy working out at [employer].   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  She currently does not have health insurance.  Other providers Her other PCP was Otilio Jefferson PA at [facility], which was in [place], but she left the practice and her other replacement PCP was not as good. They did her gynecology care.   No gynecology.   She has seen a counselor before and they also had concerns about bipolar disorder.  PMHx She has had an irregular pap smear before, but not colposcopy yet.   Anxiety/depression- was on wellbutrin, but stopped because it had little to no effect anymore and she states she felt foggy and not herself. She has not tried any SSRI's. She states this is affecting her everyday life since she is not on meds anymore for 1.5 months or so. She also does not want to take medications twice daily.   She has ADD.   She has tried klonopin but did not like this. She states drugs have the opposite effect on her.   She reports losing over 50 lbs recently after changing her diet and exercise habits.  FMHx NA- pt was adopted. (she states her adopted mother actually adopted her biological mother when she was age 41, who gave birth to pt at age 37 and left the family, so her adopted mother has taken care of her since  birth)  Possible psychiatric conditions of unknown history  PSHx NA  SHx Smoker for 12 years- quit 3 years ago- started using vape to quit.  Social drinker 4-5 drinks once or twice a week, or 1-2 glass of wine throughout the week. Occasional marijuana use- on weekends.  She has one (not committed) monogamous sexual partner.   Wt Readings from Last 3 Encounters:  02/21/18 149 lb (67.6 kg)  12/08/17 150 lb (68 kg)  10/15/15 169 lb 14.4 oz (77.1 kg)   BP Readings from Last 3 Encounters:  02/21/18 122/83  12/08/17 (!) 152/83  10/10/17 (!) 135/92   Pulse Readings from Last 3 Encounters:  02/21/18 89  12/08/17 84  10/10/17 79   BMI Readings from Last 3 Encounters:  02/21/18 22.00 kg/m  12/08/17 22.15 kg/m  10/15/15 25.09 kg/m    Patient Care Team    Relationship Specialty Notifications Start End  Mellody Dance, DO PCP - General Family Medicine  02/22/18     Patient Active Problem List   Diagnosis Date Noted  . Adjustment disorder 02/21/2018  . ASCUS with positive high risk HPV cervical 08/01/2016  . Bilateral hand swelling 10/06/2015  . Polysubstance abuse ([place]) 10/04/2015  . Anxiety 12/24/2013     Past Medical History:  Diagnosis Date  . Depression      Past Medical History:  Diagnosis Date  . Depression      No past surgical history on file.   Family History  Adopted: Yes  Family history unknown: Yes     Social History   Substance and Sexual Activity  Drug Use No     Social History   Substance and Sexual Activity  Alcohol Use Yes  . Alcohol/week: 0.0 oz   Comment: social     Social History   Tobacco Use  Smoking Status Former Smoker  . Packs/day: 1.00  . Years: 12.00  . Pack years: 12.00  . Types: Cigarettes  . Start date: 09/14/2015  . Last attempt to quit: 2016  . Years since quitting: 3.2  Smokeless Tobacco Never Used     No outpatient medications have been marked as taking for the 02/21/18 encounter (Office  Visit) with Mellody Dance, DO.    Allergies: Penicillins   Review of Systems  Constitutional: Negative for chills, diaphoresis, fever, malaise/fatigue and weight loss.  HENT: Negative for congestion, sore throat and tinnitus.   Eyes: Negative for blurred vision, double vision and photophobia.  Respiratory: Negative for cough and wheezing.   Cardiovascular: Negative for chest pain and palpitations.  Gastrointestinal: Negative for blood in stool, diarrhea, nausea and vomiting.  Genitourinary: Negative for dysuria, frequency and urgency.  Musculoskeletal: Negative for joint pain and myalgias.  Skin: Negative for itching and rash.  Neurological: Negative for dizziness, focal weakness, weakness and headaches.  Endo/Heme/Allergies: Negative for environmental allergies and polydipsia. Does not bruise/bleed easily.  Psychiatric/Behavioral: Positive for depression. Negative for memory loss. The patient is nervous/anxious. The patient does not have insomnia.      Objective:   Blood pressure 122/83, pulse 89, height _0  (1.753 m), weight 149 lb (67.6 kg), SpO2 100 %. Body mass index is 22 kg/m. General: Well Developed, well nourished, and in no acute distress.  Neuro: Alert and oriented x3, extra-ocular muscles intact, sensation grossly intact.  HEENT:[place]/AT, PERRLA, neck supple, No carotid bruits Skin: no gross rashes  Cardiac: Regular rate and rhythm Respiratory: Essentially clear to auscultation bilaterally. Not using accessory muscles, speaking in full sentences.  Abdominal: not grossly distended Musculoskeletal: Ambulates w/o diff, FROM * 4 ext.  Vasc: less 2 sec cap RF, warm and pink  Psych:  No HI/SI, judgement and insight good, Euthymic mood. Full Affect.    Recent Results (from the past 2160 hour(s))  Protime-INR     Status: None   Collection Time: 12/08/17 11:50 PM  Result Value Ref Range   Prothrombin Time 13.5 11.4 - 15.2 seconds   INR 1.04   APTT     Status: None     Collection Time: 12/08/17 11:50 PM  Result Value Ref Range   aPTT 28 24 - 36 seconds  CBC     Status: Abnormal   Collection Time: 12/08/17 11:50 PM  Result Value Ref Range   WBC 7.6 4.0 - 10.5 K/uL   RBC 4.00 3.87 - 5.11 MIL/uL   Hemoglobin 12.6 12.0 - 15.0 g/dL   HCT 38.9 36.0 -  46.0 %   MCV 97.3 78.0 - 100.0 fL   MCH 31.5 26.0 - 34.0 pg   MCHC 32.4 30.0 - 36.0 g/dL   RDW 12.7 11.5 - 15.5 %   Platelets 418 (H) 150 - 400 K/uL  Differential     Status: None   Collection Time: 12/08/17 11:50 PM  Result Value Ref Range   Neutrophils Relative % 63 %   Neutro Abs 4.7 1.7 - 7.7 K/uL   Lymphocytes Relative 30 %   Lymphs Abs 2.3 0.7 - 4.0 K/uL   Monocytes Relative 6 %   Monocytes Absolute 0.4 0.1 - 1.0 K/uL   Eosinophils Relative 1 %   Eosinophils Absolute 0.1 0.0 - 0.7 K/uL   Basophils Relative 0 %   Basophils Absolute 0.0 0.0 - 0.1 K/uL  Comprehensive metabolic panel     Status: Abnormal   Collection Time: 12/08/17 11:50 PM  Result Value Ref Range   Sodium 139 135 - 145 mmol/L   Potassium 3.5 3.5 - 5.1 mmol/L   Chloride 105 101 - 111 mmol/L   CO2 23 22 - 32 mmol/L   Glucose, Bld 103 (H) 65 - 99 mg/dL   BUN <5 (L) 6 - 20 mg/dL   Creatinine, Ser 0.81 0.44 - 1.00 mg/dL   Calcium 9.1 8.9 - 10.3 mg/dL   Total Protein 7.5 6.5 - 8.1 g/dL   Albumin 4.3 3.5 - 5.0 g/dL   AST 16 15 - 41 U/L   ALT 12 (L) 14 - 54 U/L   Alkaline Phosphatase 38 38 - 126 U/L   Total Bilirubin 0.6 0.3 - 1.2 mg/dL   GFR calc non Af Amer >60 >60 mL/min   GFR calc Af Amer >60 >60 mL/min    Comment: (NOTE) The eGFR has been calculated using the CKD EPI equation. This calculation has not been validated in all clinical situations. eGFR's persistently <60 mL/min signify possible Chronic Kidney Disease.    Anion gap 11 5 - 15  I-Stat beta hCG blood, ED     Status: None   Collection Time: 12/09/17 12:01 AM  Result Value Ref Range   I-stat hCG, quantitative <5.0 <5 mIU/mL   Comment 3            Comment:    GEST. AGE      CONC.  (mIU/mL)   <=1 WEEK        5 - 50     2 WEEKS       50 - 500     3 WEEKS       100 - 10,000     4 WEEKS     1,000 - 30,000        FEMALE AND NON-PREGNANT FEMALE:     LESS THAN 5 mIU/mL   I-stat troponin, ED     Status: None   Collection Time: 12/09/17 12:04 AM  Result Value Ref Range   Troponin i, poc 0.00 0.00 - 0.08 ng/mL   Comment 3            Comment: Due to the release kinetics of cTnI, a negative result within the first hours of the onset of symptoms does not rule out myocardial infarction with certainty. If myocardial infarction is still suspected, repeat the test at appropriate intervals.   I-Stat Chem 8, ED     Status: Abnormal   Collection Time: 12/09/17 12:04 AM  Result Value Ref Range   Sodium 143 135 - 145 mmol/L  Potassium 3.7 3.5 - 5.1 mmol/L   Chloride 107 101 - 111 mmol/L   BUN 5 (L) 6 - 20 mg/dL   Creatinine, Ser 1.00 0.44 - 1.00 mg/dL   Glucose, Bld 101 (H) 65 - 99 mg/dL   Calcium, Ion 1.10 (L) 1.15 - 1.40 mmol/L   TCO2 24 22 - 32 mmol/L   Hemoglobin 12.9 12.0 - 15.0 g/dL   HCT 38.0 36.0 - 46.0 %  Urine Culture     Status: Abnormal   Collection Time: 02/21/18  4:24 PM  Result Value Ref Range   Urine Culture, Routine Final report (A)    Organism ID, Bacteria Escherichia coli (A)     Comment: 50,000-100,000 colony forming units per mL Cefazolin <=4 ug/mL Cefazolin with an MIC <=16 predicts susceptibility to the oral agents cefaclor, cefdinir, cefpodoxime, cefprozil, cefuroxime, cephalexin, and loracarbef when used for therapy of uncomplicated urinary tract infections due to E. coli, Klebsiella pneumoniae, and Proteus mirabilis.    Antimicrobial Susceptibility Comment     Comment:       ** S = Susceptible; I = Intermediate; R = Resistant **                    P = Positive; N = Negative             MICS are expressed in micrograms per mL    Antibiotic                 RSLT#1    RSLT#2    RSLT#3    RSLT#4 Amoxicillin/Clavulanic  Acid    S Ampicillin                     S Cefepime                       S Ceftriaxone                    S Cefuroxime                     S Ciprofloxacin                  S Ertapenem                      S Gentamicin                     S Imipenem                       S Levofloxacin                   S Meropenem                      S Nitrofurantoin                 S Piperacillin/Tazobactam        S Tetracycline                   S Tobramycin                     S Trimethoprim/Sulfa             S   POCT urinalysis dipstick     Status: Abnormal   Collection Time: 02/21/18  4:45 PM  Result Value Ref Range   Color, UA yellow    Clarity, UA clear    Glucose, UA negative    Bilirubin, UA negative    Ketones, UA trace    Spec Grav, UA 1.025 1.010 - 1.025   Blood, UA trace-intact    pH, UA 5.5 5.0 - 8.0   Protein, UA trace    Urobilinogen, UA 0.2 0.2 or 1.0 E.U./dL   Nitrite, UA positive    Leukocytes, UA Moderate (2+) (A) Negative   Appearance     Odor    POCT urine pregnancy     Status: Normal   Collection Time: 02/21/18  4:45 PM  Result Value Ref Range   Preg Test, Ur Negative Negative

## 2018-02-23 LAB — URINE CULTURE

## 2018-04-11 ENCOUNTER — Ambulatory Visit: Payer: Self-pay | Admitting: Psychology

## 2018-05-13 ENCOUNTER — Other Ambulatory Visit: Payer: Self-pay | Admitting: Family Medicine

## 2018-05-13 DIAGNOSIS — F4329 Adjustment disorder with other symptoms: Secondary | ICD-10-CM

## 2018-05-13 DIAGNOSIS — Z818 Family history of other mental and behavioral disorders: Secondary | ICD-10-CM

## 2018-05-24 ENCOUNTER — Ambulatory Visit (HOSPITAL_COMMUNITY)
Admission: EM | Admit: 2018-05-24 | Discharge: 2018-05-24 | Disposition: A | Discharge status: Home / Self Care | Payer: Self-pay | Attending: Family Medicine | Admitting: Family Medicine

## 2018-05-24 ENCOUNTER — Encounter (HOSPITAL_COMMUNITY): Payer: Self-pay | Admitting: Emergency Medicine

## 2018-05-24 DIAGNOSIS — N898 Other specified noninflammatory disorders of vagina: Secondary | ICD-10-CM

## 2018-05-24 DIAGNOSIS — R350 Frequency of micturition: Secondary | ICD-10-CM

## 2018-05-24 DIAGNOSIS — R300 Dysuria: Secondary | ICD-10-CM

## 2018-05-24 DIAGNOSIS — Z113 Encounter for screening for infections with a predominantly sexual mode of transmission: Secondary | ICD-10-CM

## 2018-05-24 DIAGNOSIS — Z3202 Encounter for pregnancy test, result negative: Secondary | ICD-10-CM

## 2018-05-24 LAB — POCT URINALYSIS DIP (DEVICE)
BILIRUBIN URINE: NEGATIVE
Glucose, UA: NEGATIVE mg/dL
Hgb urine dipstick: NEGATIVE
Ketones, ur: NEGATIVE mg/dL
LEUKOCYTES UA: NEGATIVE
Nitrite: NEGATIVE
PH: 5.5 (ref 5.0–8.0)
Protein, ur: NEGATIVE mg/dL
Specific Gravity, Urine: 1.015 (ref 1.005–1.030)
Urobilinogen, UA: 0.2 mg/dL (ref 0.0–1.0)

## 2018-05-24 LAB — POCT PREGNANCY, URINE: Preg Test, Ur: NEGATIVE

## 2018-05-24 MED ORDER — METRONIDAZOLE 500 MG PO TABS: 500.0000 mg | ORAL_TABLET | Freq: Two times a day (BID) | ORAL | 0 refills | Status: AC

## 2018-05-24 NOTE — Discharge Instructions (Addendum)
Begin metronidazole, twice daily for the next week.  Please do not drink alcohol while taking this medicine.  This is to treat bacterial vaginosis.  We are testing you for Gonorrhea, Chlamydia, Trichomonas, Yeast and Bacterial Vaginosis. We will call you if anything is positive and let you know if you require any further treatment. Please inform partners of any positive results.   Please return if symptoms not improving with treatment, development of fever, nausea, vomiting, abdominal pain.

## 2018-05-24 NOTE — ED Provider Notes (Signed)
[facility]    CSN: [id number] Arrival date & time: 05/24/18  1956     History   Chief Complaint Chief Complaint  Patient presents with  . Urinary Tract Infection    HPI Erika Taylor is a 32 y.o. female presenting today for evaluation of UTI symptoms and discharge.  Patient has had dysuria and increased frequency.  She also notes that she has had some discharge.  Is unsure fevers, but notes that she has had chills.  Previously has had BV and feels this is similar.  Also noting odor.  She is accompanied here by her boyfriend and both have questions about BV.  HPI  Past Medical History:  Diagnosis Date  . Depression     Patient Active Problem List   Diagnosis Date Noted  . Adjustment disorder 02/21/2018  . ASCUS with positive high risk HPV cervical 08/01/2016  . Bilateral hand swelling 10/06/2015  . Polysubstance abuse ([facility]) 10/04/2015  . Anxiety 12/24/2013    History reviewed. No pertinent surgical history.  OB History   None      Home Medications    Prior to Admission medications   Medication Sig Start Date End Date Taking? Authorizing Provider  buPROPion (WELLBUTRIN XL) 150 MG 24 hr tablet TAKE 1 TABLET BY MOUTH EVERY DAY IN THE MORNING 05/14/18  Yes Opalski, Gavin Pound, DO  metroNIDAZOLE (FLAGYL) 500 MG tablet Take 1 tablet (500 mg total) by mouth 2 (two) times daily for 7 days. 05/24/18 05/31/18  Wieters, Junius Creamer, PA-C    Family History Family History  Adopted: Yes  Family history unknown: Yes    Social History Social History   Tobacco Use  . Smoking status: Former Smoker    Packs/day: 1.00    Years: 12.00    Pack years: 12.00    Types: Cigarettes    Start date: 09/14/2015    Last attempt to quit: 2016    Years since quitting: 3.4  . Smokeless tobacco: Never Used  Substance Use Topics  . Alcohol use: Yes    Alcohol/week: 0.0 oz    Comment: social  . Drug use: No     Allergies   Penicillins   Review of Systems Review of  Systems  Constitutional: Negative for fever.  Respiratory: Negative for shortness of breath.   Cardiovascular: Negative for chest pain.  Gastrointestinal: Negative for abdominal pain, diarrhea, nausea and vomiting.  Genitourinary: Positive for dysuria, frequency and vaginal discharge. Negative for flank pain, genital sores, hematuria, menstrual problem, vaginal bleeding and vaginal pain.  Musculoskeletal: Negative for back pain.  Skin: Negative for rash.  Neurological: Negative for dizziness, light-headedness and headaches.     Physical Exam Triage Vital Signs ED Triage Vitals  Enc Vitals Group     BP 05/24/18 2006 (!) 150/124     Pulse --      Resp 05/24/18 2006 14     Temp 05/24/18 2006 97.9 F (36.6 C)     Temp Source 05/24/18 2006 Oral     SpO2 05/24/18 2006 100 %     Weight --      Height --      Head Circumference --      Peak Flow --      Pain Score 05/24/18 2008 0     Pain Loc --      Pain Edu? --      Excl. in GC? --    No data found.  Updated Vital Signs BP (!) 150/124 (BP  Location: Left Arm)   Temp 97.9 F (36.6 C) (Oral)   Resp 14   SpO2 100%   Visual Acuity Right Eye Distance:   Left Eye Distance:   Bilateral Distance:    Right Eye Near:   Left Eye Near:    Bilateral Near:     Physical Exam  Constitutional: She appears well-developed and well-nourished. No distress.  HENT:  Head: Normocephalic and atraumatic.  Eyes: Conjunctivae are normal.  Neck: Neck supple.  Cardiovascular: Normal rate and regular rhythm.  No murmur heard. Pulmonary/Chest: Effort normal and breath sounds normal. No respiratory distress.  Abdominal: Soft. There is no tenderness.  Musculoskeletal: She exhibits no edema.  Neurological: She is alert.  Skin: Skin is warm and dry.  Psychiatric: She has a normal mood and affect.  Nursing note and vitals reviewed.    UC Treatments / Results  Labs (all labs ordered are listed, but only abnormal results are displayed) Labs  Reviewed  POCT URINALYSIS DIP (DEVICE)  POCT PREGNANCY, URINE  CERVICOVAGINAL ANCILLARY ONLY    EKG None  Radiology No results found.  Procedures Procedures (including critical care time)  Medications Ordered in UC Medications - No data to display  Initial Impression / Assessment and Plan / UC Course  I have reviewed the triage vital signs and the nursing notes.  Pertinent labs & imaging results that were available during my care of the patient were reviewed by me and considered in my medical decision making (see chart for details).     Patient with vaginal discharge, will go ahead and empirically treat for BV with metronidazole.  Vaginal swab obtained to check for STDs as well as to confirm BV.  UA unremarkable.  Discussed BV transmission with patient and her boyfriend. Final Clinical Impressions(s) / UC Diagnoses   Final diagnoses:  Vaginal discharge     Discharge Instructions     Begin metronidazole, twice daily for the next week.  Please do not drink alcohol while taking this medicine.  This is to treat bacterial vaginosis.  We are testing you for Gonorrhea, Chlamydia, Trichomonas, Yeast and Bacterial Vaginosis. We will call you if anything is positive and let you know if you require any further treatment. Please inform partners of any positive results.   Please return if symptoms not improving with treatment, development of fever, nausea, vomiting, abdominal pain.      ED Prescriptions    Medication Sig Dispense Auth. Provider   metroNIDAZOLE (FLAGYL) 500 MG tablet Take 1 tablet (500 mg total) by mouth 2 (two) times daily for 7 days. 14 tablet Wieters, Buckingham CourthouseHallie C, PA-C     Controlled Substance Prescriptions [place] Controlled Substance Registry consulted? Not Applicable   Lew DawesWieters, Hallie C, [place] JerseyPA-C 05/24/18 2034

## 2018-05-24 NOTE — ED Triage Notes (Signed)
Pt c/o urinary symptoms including dysuria, some discharge and frequent urination. Pt reports she has had chills.

## 2018-05-28 ENCOUNTER — Telehealth (HOSPITAL_COMMUNITY): Payer: Self-pay

## 2018-05-28 LAB — CERVICOVAGINAL ANCILLARY ONLY
Bacterial vaginitis: POSITIVE — AB
CANDIDA VAGINITIS: NEGATIVE
Chlamydia: NEGATIVE
Neisseria Gonorrhea: NEGATIVE
Trichomonas: NEGATIVE

## 2018-05-28 NOTE — Telephone Encounter (Signed)
Bacterial Vaginosis test is positive.  Prescription for metronidazole was given at the urgent care visit. Attempted to reach patient. No answer at this time. 

## 2018-05-29 ENCOUNTER — Telehealth (HOSPITAL_COMMUNITY): Payer: Self-pay

## 2018-05-29 NOTE — Telephone Encounter (Signed)
Bacterial Vaginosis test is positive.  Prescription for metronidazole was given at the urgent care visit. Pt contacted regarding results. Answered all questions. Verbalized understanding.   

## 2018-09-10 ENCOUNTER — Other Ambulatory Visit: Payer: Self-pay

## 2018-09-10 ENCOUNTER — Other Ambulatory Visit: Payer: Self-pay | Admitting: Family Medicine

## 2018-09-10 DIAGNOSIS — Z818 Family history of other mental and behavioral disorders: Secondary | ICD-10-CM

## 2018-09-10 DIAGNOSIS — F4329 Adjustment disorder with other symptoms: Secondary | ICD-10-CM

## 2018-09-10 MED ORDER — BUPROPION HCL ER (XL) 150 MG PO TB24: ORAL_TABLET | ORAL | 0 refills | Status: DC

## 2018-09-10 MED ORDER — BUPROPION HCL ER (XL) 150 MG PO TB24
ORAL_TABLET | ORAL | 0 refills | Status: DC
Start: 2018-09-10 — End: 2018-09-10

## 2018-09-13 ENCOUNTER — Telehealth: Payer: Self-pay | Admitting: Family Medicine

## 2018-09-13 NOTE — Telephone Encounter (Signed)
Left patient message for Provider required OV-- Forwarding message to medical assistant.  -glh

## 2018-09-14 ENCOUNTER — Emergency Department: Payer: Medicaid Other

## 2018-09-14 ENCOUNTER — Emergency Department
Admission: EM | Admit: 2018-09-14 | Discharge: 2018-09-14 | Disposition: A | Discharge status: Other Acute Inpatient Hospital | Payer: Medicaid Other | Attending: Emergency Medicine | Admitting: Emergency Medicine

## 2018-09-14 ENCOUNTER — Encounter: Payer: Self-pay | Admitting: Emergency Medicine

## 2018-09-14 DIAGNOSIS — Z79899 Other long term (current) drug therapy: Secondary | ICD-10-CM | POA: Diagnosis not present

## 2018-09-14 DIAGNOSIS — R4781 Slurred speech: Secondary | ICD-10-CM | POA: Insufficient documentation

## 2018-09-14 DIAGNOSIS — Y998 Other external cause status: Secondary | ICD-10-CM | POA: Diagnosis not present

## 2018-09-14 DIAGNOSIS — J982 Interstitial emphysema: Secondary | ICD-10-CM | POA: Diagnosis not present

## 2018-09-14 DIAGNOSIS — Z87891 Personal history of nicotine dependence: Secondary | ICD-10-CM | POA: Diagnosis not present

## 2018-09-14 DIAGNOSIS — F329 Major depressive disorder, single episode, unspecified: Secondary | ICD-10-CM | POA: Diagnosis not present

## 2018-09-14 DIAGNOSIS — Y929 Unspecified place or not applicable: Secondary | ICD-10-CM | POA: Insufficient documentation

## 2018-09-14 DIAGNOSIS — S32009A Unspecified fracture of unspecified lumbar vertebra, initial encounter for closed fracture: Secondary | ICD-10-CM | POA: Insufficient documentation

## 2018-09-14 DIAGNOSIS — Y9389 Activity, other specified: Secondary | ICD-10-CM | POA: Insufficient documentation

## 2018-09-14 DIAGNOSIS — F419 Anxiety disorder, unspecified: Secondary | ICD-10-CM | POA: Diagnosis not present

## 2018-09-14 DIAGNOSIS — S3992XA Unspecified injury of lower back, initial encounter: Secondary | ICD-10-CM | POA: Diagnosis present

## 2018-09-14 LAB — CBC WITH DIFFERENTIAL/PLATELET
Abs Immature Granulocytes: 0.05 10*3/uL (ref 0.00–0.07)
Basophils Absolute: 0.1 10*3/uL (ref 0.0–0.1)
Basophils Relative: 1 %
EOS ABS: 0.4 10*3/uL (ref 0.0–0.5)
EOS PCT: 3 %
HEMATOCRIT: 37.6 % (ref 36.0–46.0)
HEMOGLOBIN: 12.4 g/dL (ref 12.0–15.0)
Immature Granulocytes: 0 %
LYMPHS ABS: 2.5 10*3/uL (ref 0.7–4.0)
Lymphocytes Relative: 19 %
MCH: 32.8 pg (ref 26.0–34.0)
MCHC: 33 g/dL (ref 30.0–36.0)
MCV: 99.5 fL (ref 80.0–100.0)
MONO ABS: 0.7 10*3/uL (ref 0.1–1.0)
Monocytes Relative: 5 %
Neutro Abs: 9.8 10*3/uL — ABNORMAL HIGH (ref 1.7–7.7)
Neutrophils Relative %: 72 %
Platelets: 387 10*3/uL (ref 150–400)
RBC: 3.78 MIL/uL — ABNORMAL LOW (ref 3.87–5.11)
RDW: 12.1 % (ref 11.5–15.5)
WBC: 13.6 10*3/uL — AB (ref 4.0–10.5)
nRBC: 0 % (ref 0.0–0.2)

## 2018-09-14 LAB — COMPREHENSIVE METABOLIC PANEL
ALK PHOS: 39 U/L (ref 38–126)
ALT: 8 U/L (ref 0–44)
ANION GAP: 11 (ref 5–15)
AST: 20 U/L (ref 15–41)
Albumin: 4.2 g/dL (ref 3.5–5.0)
BILIRUBIN TOTAL: 0.6 mg/dL (ref 0.3–1.2)
BUN: 9 mg/dL (ref 6–20)
CALCIUM: 8.4 mg/dL — AB (ref 8.9–10.3)
CO2: 22 mmol/L (ref 22–32)
CREATININE: 0.72 mg/dL (ref 0.44–1.00)
Chloride: 110 mmol/L (ref 98–111)
GFR calc non Af Amer: >60 mL/min (ref >60)
GLUCOSE: 85 mg/dL (ref 70–99)
Potassium: 3.6 mmol/L (ref 3.5–5.1)
Sodium: 143 mmol/L (ref 135–145)
TOTAL PROTEIN: 7.5 g/dL (ref 6.5–8.1)

## 2018-09-14 LAB — ETHANOL: Alcohol, Ethyl (B): 114 mg/dL — ABNORMAL HIGH (ref <10)

## 2018-09-14 LAB — HCG, QUANTITATIVE, PREGNANCY: hCG, Beta Chain, Quant, S: <1 m[IU]/mL (ref <5)

## 2018-09-14 MED ORDER — FENTANYL CITRATE (PF) 100 MCG/2ML IJ SOLN
INTRAMUSCULAR | Status: AC
  Filled 2018-09-14: qty 2

## 2018-09-14 MED ORDER — FENTANYL CITRATE (PF) 100 MCG/2ML IJ SOLN
INTRAMUSCULAR | Status: AC
  Administered 2018-09-14: 50 ug via INTRAVENOUS
  Filled 2018-09-14: qty 2

## 2018-09-14 MED ORDER — KETOROLAC TROMETHAMINE 30 MG/ML IJ SOLN
15.0000 mg | Freq: Once | INTRAMUSCULAR | Status: AC
  Administered 2018-09-14: 15 mg via INTRAVENOUS
  Filled 2018-09-14: qty 1

## 2018-09-14 MED ORDER — IOPAMIDOL (ISOVUE-300) INJECTION 61%
100.0000 mL | Freq: Once | INTRAVENOUS | Status: AC | PRN
  Administered 2018-09-14: 100 mL via INTRAVENOUS

## 2018-09-14 MED ORDER — SODIUM CHLORIDE 0.9 % IV BOLUS
1000.0000 mL | Freq: Once | INTRAVENOUS | Status: AC
  Administered 2018-09-14: 1000 mL via INTRAVENOUS

## 2018-09-14 MED ORDER — FENTANYL CITRATE (PF) 100 MCG/2ML IJ SOLN
50.0000 ug | Freq: Once | INTRAMUSCULAR | Status: AC
  Administered 2018-09-14: 50 ug via INTRAVENOUS

## 2018-09-14 MED ORDER — FENTANYL CITRATE (PF) 100 MCG/2ML IJ SOLN
75.0000 ug | Freq: Once | INTRAMUSCULAR | Status: AC
  Administered 2018-09-14: 75 ug via INTRAVENOUS

## 2018-09-14 MED ORDER — HALOPERIDOL LACTATE 5 MG/ML IJ SOLN
2.5000 mg | Freq: Once | INTRAMUSCULAR | Status: AC
  Administered 2018-09-14: 2.5 mg via INTRAVENOUS
  Filled 2018-09-14: qty 1

## 2018-09-14 MED ORDER — ONDANSETRON HCL 4 MG/2ML IJ SOLN
4.0000 mg | Freq: Once | INTRAMUSCULAR | Status: AC
  Administered 2018-09-14: 4 mg via INTRAVENOUS
  Filled 2018-09-14: qty 2

## 2018-09-14 NOTE — ED Notes (Signed)
Family with name of Josh calling sts, "I am her boyfriend and I want to know where she is and what is going on with her." This RN let "Josh" know that it was against [facility] to give out any information on any pt without the permission of pt. "Josh" hung up phone.

## 2018-09-14 NOTE — ED Notes (Signed)
Per Dr. Lamont Snowball, pt okay to drink. Pt given cup of water, repositioned in bed for comfort, and given warm blanket by this RN. Pt visualized in bed as uncomfortable, denies further needs at this time. Care update given to Primary RN, Vikki Ports.

## 2018-09-14 NOTE — ED Notes (Signed)
Pt left with 1 belonging bag.
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
## 2018-09-14 NOTE — ED Notes (Signed)
Pt alert and oriented. VSS.  Waiting on transfer to [facility]. NAD. Unlabored. Skin warm and dry

## 2018-09-14 NOTE — ED Notes (Signed)
Patient transported to CT 

## 2018-09-14 NOTE — ED Notes (Signed)
Family by name of "Jonny Ruiz" called and sts, "I am the step dad and I work out of state. I need to know is [place] is there and if what is wrong with her." Pt asked and informed this RN that she doesn't have a step father named Jonny Ruiz. John continued to yell, cuss and stated to this RN, "you can go fuck yourself because I am her daddy and you wont tell me anything, you bitch." John informed that we are unable to provide any information on a pt that doesn't give permission to do so. John hung up.
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
## 2018-09-19 ENCOUNTER — Telehealth: Payer: Self-pay
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
## 2018-09-19 NOTE — Telephone Encounter (Signed)
Called patient left message for her to call the office in the morning.  Per Dr. Sharee Holster patient may come in tomorrow at 430 if needed and we don't have anything else for the patient. MPulliam, CMA/RT(R)

## 2018-09-20 ENCOUNTER — Ambulatory Visit (INDEPENDENT_AMBULATORY_CARE_PROVIDER_SITE_OTHER): Payer: Medicaid Other | Admitting: Family Medicine

## 2018-09-20 ENCOUNTER — Encounter: Payer: Self-pay | Admitting: Family Medicine

## 2018-09-20 VITALS — BP 122/77 | HR 80 | Ht 69.0 in | Wt 152.5 lb

## 2018-09-20 DIAGNOSIS — F4329 Adjustment disorder with other symptoms: Secondary | ICD-10-CM

## 2018-09-20 DIAGNOSIS — T7491XA Unspecified adult maltreatment, confirmed, initial encounter: Secondary | ICD-10-CM

## 2018-09-20 DIAGNOSIS — Z975 Presence of (intrauterine) contraceptive device: Secondary | ICD-10-CM

## 2018-09-20 DIAGNOSIS — S22089D Unspecified fracture of T11-T12 vertebra, subsequent encounter for fracture with routine healing: Secondary | ICD-10-CM | POA: Diagnosis not present

## 2018-09-20 DIAGNOSIS — M546 Pain in thoracic spine: Secondary | ICD-10-CM

## 2018-09-20 DIAGNOSIS — Z202 Contact with and (suspected) exposure to infections with a predominantly sexual mode of transmission: Secondary | ICD-10-CM

## 2018-09-20 DIAGNOSIS — S32019A Unspecified fracture of first lumbar vertebra, initial encounter for closed fracture: Secondary | ICD-10-CM

## 2018-09-20 DIAGNOSIS — F4310 Post-traumatic stress disorder, unspecified: Secondary | ICD-10-CM

## 2018-09-20 DIAGNOSIS — S32029A Unspecified fracture of second lumbar vertebra, initial encounter for closed fracture: Secondary | ICD-10-CM | POA: Diagnosis not present

## 2018-09-20 MED ORDER — AMITRIPTYLINE HCL 25 MG PO TABS: 50.0000 mg | ORAL_TABLET | Freq: Every day | ORAL | 0 refills | Status: DC

## 2018-09-20 MED ORDER — BUPROPION HCL ER (XL) 150 MG PO TB24: 300.0000 mg | ORAL_TABLET | Freq: Every day | ORAL | 1 refills | Status: DC

## 2018-09-20 MED ORDER — PREGABALIN 75 MG PO CAPS: 75.0000 mg | ORAL_CAPSULE | Freq: Two times a day (BID) | ORAL | 3 refills | Status: DC

## 2018-09-20 NOTE — Patient Instructions (Addendum)
Please follow-up tomorrow for additional concerns.  Please be here at 1045.  Is a mention we are sending you to a neurosurgeon\spinal specialist.  Also we are sending you to behavioral health for counseling.   Domestic Violence Information What is domestic violence? Domestic violence, also called intimate partner violence, can involve physical, emotional, psychological, sexual, and economic abuse by a current or former intimate partner. Stalking is also considered a type of domestic violence. Domestic violence can happen between people who are or were:  Married.  Dating.  Living together.  Abusers repeatedly act to maintain control and power over their partner. Physical abuse Physical abuse can include:  Slapping.  Hitting.  Kicking.  Punching.  Choking.  Pulling the victim's hair.  Damaging the victim's property.  Threatening or hurting the victim with weapons.  Trapping the victim in his or her home.  Forcing the victim to use drugs or alcohol.  Emotional and psychological abuse Emotional and psychological abuse can include:  Threats.  Insults.  Isolation.  Humiliation.  Jealousy and possessiveness.  Blame.  Withholding affection.  Intimidation.  Manipulation.  Limiting contact with friends and family.  Sexual abuse Sexual abuse can include:  Forcing sex.  Forcing sexual touching.  Hurting the victim during sex.  Forcing the victim to have sex with other people.  Giving the victim a sexually transmitted disease (STD) on purpose.  Economic abuse Economic abuse can include:  Controlling resources, such as money, food, transportation, a phone, or computer.  Stealing money from the victim or his or her family or friends.  Forbidding the victim to work.  Refusing to work or to contribute to the household.  Stalking Stalking can include:  Making repeated, unwanted phone calls, emails, or text messages.  Leaving cards, letters,  flowers or other items the victim does not want.  Watching or following the victim from a distance.  Going to places where the victim does not want the abuser.  Entering the victim's home or car.  Damaging the victim's personal property.  What are some warning signs of domestic violence? Physical signs  Bruises.  Broken bones.  Burns or cuts.  Physical pain.  Head injury. Emotional and psychological signs  Crying.  Depression.  Hopelessness.  Desperation.  Trouble sleeping.  Fear of partner.  Anxiety.  Suicidal behavior.  Antisocial behavior.  Low self-esteem.  Fear of intimacy.  Flashbacks. Sexual signs  Bruising, swelling, or bleeding of the genital or rectal area.  Signs of a sexually transmitted infection, such as genital sores, warts, or discharge coming from the genital area.  Pain in the genital area.  Unintended pregnancy.  Problems with pregnancy, including delayed prenatal care and prematurity. Economic signs  Having little money or food.  Homelessness.  Asking for or borrowing money. What are common behaviors of those affected by domestic violence? Those affected by domestic violence may:  Be late to work or other events.  Not show up to places as promised.  Have to let their partner know where they are and who they are with.  Be isolated or kept from seeing friends or family.  Make comments about their partner's temper or behavior.  Make excuses for their partner.  Engage in high-risk sexual behaviors.  Use drugs or alcohol.  Have unhealthy diet-related behaviors.  What are common feelings of those affected by domestic violence? Those affected by domestic violence may feel that:  They have to be careful not to say or do things that trigger their partner's  anger.  They cannot do anything right.  They deserve to be treated badly.  They are overreacting to their partner's behavior or temper.  They cannot trust  their own feelings.  They cannot trust other people.  They are trapped.  Their partner would take away their children.  They are emotionally drained or numb.  Their life is in danger.  They might have to kill their partner to survive.  Where can you get help? Domestic violence hotlines and websites If you do not feel safe searching for help online at home, use a computer at [employer] to access [employer]. Call 911 if you are in immediate danger or need medical help.  [employer]. ? The 24-hour phone hotline is 831-036-0972 or (548) 093-0247 (TTY). ? The videophone is available Monday through Friday, 9 a.m. to 5 p.m. Call (610)573-3712. ? The website is http://thehotline.org  The National Sexual Assault Hotline. ? The 24-hour phone hotline is 873 554 7623. ? You can access the online hotline at MagicWines.nl  Shelters for victims of domestic violence If you are a victim of domestic violence, there are resources to help you find a temporary place for you and your children to live (shelter). The specific address of these shelters is often not known to the public. Police Report assaults, threats, and stalking to the police. Counselors and counseling centers People who have been victims of domestic violence can benefit from counseling. Counseling can help you cope with difficult emotions and empower you to plan for your future safety. The topics you discuss with a counselor are private and confidential. Children of domestic violence victims also might need counseling to manage stress and anxiety. The court system You can work with a Clinical research associate or an advocate to get legal protection against an abuser. Protection includes restraining orders and private addresses. Crimes against you, such as assault, can also be prosecuted through the courts. Laws vary by state. This information is not intended to replace advice given to you by your  health care provider. Make sure you discuss any questions you have with your health care provider. Document Released: 02/11/2004 Document Revised: 11/04/2016 Document Reviewed: 08/08/2014 Elsevier Interactive Patient Education  2018 [employer].   Domestic Violence Information What is domestic violence? Domestic violence, also called intimate partner violence, can involve physical, emotional, psychological, sexual, and economic abuse by a current or former intimate partner. Stalking is also considered a type of domestic violence. Domestic violence can happen between people who are or were:  Married.  Dating.  Living together.  Abusers repeatedly act to maintain control and power over their partner. Physical abuse Physical abuse can include:  Slapping.  Hitting.  Kicking.  Punching.  Choking.  Pulling the victim's hair.  Damaging the victim's property.  Threatening or hurting the victim with weapons.  Trapping the victim in his or her home.  Forcing the victim to use drugs or alcohol.  Emotional and psychological abuse Emotional and psychological abuse can include:  Threats.  Insults.  Isolation.  Humiliation.  Jealousy and possessiveness.  Blame.  Withholding affection.  Intimidation.  Manipulation.  Limiting contact with friends and family.  Sexual abuse Sexual abuse can include:  Forcing sex.  Forcing sexual touching.  Hurting the victim during sex.  Forcing the victim to have sex with other people.  Giving the victim a sexually transmitted disease (STD) on purpose.  Economic abuse Economic abuse can include:  Controlling resources, such as money, food, transportation, a phone, or  computer.  Stealing money from the victim or his or her family or friends.  Forbidding the victim to work.  Refusing to work or to contribute to the household.  Stalking Stalking can include:  Making repeated, unwanted phone calls, emails, or text  messages.  Leaving cards, letters, flowers or other items the victim does not want.  Watching or following the victim from a distance.  Going to places where the victim does not want the abuser.  Entering the victim's home or car.  Damaging the victim's personal property.  What are some warning signs of domestic violence? Physical signs  Bruises.  Broken bones.  Burns or cuts.  Physical pain.  Head injury. Emotional and psychological signs  Crying.  Depression.  Hopelessness.  Desperation.  Trouble sleeping.  Fear of partner.  Anxiety.  Suicidal behavior.  Antisocial behavior.  Low self-esteem.  Fear of intimacy.  Flashbacks. Sexual signs  Bruising, swelling, or bleeding of the genital or rectal area.  Signs of a sexually transmitted infection, such as genital sores, warts, or discharge coming from the genital area.  Pain in the genital area.  Unintended pregnancy.  Problems with pregnancy, including delayed prenatal care and prematurity. Economic signs  Having little money or food.  Homelessness.  Asking for or borrowing money. What are common behaviors of those affected by domestic violence? Those affected by domestic violence may:  Be late to work or other events.  Not show up to places as promised.  Have to let their partner know where they are and who they are with.  Be isolated or kept from seeing friends or family.  Make comments about their partner's temper or behavior.  Make excuses for their partner.  Engage in high-risk sexual behaviors.  Use drugs or alcohol.  Have unhealthy diet-related behaviors.  What are common feelings of those affected by domestic violence? Those affected by domestic violence may feel that:  They have to be careful not to say or do things that trigger their partner's anger.  They cannot do anything right.  They deserve to be treated badly.  They are overreacting to their partner's  behavior or temper.  They cannot trust their own feelings.  They cannot trust other people.  They are trapped.  Their partner would take away their children.  They are emotionally drained or numb.  Their life is in danger.  They might have to kill their partner to survive.  Where can you get help? Domestic violence hotlines and websites If you do not feel safe searching for help online at home, use a computer at [employer] to access [employer]. Call 911 if you are in immediate danger or need medical help.  [employer]. ? The 24-hour phone hotline is (330)637-6200 or 269-430-8866 (TTY). ? The videophone is available Monday through Friday, 9 a.m. to 5 p.m. Call 779-245-7318. ? The website is http://thehotline.org  The National Sexual Assault Hotline. ? The 24-hour phone hotline is (337)264-8999. ? You can access the online hotline at MagicWines.nl  Shelters for victims of domestic violence If you are a victim of domestic violence, there are resources to help you find a temporary place for you and your children to live (shelter). The specific address of these shelters is often not known to the public. Police Report assaults, threats, and stalking to the police. Counselors and counseling centers People who have been victims of domestic violence can benefit from counseling. Counseling can help you cope with difficult  emotions and empower you to plan for your future safety. The topics you discuss with a counselor are private and confidential. Children of domestic violence victims also might need counseling to manage stress and anxiety. The court system You can work with a Clinical research associate or an advocate to get legal protection against an abuser. Protection includes restraining orders and private addresses. Crimes against you, such as assault, can also be prosecuted through the courts. Laws vary by state. This information is not intended to  replace advice given to you by your health care provider. Make sure you discuss any questions you have with your health care provider. Document Released: 02/11/2004 Document Revised: 11/04/2016 Document Reviewed: 08/08/2014 Elsevier Interactive Patient Education  2018 [employer].
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
## 2018-09-20 NOTE — Telephone Encounter (Signed)
Patient was seen in office today 09/20/2018. MPulliam, CMA/RT(R)

## 2018-09-21 ENCOUNTER — Ambulatory Visit (INDEPENDENT_AMBULATORY_CARE_PROVIDER_SITE_OTHER): Payer: Medicaid Other | Admitting: Family Medicine

## 2018-09-21 ENCOUNTER — Encounter: Payer: Self-pay | Admitting: Family Medicine

## 2018-09-21 ENCOUNTER — Ambulatory Visit: Payer: Medicaid Other | Admitting: Family Medicine

## 2018-09-21 VITALS — BP 123/74 | HR 79 | Ht 69.0 in | Wt 149.0 lb

## 2018-09-21 DIAGNOSIS — M546 Pain in thoracic spine: Secondary | ICD-10-CM

## 2018-09-21 DIAGNOSIS — B3731 Acute candidiasis of vulva and vagina: Secondary | ICD-10-CM

## 2018-09-21 DIAGNOSIS — S22089D Unspecified fracture of T11-T12 vertebra, subsequent encounter for fracture with routine healing: Secondary | ICD-10-CM

## 2018-09-21 DIAGNOSIS — N898 Other specified noninflammatory disorders of vagina: Secondary | ICD-10-CM | POA: Diagnosis not present

## 2018-09-21 DIAGNOSIS — S32019A Unspecified fracture of first lumbar vertebra, initial encounter for closed fracture: Secondary | ICD-10-CM

## 2018-09-21 DIAGNOSIS — N9489 Other specified conditions associated with female genital organs and menstrual cycle: Secondary | ICD-10-CM

## 2018-09-21 DIAGNOSIS — G4700 Insomnia, unspecified: Secondary | ICD-10-CM

## 2018-09-21 DIAGNOSIS — B373 Candidiasis of vulva and vagina: Secondary | ICD-10-CM

## 2018-09-21 DIAGNOSIS — S32029A Unspecified fracture of second lumbar vertebra, initial encounter for closed fracture: Secondary | ICD-10-CM

## 2018-09-21 DIAGNOSIS — N949 Unspecified condition associated with female genital organs and menstrual cycle: Secondary | ICD-10-CM

## 2018-09-21 DIAGNOSIS — Z202 Contact with and (suspected) exposure to infections with a predominantly sexual mode of transmission: Secondary | ICD-10-CM | POA: Diagnosis not present

## 2018-09-21 DIAGNOSIS — K130 Diseases of lips: Secondary | ICD-10-CM

## 2018-09-21 MED ORDER — CLOTRIMAZOLE-BETAMETHASONE 1-0.05 % EX CREA: 1.0000 "application " | TOPICAL_CREAM | Freq: Two times a day (BID) | CUTANEOUS | 0 refills | Status: DC

## 2018-09-21 MED ORDER — AMITRIPTYLINE HCL 25 MG PO TABS: ORAL_TABLET | ORAL | 0 refills | Status: DC

## 2018-09-21 MED ORDER — ZOLPIDEM TARTRATE 10 MG PO TABS: 10.0000 mg | ORAL_TABLET | Freq: Every evening | ORAL | 3 refills | Status: DC | PRN

## 2018-09-21 MED ORDER — FLUCONAZOLE 150 MG PO TABS: 150.0000 mg | ORAL_TABLET | Freq: Once | ORAL | 0 refills | Status: DC

## 2018-09-21 MED ORDER — PREGABALIN 75 MG PO CAPS: 75.0000 mg | ORAL_CAPSULE | Freq: Two times a day (BID) | ORAL | 3 refills | Status: DC

## 2018-09-21 MED ORDER — FLUCONAZOLE 150 MG PO TABS: 150.0000 mg | ORAL_TABLET | Freq: Once | ORAL | 0 refills | Status: AC

## 2018-09-21 NOTE — Patient Instructions (Signed)
Please take note of how we changed your amitriptyline prescription.  Since we are giving you the Ambien, please use the amitriptyline 1 tablet 3 times a day as needed for pain or anxiety.  You will take the zolpidem\Ambien as needed at night.  Please start with one half a tablet and take a full tablet if needed.  It will take 3-5 business days to hear back about your labs, we will call you once those are resulted and treat if needed.
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
## 2018-09-25 LAB — HSV(HERPES SMPLX)ABS-I+II(IGG+IGM)-BLD: HSVI/II Comb IgM: <0.91 Ratio (ref 0.00–0.90)

## 2018-09-25 LAB — HIV ANTIBODY (ROUTINE TESTING W REFLEX): HIV Screen 4th Generation wRfx: NONREACTIVE

## 2018-09-25 LAB — NUSWAB VAGINITIS PLUS (VG+)
Atopobium vaginae: HIGH Score — AB
BVAB 2: HIGH {score} — AB
CANDIDA ALBICANS, NAA: POSITIVE — AB
Candida glabrata, NAA: NEGATIVE
Chlamydia trachomatis, NAA: NEGATIVE
MEGASPHAERA 1: HIGH {score} — AB
Neisseria gonorrhoeae, NAA: NEGATIVE
Trich vag by NAA: NEGATIVE

## 2018-09-25 LAB — RPR: RPR Ser Ql: NONREACTIVE

## 2018-10-25 ENCOUNTER — Telehealth: Payer: Self-pay

## 2018-10-25 NOTE — Telephone Encounter (Signed)
Patient called and is requesting that her Wellbutrin be changed to 1 tablet once a day due to cost of the medication. Patient is also complaining of BV symptoms again that includes vaginal discharge and odor.  Patient states that she has not had any intercourse since last office visit.  Patient is requesting a refill on medication for the BV.  Patient is staying with her mother and states that at this time she is unable to come in for an office visit. LOV 09/21/2018. Please review and advise.  MPulliam, CMA/RT(R)

## 2018-10-26 NOTE — Telephone Encounter (Signed)
Needs OV.  

## 2018-10-29 NOTE — Telephone Encounter (Signed)
Pt expressed understanding and is agreeable.  T. Nakya Weyand, CMA 

## 2018-11-05 ENCOUNTER — Ambulatory Visit: Payer: Medicaid Other | Admitting: Family Medicine

## 2019-10-01 ENCOUNTER — Ambulatory Visit (HOSPITAL_COMMUNITY)
Admission: EM | Admit: 2019-10-01 | Discharge: 2019-10-01 | Disposition: A | Discharge status: Home / Self Care | Payer: Medicaid Other | Attending: Emergency Medicine | Admitting: Emergency Medicine

## 2019-10-01 ENCOUNTER — Ambulatory Visit (INDEPENDENT_AMBULATORY_CARE_PROVIDER_SITE_OTHER): Payer: Medicaid Other

## 2019-10-01 ENCOUNTER — Other Ambulatory Visit: Payer: Self-pay

## 2019-10-01 ENCOUNTER — Encounter (HOSPITAL_COMMUNITY): Payer: Self-pay

## 2019-10-01 DIAGNOSIS — S20212A Contusion of left front wall of thorax, initial encounter: Secondary | ICD-10-CM | POA: Diagnosis not present

## 2019-10-01 LAB — POCT URINALYSIS DIP (DEVICE)
Bilirubin Urine: NEGATIVE
Glucose, UA: NEGATIVE mg/dL
Ketones, ur: NEGATIVE mg/dL
Nitrite: NEGATIVE
Protein, ur: NEGATIVE mg/dL
Specific Gravity, Urine: >=1.03 (ref 1.005–1.030)
Urobilinogen, UA: 0.2 mg/dL (ref 0.0–1.0)
pH: 6 (ref 5.0–8.0)

## 2019-10-01 MED ORDER — IBUPROFEN 800 MG PO TABS: 800.0000 mg | ORAL_TABLET | Freq: Three times a day (TID) | ORAL | 0 refills | Status: DC | PRN

## 2019-10-01 NOTE — Discharge Instructions (Addendum)
The x-ray of your ribs is normal.  Take the ibuprofen as directed.    Follow-up with the orthopedist listed below or your orthopedist tomorrow.
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
## 2019-10-02 LAB — URINE CULTURE

## 2020-04-21 ENCOUNTER — Ambulatory Visit (HOSPITAL_COMMUNITY)
Admission: EM | Admit: 2020-04-21 | Discharge: 2020-04-21 | Disposition: A | Discharge status: Home / Self Care | Payer: Medicaid Other | Attending: Family Medicine | Admitting: Family Medicine

## 2020-04-21 ENCOUNTER — Other Ambulatory Visit: Payer: Self-pay

## 2020-04-21 ENCOUNTER — Encounter (HOSPITAL_COMMUNITY): Payer: Self-pay

## 2020-04-21 DIAGNOSIS — Z113 Encounter for screening for infections with a predominantly sexual mode of transmission: Secondary | ICD-10-CM

## 2020-04-21 DIAGNOSIS — N898 Other specified noninflammatory disorders of vagina: Secondary | ICD-10-CM

## 2020-04-21 MED ORDER — METRONIDAZOLE 500 MG PO TABS: ORAL_TABLET | ORAL | 0 refills | Status: DC

## 2020-04-21 MED ORDER — METRONIDAZOLE 500 MG PO TABS: 500.0000 mg | ORAL_TABLET | Freq: Two times a day (BID) | ORAL | 0 refills | Status: DC

## 2020-04-21 NOTE — ED Triage Notes (Signed)
Pt is wanting STD testing after having unprotected sex 4 days ago with a new partner. Pt is having vaginal odor, dicharge, itching for 4 days now.

## 2020-04-21 NOTE — ED Provider Notes (Signed)
[place]   [id number] 04/21/20 Arrival Time: 1308  ASSESSMENT & PLAN:  1. Vaginal discharge   2. Screening for STDs (sexually transmitted diseases)     Desires empiric treatment for BV/trich only.  Meds ordered this encounter  Medications  . metroNIDAZOLE (FLAGYL) 500 MG tablet    Sig: Take 1 tablet (500 mg total) by mouth 2 (two) times daily.    Dispense:  14 tablet    Refill:  0     Discharge Instructions     We have sent testing for sexually transmitted infections. We will notify you of any positive results once they are received. If required, we will prescribe any medications you might need.      Without s/s of PID.  Labs Reviewed  CERVICOVAGINAL ANCILLARY ONLY    Will notify of any positive results. Instructed to refrain from sexual activity for at least seven days.  Reviewed expectations re: course of current medical issues. Questions answered. Outlined signs and symptoms indicating need for more acute intervention. Patient verbalized understanding. After Visit Summary given.   SUBJECTIVE:  Erika Taylor is a 34 y.o. female who presents with complaint of vaginal discharge. H/O trich/BV with similar. Unprotected sex 4 d ago with new partner.  Describes discharge as thin and opaque; with odor. No specific aggravating or alleviating factors reported. Denies: urinary frequency, dysuria and gross hematuria. Afebrile. No abdominal or pelvic pain. Normal PO intake wihout n/v. No genital rashes or lesions. OTC treatment: none. H/O BV and trich with similar symptoms.  No LMP recorded. (Menstrual status: IUD).   OBJECTIVE:  Vitals:   04/21/20 1648 04/21/20 1650  BP:  129/67  Pulse:  87  Resp:  17  Temp:  98.7 F (37.1 C)  TempSrc:  Oral  SpO2:  99%  Weight: 79.4 kg      General appearance: alert, cooperative, appears stated age and no distress Lungs: unlabored respirations; speaks full sentences without difficulty Back: no CVA  tenderness; FROM at waist Abdomen: soft, non-tender GU: deferred Skin: warm and dry Psychological: alert and cooperative; normal mood and affect.   Labs Reviewed  CERVICOVAGINAL ANCILLARY ONLY   Allergies  Allergen Reactions  . Penicillins Rash    Past Medical History:  Diagnosis Date  . Depression    Family History  Adopted: Yes  Problem Relation Age of Onset  . Healthy Mother   . Healthy Father    Social History   Socioeconomic History  . Marital status: Single    Spouse name: Not on file  . Number of children: Not on file  . Years of education: Not on file  . Highest education level: Not on file  Occupational History  . Not on file  Tobacco Use  . Smoking status: Former Smoker    Packs/day: 1.00    Years: 12.00    Pack years: 12.00    Types: Cigarettes    Start date: 09/14/2015  . Smokeless tobacco: Never Used  Substance and Sexual Activity  . Alcohol use: Yes    Alcohol/week: 0.0 standard drinks    Comment: social  . Drug use: No  . Sexual activity: Yes    Birth control/protection: I.U.D., None  Other Topics Concern  . Not on file  Social History Narrative  . Not on file   Social Determinants of Health   Financial Resource Strain:   . Difficulty of Paying Living Expenses:   Food Insecurity:   . Worried About Charity fundraiser in the  Last Year:   . Ran Out of Food in the Last Year:   Transportation Needs:   . Freight forwarder (Medical):   Marland Kitchen Lack of Transportation (Non-Medical):   Physical Activity:   . Days of Exercise per Week:   . Minutes of Exercise per Session:   Stress:   . Feeling of Stress :   Social Connections:   . Frequency of Communication with Friends and Family:   . Frequency of Social Gatherings with Friends and Family:   . Attends Religious Services:   . Active Member of Clubs or Organizations:   . Attends Banker Meetings:   Marland Kitchen Marital Status:   Intimate Partner Violence:   . Fear of Current or  Ex-Partner:   . Emotionally Abused:   Marland Kitchen Physically Abused:   . Sexually Abused:           Mardella Layman, MD 04/22/20 1052

## 2020-04-21 NOTE — Discharge Instructions (Signed)
We have sent testing for sexually transmitted infections. We will notify you of any positive results once they are received. If required, we will prescribe any medications you might need. °

## 2020-04-24 ENCOUNTER — Telehealth (HOSPITAL_COMMUNITY): Payer: Self-pay | Admitting: Orthopedic Surgery

## 2020-04-24 NOTE — Telephone Encounter (Signed)
Attempted to call pt. New specimen needs to be recollected.

## 2020-09-07 ENCOUNTER — Ambulatory Visit
Admission: EM | Admit: 2020-09-07 | Discharge: 2020-09-07 | Disposition: A | Discharge status: Home / Self Care | Payer: Medicaid Other | Attending: Emergency Medicine | Admitting: Emergency Medicine

## 2020-09-07 ENCOUNTER — Other Ambulatory Visit: Payer: Self-pay

## 2020-09-07 ENCOUNTER — Ambulatory Visit (INDEPENDENT_AMBULATORY_CARE_PROVIDER_SITE_OTHER): Payer: Medicaid Other

## 2020-09-07 DIAGNOSIS — L03116 Cellulitis of left lower limb: Secondary | ICD-10-CM | POA: Diagnosis not present

## 2020-09-07 DIAGNOSIS — M79672 Pain in left foot: Secondary | ICD-10-CM | POA: Diagnosis not present

## 2020-09-07 DIAGNOSIS — Z113 Encounter for screening for infections with a predominantly sexual mode of transmission: Secondary | ICD-10-CM | POA: Insufficient documentation

## 2020-09-07 DIAGNOSIS — N898 Other specified noninflammatory disorders of vagina: Secondary | ICD-10-CM

## 2020-09-07 MED ORDER — AMOXICILLIN-POT CLAVULANATE 875-125 MG PO TABS: 1.0000 | ORAL_TABLET | Freq: Two times a day (BID) | ORAL | 0 refills | Status: AC

## 2020-09-07 MED ORDER — TETANUS-DIPHTH-ACELL PERTUSSIS 5-2.5-18.5 LF-MCG/0.5 IM SUSP
0.5000 mL | Freq: Once | INTRAMUSCULAR | Status: AC
  Administered 2020-09-07: 0.5 mL via INTRAMUSCULAR
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
## 2020-09-07 NOTE — Discharge Instructions (Addendum)

## 2020-09-07 NOTE — ED Triage Notes (Signed)
Pt has swelling and redness of her left foot and is complaining of pain as well. Pt is unsure of injury but states it may have been injured about 4 days ago. Pt is aox4 and ambulates with a limp.

## 2020-09-09 LAB — CERVICOVAGINAL ANCILLARY ONLY
Chlamydia: NEGATIVE
Comment: NEGATIVE
Comment: NEGATIVE
Comment: NORMAL
Neisseria Gonorrhea: NEGATIVE
Trichomonas: NEGATIVE

## 2020-11-16 ENCOUNTER — Other Ambulatory Visit (HOSPITAL_COMMUNITY)
Admission: RE | Admit: 2020-11-16 | Discharge: 2020-11-16 | Disposition: A | Discharge status: Home / Self Care | Payer: Medicaid Other | Source: Ambulatory Visit | Attending: Obstetrics & Gynecology | Admitting: Obstetrics & Gynecology

## 2020-11-16 ENCOUNTER — Other Ambulatory Visit: Payer: Self-pay

## 2020-11-16 ENCOUNTER — Ambulatory Visit (INDEPENDENT_AMBULATORY_CARE_PROVIDER_SITE_OTHER): Payer: Medicaid Other | Admitting: Obstetrics & Gynecology

## 2020-11-16 ENCOUNTER — Encounter (HOSPITAL_COMMUNITY): Payer: Self-pay | Admitting: *Deleted

## 2020-11-16 ENCOUNTER — Encounter: Payer: Self-pay | Admitting: Obstetrics & Gynecology

## 2020-11-16 ENCOUNTER — Ambulatory Visit (HOSPITAL_COMMUNITY)
Admission: EM | Admit: 2020-11-16 | Discharge: 2020-11-16 | Disposition: A | Discharge status: Home / Self Care | Payer: Medicaid Other | Attending: Internal Medicine | Admitting: Internal Medicine

## 2020-11-16 ENCOUNTER — Inpatient Hospital Stay (HOSPITAL_COMMUNITY)
Admission: AD | Admit: 2020-11-16 | Discharge: 2020-11-16 | Disposition: A | Discharge status: Home / Self Care | Payer: Medicaid Other | Attending: Family Medicine | Admitting: Family Medicine

## 2020-11-16 VITALS — BP 117/71 | HR 71 | Ht 69.0 in | Wt 173.4 lb

## 2020-11-16 DIAGNOSIS — R102 Pelvic and perineal pain: Secondary | ICD-10-CM | POA: Diagnosis not present

## 2020-11-16 DIAGNOSIS — Z975 Presence of (intrauterine) contraceptive device: Secondary | ICD-10-CM | POA: Diagnosis not present

## 2020-11-16 DIAGNOSIS — Z30013 Encounter for initial prescription of injectable contraceptive: Secondary | ICD-10-CM | POA: Diagnosis not present

## 2020-11-16 DIAGNOSIS — Z30432 Encounter for removal of intrauterine contraceptive device: Secondary | ICD-10-CM | POA: Diagnosis present

## 2020-11-16 DIAGNOSIS — R1030 Lower abdominal pain, unspecified: Secondary | ICD-10-CM | POA: Diagnosis present

## 2020-11-16 DIAGNOSIS — R109 Unspecified abdominal pain: Secondary | ICD-10-CM | POA: Diagnosis not present

## 2020-11-16 DIAGNOSIS — Z01419 Encounter for gynecological examination (general) (routine) without abnormal findings: Secondary | ICD-10-CM

## 2020-11-16 LAB — POCT URINALYSIS DIPSTICK, ED / UC
Bilirubin Urine: NEGATIVE
Glucose, UA: NEGATIVE mg/dL
Hgb urine dipstick: NEGATIVE
Ketones, ur: NEGATIVE mg/dL
Nitrite: NEGATIVE
Protein, ur: 30 mg/dL — AB
Specific Gravity, Urine: 1.02 (ref 1.005–1.030)
Urobilinogen, UA: 0.2 mg/dL (ref 0.0–1.0)
pH: 8.5 — ABNORMAL HIGH (ref 5.0–8.0)

## 2020-11-16 LAB — POC URINE PREG, ED: Preg Test, Ur: NEGATIVE

## 2020-11-16 MED ORDER — MEDROXYPROGESTERONE ACETATE 150 MG/ML IM SUSP
150.0000 mg | INTRAMUSCULAR | Status: DC
  Administered 2020-11-16: 150 mg via INTRAMUSCULAR

## 2020-11-16 NOTE — ED Triage Notes (Signed)
Pt reports ABD pain . Pt feels like she has a UTI . Pt thinks her IUD is out of place.

## 2020-11-16 NOTE — Discharge Instructions (Addendum)
Your pregnancy test is negative. The urine shows a few white cells which could be infection or coming from the vaginal area. I am sending it for a culture to see if any bacteria growns, and if it does an you need treatment we will let you know. Sign up for Mychart so you can see the results of the STD test.  Today go to the ER at [facility] to  get the IUD taken out since we dont do that here.  [address] [place],  [place]  [address] [phone]
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
## 2020-11-16 NOTE — Progress Notes (Signed)
Here for IUD removal- states had Mirena inserted 2013. Wants to have depo-provera. States having pelvic pain a few days and decided to have IUD removed since it is time and since having pain; thinks pain is related to IUD.  Vinette Crites,RN

## 2020-11-16 NOTE — Patient Instructions (Signed)
Medroxyprogesterone injection [Contraceptive] What is this medicine? MEDROXYPROGESTERONE (me DROX ee proe JES te rone) contraceptive injections prevent pregnancy. They provide effective birth control for 3 months. Depo-subQ Provera 104 is also used for treating pain related to endometriosis. This medicine may be used for other purposes; ask your health care provider or pharmacist if you have questions. COMMON BRAND NAME(S): Depo-Provera, Depo-subQ Provera 104 What should I tell my health care provider before I take this medicine? They need to know if you have any of these conditions:  frequently drink alcohol  asthma  blood vessel disease or a history of a blood clot in the lungs or legs  bone disease such as osteoporosis  breast cancer  diabetes  eating disorder (anorexia nervosa or bulimia)  high blood pressure  HIV infection or AIDS  kidney disease  liver disease  mental depression  migraine  seizures (convulsions)  stroke  tobacco smoker  vaginal bleeding  an unusual or allergic reaction to medroxyprogesterone, other hormones, medicines, foods, dyes, or preservatives  pregnant or trying to get pregnant  breast-feeding How should I use this medicine? Depo-Provera Contraceptive injection is given into a muscle. Depo-subQ Provera 104 injection is given under the skin. These injections are given by a health care professional. You must not be pregnant before getting an injection. The injection is usually given during the first 5 days after the start of a menstrual period or 6 weeks after delivery of a baby. Talk to your pediatrician regarding the use of this medicine in children. Special care may be needed. These injections have been used in female children who have started having menstrual periods. Overdosage: If you think you have taken too much of this medicine contact a poison control center or emergency room at once. NOTE: This medicine is only for you. Do not  share this medicine with others. What if I miss a dose? Try not to miss a dose. You must get an injection once every 3 months to maintain birth control. If you cannot keep an appointment, call and reschedule it. If you wait longer than 13 weeks between Depo-Provera contraceptive injections or longer than 14 weeks between Depo-subQ Provera 104 injections, you could get pregnant. Use another method for birth control if you miss your appointment. You may also need a pregnancy test before receiving another injection. What may interact with this medicine? Do not take this medicine with any of the following medications:  bosentan This medicine may also interact with the following medications:  aminoglutethimide  antibiotics or medicines for infections, especially rifampin, rifabutin, rifapentine, and griseofulvin  aprepitant  barbiturate medicines such as phenobarbital or primidone  bexarotene  carbamazepine  medicines for seizures like ethotoin, felbamate, oxcarbazepine, phenytoin, topiramate  modafinil  St. John's wort This list may not describe all possible interactions. Give your health care provider a list of all the medicines, herbs, non-prescription drugs, or dietary supplements you use. Also tell them if you smoke, drink alcohol, or use illegal drugs. Some items may interact with your medicine. What should I watch for while using this medicine? This drug does not protect you against HIV infection (AIDS) or other sexually transmitted diseases. Use of this product may cause you to lose calcium from your bones. Loss of calcium may cause weak bones (osteoporosis). Only use this product for more than 2 years if other forms of birth control are not right for you. The longer you use this product for birth control the more likely you will be at risk   for weak bones. Ask your health care professional how you can keep strong bones. You may have a change in bleeding pattern or irregular periods.  Many females stop having periods while taking this drug. If you have received your injections on time, your chance of being pregnant is very low. If you think you may be pregnant, see your health care professional as soon as possible. Tell your health care professional if you want to get pregnant within the next year. The effect of this medicine may last a long time after you get your last injection. What side effects may I notice from receiving this medicine? Side effects that you should report to your doctor or health care professional as soon as possible:  allergic reactions like skin rash, itching or hives, swelling of the face, lips, or tongue  breast tenderness or discharge  breathing problems  changes in vision  depression  feeling faint or lightheaded, falls  fever  pain in the abdomen, chest, groin, or leg  problems with balance, talking, walking  unusually weak or tired  yellowing of the eyes or skin Side effects that usually do not require medical attention (report to your doctor or health care professional if they continue or are bothersome):  acne  fluid retention and swelling  headache  irregular periods, spotting, or absent periods  temporary pain, itching, or skin reaction at site where injected  weight gain This list may not describe all possible side effects. Call your doctor for medical advice about side effects. You may report side effects to FDA at 1-800-FDA-1088. Where should I keep my medicine? This does not apply. The injection will be given to you by a health care professional. NOTE: This sheet is a summary. It may not cover all possible information. If you have questions about this medicine, talk to your doctor, pharmacist, or health care provider.  2020 Elsevier/Gold Standard (2008-12-12 18:37:56)  

## 2020-11-16 NOTE — Progress Notes (Signed)
Patient ID: Erika Taylor, female   DOB: March 11, 1986, 34 y.o.   MRN: [id number]  Chief Complaint  Patient presents with  . IUD insertion  . Gynecologic Exam  IU D removal with pelvic pain  HPI Erika Taylor is a 34 y.o. female.  [id number] IUD in place for 8 years with amenorrhea, She has had pelvic pain for 2-3 days. Urgent care saw her today and no STD test was sent. They referred her for gyn f/u but she could not be seen in MAU as she is not pregnant. She wants to be started on DMPA for [facility] with removal of the IUD. She has some dysuria and requests evaluation for UTI HPI  Past Medical History:  Diagnosis Date  . Depression     History reviewed. No pertinent surgical history.  Family History  Adopted: Yes  Problem Relation Age of Onset  . Healthy Mother   . Healthy Father     Social History Social History   Tobacco Use  . Smoking status: Former Smoker    Packs/day: 1.00    Years: 12.00    Pack years: 12.00    Types: Cigarettes    Start date: 09/14/2015  . Smokeless tobacco: Never Used  Vaping Use  . Vaping Use: Never used  Substance Use Topics  . Alcohol use: Yes    Alcohol/week: 0.0 standard drinks    Comment: social  . Drug use: No    Allergies  Allergen Reactions  . Penicillins Rash    Current Outpatient Medications  Medication Sig Dispense Refill  . ARIPiprazole (ABILIFY) 5 MG tablet Take by mouth.    Marland Kitchen buPROPion (WELLBUTRIN SR) 200 MG 12 hr tablet TAKE 1 TABLET BY MOUTH once A DAY    . zolpidem (AMBIEN) 10 MG tablet Take 1 tablet (10 mg total) by mouth at bedtime as needed for sleep. 30 tablet 3   Current Facility-Administered Medications  Medication Dose Route Frequency Provider Last Rate Last Admin  . medroxyPROGESTERone (DEPO-PROVERA) injection 150 mg  150 mg Intramuscular Q90 days Adam Phenix, MD        Review of Systems Review of Systems  Constitutional: Positive for fever.  Gastrointestinal: Negative.   Genitourinary: Positive for  dysuria and pelvic pain. Negative for vaginal bleeding and vaginal discharge.    Blood pressure 117/71, pulse 71, height 5\' 9"  (1.753 m), weight 173 lb 6.4 oz (78.7 kg).  Physical Exam Physical Exam Vitals and nursing note reviewed. Exam conducted with a chaperone present.  Constitutional:      Appearance: Normal appearance. She is not ill-appearing.  Pulmonary:     Effort: Pulmonary effort is normal.  Abdominal:     General: Abdomen is flat.  Genitourinary:    General: Normal vulva.     Vagina: Vaginal discharge present.     Cervix: Normal.     Uterus: Normal. Tender (mild CMT).      Adnexa: Right adnexa normal and left adnexa normal.     Comments: String seen at os and IUD removed intact Psychiatric:        Mood and Affect: Mood normal.        Behavior: Behavior normal.     Data Reviewed MAU triage and Urgent care notes  Assessment Well woman exam with routine gynecological exam - Plan: Cytology - PAP( [facility])  Pelvic pain in female  IUD (intrauterine device) in place  Initiation of Depo Provera - Plan: medroxyPROGESTERone (DEPO-PROVERA) injection 150 mg    Plan IUD  removed Start DMPA and RTC in 3 months. Pap and STD, UA testing ordered and will f/u result    Scheryl Darter 11/16/2020, 4:02 PM

## 2020-11-16 NOTE — MAU Provider Note (Signed)
Event Date/Time   First Provider Initiated Contact with Patient 11/16/20 1143      S Ms. Erika Taylor is a 34 y.o. No obstetric history on file. patient who presents to MAU today with complaint of wanting IUD removal and pelvic pain x2-3 days. Patient reports she had the IUD placed either 2013 or 2014 and had a Mirena IUD inserted. Patient reports she did not have her IUD taken out earlier because of life/insurance issues, but when she started having lower abdominal/pelvic pain, thought her IUD might be out of place.  O BP 133/79    Pulse 82    Temp 97.9 F (36.6 C)    Resp 18    SpO2 98%    Patient Vitals for the past 24 hrs:  BP Temp Pulse Resp SpO2  11/16/20 1136 133/79 97.9 F (36.6 C) 82 18 98 %   Physical Exam Vitals and nursing note reviewed.  Constitutional:      General: She is not in acute distress.    Appearance: Normal appearance. She is ill-appearing. She is not toxic-appearing or diaphoretic.  HENT:     Head: Normocephalic and atraumatic.  Pulmonary:     Effort: Pulmonary effort is normal.  Neurological:     Mental Status: She is alert and oriented to person, place, and time.  Psychiatric:        Mood and Affect: Mood normal.        Behavior: Behavior normal.        Thought Content: Thought content normal.        Judgment: Judgment normal.    A Medical screening exam complete  P Discharge from MAU in stable condition Patient scheduled for appt at 255PM at [facility], pt given address and appointment time and instructed to arrive on time or early Warning signs for worsening condition that would warrant emergency follow-up discussed Patient may return to MAU as needed   Lekisha Mcghee, Odie Sera, NP 11/16/2020 12:37 PM

## 2020-11-16 NOTE — MAU Note (Signed)
Pt presents to MAU with complaints of lower abdominal cramping for days. Went to urgent care and was told to come here that she may have an infection from her IUD. PT states it was suppose to be taken out 2 years ago

## 2020-11-18 LAB — CERVICOVAGINAL ANCILLARY ONLY
Bacterial Vaginitis (gardnerella): POSITIVE — AB
Candida Glabrata: NEGATIVE
Candida Vaginitis: NEGATIVE
Chlamydia: NEGATIVE
Comment: NEGATIVE
Comment: NEGATIVE
Comment: NEGATIVE
Comment: NEGATIVE
Comment: NEGATIVE
Comment: NORMAL
Neisseria Gonorrhea: NEGATIVE
Trichomonas: NEGATIVE

## 2020-11-18 LAB — URINE CULTURE: Culture: 80000 — AB

## 2020-11-19 ENCOUNTER — Telehealth (HOSPITAL_COMMUNITY): Payer: Self-pay | Admitting: Emergency Medicine

## 2020-11-19 LAB — URINE CULTURE

## 2020-11-19 MED ORDER — METRONIDAZOLE 500 MG PO TABS: 500.0000 mg | ORAL_TABLET | Freq: Two times a day (BID) | ORAL | 0 refills | Status: DC

## 2020-11-19 MED ORDER — FOSFOMYCIN TROMETHAMINE 3 G PO PACK: 3.0000 g | PACK | Freq: Once | ORAL | 0 refills | Status: AC

## 2020-11-20 ENCOUNTER — Other Ambulatory Visit: Payer: Self-pay | Admitting: Obstetrics & Gynecology

## 2020-11-20 DIAGNOSIS — N3000 Acute cystitis without hematuria: Secondary | ICD-10-CM

## 2020-11-20 LAB — CYTOLOGY - PAP
Comment: NEGATIVE
Diagnosis: HIGH — AB
High risk HPV: POSITIVE — AB

## 2020-11-20 MED ORDER — SULFAMETHOXAZOLE-TRIMETHOPRIM 800-160 MG PO TABS: 1.0000 | ORAL_TABLET | Freq: Two times a day (BID) | ORAL | 0 refills | Status: DC

## 2020-11-20 NOTE — Progress Notes (Signed)
Meds ordered this encounter  Medications  . sulfamethoxazole-trimethoprim (BACTRIM DS) 800-160 MG tablet    Sig: Take 1 tablet by mouth 2 (two) times daily.    Dispense:  6 tablet    Refill:  0    

## 2020-11-20 NOTE — Progress Notes (Signed)
I sent a prescription for Bactrim for UTI

## 2020-11-24 ENCOUNTER — Telehealth: Payer: Self-pay

## 2020-11-24 NOTE — Telephone Encounter (Signed)
Called Pt to advise of testing + for UTI & that Rx Bactrim was sent to her pharmacy. No answer & no VM Box has been set up. Will try again later.

## 2020-11-24 NOTE — Telephone Encounter (Signed)
-----   Message from Adam Phenix, MD sent at 11/20/2020  7:08 PM EST ----- I sent a prescription for Bactrim for UTI

## 2020-11-25 ENCOUNTER — Telehealth: Payer: Self-pay

## 2020-11-25 NOTE — Telephone Encounter (Signed)
Called Pt again today to go over test results, explained to Pt that she tested positive for UTI & that Bactrim was sent to her pharmacy. Pt verbalized understanding.

## 2021-01-08 ENCOUNTER — Emergency Department (HOSPITAL_COMMUNITY)
Admission: EM | Admit: 2021-01-08 | Discharge: 2021-01-08 | Disposition: A | Discharge status: Home / Self Care | Payer: BC Managed Care – PPO | Attending: Emergency Medicine | Admitting: Emergency Medicine

## 2021-01-08 ENCOUNTER — Emergency Department (HOSPITAL_COMMUNITY): Payer: BC Managed Care – PPO

## 2021-01-08 ENCOUNTER — Other Ambulatory Visit: Payer: Self-pay

## 2021-01-08 ENCOUNTER — Encounter (HOSPITAL_COMMUNITY): Payer: Self-pay | Admitting: *Deleted

## 2021-01-08 DIAGNOSIS — R079 Chest pain, unspecified: Secondary | ICD-10-CM | POA: Insufficient documentation

## 2021-01-08 DIAGNOSIS — T40601A Poisoning by unspecified narcotics, accidental (unintentional), initial encounter: Secondary | ICD-10-CM

## 2021-01-08 DIAGNOSIS — T402X1A Poisoning by other opioids, accidental (unintentional), initial encounter: Secondary | ICD-10-CM | POA: Insufficient documentation

## 2021-01-08 DIAGNOSIS — R110 Nausea: Secondary | ICD-10-CM | POA: Insufficient documentation

## 2021-01-08 DIAGNOSIS — Z87891 Personal history of nicotine dependence: Secondary | ICD-10-CM | POA: Insufficient documentation

## 2021-01-08 MED ORDER — METOCLOPRAMIDE HCL 5 MG/ML IJ SOLN
10.0000 mg | INTRAMUSCULAR | Status: AC
  Administered 2021-01-08: 10 mg via INTRAMUSCULAR
  Filled 2021-01-08: qty 2

## 2021-01-08 NOTE — ED Notes (Signed)
Ladene Artist, friend, [phone] would like to speak with pt

## 2021-01-08 NOTE — ED Triage Notes (Signed)
Pt arrives via [facility] from scene. Pt overdosed on heroin, unresponsive. Chest compressions intiated by bystaders at the scene and then continued by PD, resp assisted with BVM. [facility] gave 2 Narcan IM with improved respiratory and neuro status.  12 lead unremarkable. cbg 287. rr 22, 97% RA. Pt is alert, talking on her phone on arrival to ED by EMS. Admits to heroin use.
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
## 2021-01-08 NOTE — Discharge Instructions (Signed)
Discontinue use of illicit drugs. Follow up with your primary care doctor.

## 2021-01-08 NOTE — ED Notes (Signed)
Pt ambulated in hallway independently, given drink, called a ride.

## 2021-01-10 ENCOUNTER — Encounter (HOSPITAL_COMMUNITY): Payer: Self-pay | Admitting: Emergency Medicine

## 2021-01-10 ENCOUNTER — Other Ambulatory Visit: Payer: Self-pay

## 2021-01-10 ENCOUNTER — Ambulatory Visit (HOSPITAL_COMMUNITY)
Admission: EM | Admit: 2021-01-10 | Discharge: 2021-01-10 | Disposition: A | Discharge status: Home / Self Care | Payer: Medicaid Other | Attending: Urgent Care | Admitting: Urgent Care

## 2021-01-10 DIAGNOSIS — R0789 Other chest pain: Secondary | ICD-10-CM

## 2021-01-10 MED ORDER — TIZANIDINE HCL 4 MG PO TABS: 4.0000 mg | ORAL_TABLET | Freq: Three times a day (TID) | ORAL | 0 refills | Status: DC | PRN

## 2021-01-10 MED ORDER — NAPROXEN 500 MG PO TABS: 500.0000 mg | ORAL_TABLET | Freq: Two times a day (BID) | ORAL | 0 refills | Status: DC
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
## 2021-01-10 NOTE — ED Provider Notes (Signed)
[facility]   MRN: [id number] DOB: 1986-01-28  Subjective:   Erika Taylor is a 35 y.o. female presenting for ongoing mid sternal chest pain that elicits shob. Patient was given CPR for an opioid overdose on 01/08/2021. She was subsequently seen in the ER and had a chest x-ray done that was negative. Has not taken any medications since then. Denies any new falls, trauma, coughing, fever.    Current Facility-Administered Medications:  .  medroxyPROGESTERone (DEPO-PROVERA) injection 150 mg, 150 mg, Intramuscular, Q90 days, Adam Phenix, MD, 150 mg at 11/16/20 1617  Current Outpatient Medications:  .  ARIPiprazole (ABILIFY) 5 MG tablet, Take by mouth., Disp: , Rfl:  .  buPROPion (WELLBUTRIN SR) 200 MG 12 hr tablet, TAKE 1 TABLET BY MOUTH once A DAY, Disp: , Rfl:  .  metroNIDAZOLE (FLAGYL) 500 MG tablet, Take 1 tablet (500 mg total) by mouth 2 (two) times daily., Disp: 14 tablet, Rfl: 0 .  sulfamethoxazole-trimethoprim (BACTRIM DS) 800-160 MG tablet, Take 1 tablet by mouth 2 (two) times daily., Disp: 6 tablet, Rfl: 0 .  zolpidem (AMBIEN) 10 MG tablet, Take 1 tablet (10 mg total) by mouth at bedtime as needed for sleep., Disp: 30 tablet, Rfl: 3   Allergies  Allergen Reactions  . Penicillins Rash    Past Medical History:  Diagnosis Date  . Depression      No past surgical history on file.  Family History  Adopted: Yes  Problem Relation Age of Onset  . Healthy Mother   . Healthy Father     Social History   Tobacco Use  . Smoking status: Former Smoker    Packs/day: 1.00    Years: 12.00    Pack years: 12.00    Types: Cigarettes    Start date: 09/14/2015  . Smokeless tobacco: Never Used  Vaping Use  . Vaping Use: Never used  Substance Use Topics  . Alcohol use: Yes    Alcohol/week: 0.0 standard drinks    Comment: social  . Drug use: Yes    Comment: heroin    ROS   Objective:   Vitals: BP 126/72 (BP Location: Right Arm)   Pulse 76    Temp (!) 97.5 F (36.4 C)   Resp 17   SpO2 99%   Physical Exam Constitutional:      General: She is not in acute distress.    Appearance: Normal appearance. She is well-developed. She is not ill-appearing, toxic-appearing or diaphoretic.  HENT:     Head: Normocephalic and atraumatic.     Nose: Nose normal.     Mouth/Throat:     Mouth: Mucous membranes are moist.  Eyes:     Extraocular Movements: Extraocular movements intact.     Pupils: Pupils are equal, round, and reactive to light.  Cardiovascular:     Rate and Rhythm: Normal rate and regular rhythm.     Pulses: Normal pulses.     Heart sounds: Normal heart sounds. No murmur heard. No friction rub. No gallop.   Pulmonary:     Effort: Pulmonary effort is normal. No respiratory distress.     Breath sounds: Normal breath sounds. No stridor. No wheezing, rhonchi or rales.  Chest:     Chest wall: Tenderness (mild over mid sternum) present.  Skin:    General: Skin is warm and dry.     Findings: No rash.  Neurological:     Mental Status: She is alert and oriented to person, place, and  time.  Psychiatric:        Mood and Affect: Mood normal.        Behavior: Behavior normal.        Thought Content: Thought content normal.     DG Chest 1 View  Result Date: 01/08/2021 CLINICAL DATA:  Chest pain.  Heroin overdose. EXAM: CHEST  1 VIEW COMPARISON:  None. FINDINGS: The heart size and mediastinal contours are within normal limits. Both lungs are clear. The visualized skeletal structures are unremarkable. IMPRESSION: No active disease. Electronically Signed   By: Deatra Robinson M.D.   On: 01/08/2021 02:08   Assessment and Plan :   PDMP not reviewed this encounter.  1. Mid sternal chest pain     Clear cardiopulmonary exam, will defer imaging as the chest x-ray on 01/08/2021 was negative and patient has no active shob, coughing, clear lung exam. She has also not taken any medications. Start naproxen, tizanidine. Recommended supportive  care otherwise. Counseled patient on potential for adverse effects with medications prescribed/recommended today, ER and return-to-clinic precautions discussed, patient verbalized understanding.    Wallis Bamberg, [place] 01/10/21 9357

## 2021-01-11 ENCOUNTER — Emergency Department (HOSPITAL_COMMUNITY)
Admission: EM | Admit: 2021-01-11 | Discharge: 2021-01-12 | Disposition: A | Discharge status: Home / Self Care | Payer: BC Managed Care – PPO | Source: Home / Self Care | Attending: Emergency Medicine | Admitting: Emergency Medicine

## 2021-01-11 ENCOUNTER — Emergency Department (HOSPITAL_COMMUNITY): Payer: BC Managed Care – PPO

## 2021-01-11 DIAGNOSIS — G40509 Epileptic seizures related to external causes, not intractable, without status epilepticus: Secondary | ICD-10-CM | POA: Insufficient documentation

## 2021-01-11 DIAGNOSIS — T43292A Poisoning by other antidepressants, intentional self-harm, initial encounter: Secondary | ICD-10-CM | POA: Insufficient documentation

## 2021-01-11 DIAGNOSIS — F332 Major depressive disorder, recurrent severe without psychotic features: Secondary | ICD-10-CM | POA: Insufficient documentation

## 2021-01-11 DIAGNOSIS — T50902A Poisoning by unspecified drugs, medicaments and biological substances, intentional self-harm, initial encounter: Secondary | ICD-10-CM

## 2021-01-11 DIAGNOSIS — T50905A Adverse effect of unspecified drugs, medicaments and biological substances, initial encounter: Secondary | ICD-10-CM

## 2021-01-11 DIAGNOSIS — Z20822 Contact with and (suspected) exposure to covid-19: Secondary | ICD-10-CM | POA: Insufficient documentation

## 2021-01-11 DIAGNOSIS — Z79899 Other long term (current) drug therapy: Secondary | ICD-10-CM | POA: Insufficient documentation

## 2021-01-11 DIAGNOSIS — X58XXXA Exposure to other specified factors, initial encounter: Secondary | ICD-10-CM | POA: Insufficient documentation

## 2021-01-11 DIAGNOSIS — Z87891 Personal history of nicotine dependence: Secondary | ICD-10-CM | POA: Insufficient documentation

## 2021-01-11 DIAGNOSIS — R45851 Suicidal ideations: Secondary | ICD-10-CM

## 2021-01-11 DIAGNOSIS — R569 Unspecified convulsions: Secondary | ICD-10-CM

## 2021-01-11 LAB — COMPREHENSIVE METABOLIC PANEL
ALT: 12 U/L (ref 0–44)
AST: 21 U/L (ref 15–41)
Albumin: 3.9 g/dL (ref 3.5–5.0)
Alkaline Phosphatase: 45 U/L (ref 38–126)
Anion gap: 31 — ABNORMAL HIGH (ref 5–15)
BUN: 6 mg/dL (ref 6–20)
CO2: 8 mmol/L — ABNORMAL LOW (ref 22–32)
Calcium: 9.6 mg/dL (ref 8.9–10.3)
Chloride: 107 mmol/L (ref 98–111)
Creatinine, Ser: 1.23 mg/dL — ABNORMAL HIGH (ref 0.44–1.00)
GFR, Estimated: 59 mL/min — ABNORMAL LOW (ref >60)
Glucose, Bld: 130 mg/dL — ABNORMAL HIGH (ref 70–99)
Potassium: 3 mmol/L — ABNORMAL LOW (ref 3.5–5.1)
Sodium: 146 mmol/L — ABNORMAL HIGH (ref 135–145)
Total Bilirubin: 0.5 mg/dL (ref 0.3–1.2)
Total Protein: 7.6 g/dL (ref 6.5–8.1)

## 2021-01-11 LAB — CBC WITH DIFFERENTIAL/PLATELET
Abs Immature Granulocytes: 0 10*3/uL (ref 0.00–0.07)
Basophils Absolute: 0 10*3/uL (ref 0.0–0.1)
Basophils Relative: 0 %
Eosinophils Absolute: 0.2 10*3/uL (ref 0.0–0.5)
Eosinophils Relative: 1 %
HCT: 46 % (ref 36.0–46.0)
Hemoglobin: 13.3 g/dL (ref 12.0–15.0)
Lymphocytes Relative: 27 %
Lymphs Abs: 4.4 10*3/uL — ABNORMAL HIGH (ref 0.7–4.0)
MCH: 31 pg (ref 26.0–34.0)
MCHC: 28.9 g/dL — ABNORMAL LOW (ref 30.0–36.0)
MCV: 107.2 fL — ABNORMAL HIGH (ref 80.0–100.0)
Monocytes Absolute: 1.3 10*3/uL — ABNORMAL HIGH (ref 0.1–1.0)
Monocytes Relative: 8 %
Neutro Abs: 10.4 10*3/uL — ABNORMAL HIGH (ref 1.7–7.7)
Neutrophils Relative %: 64 %
Platelets: 379 10*3/uL (ref 150–400)
RBC: 4.29 MIL/uL (ref 3.87–5.11)
RDW: 12.9 % (ref 11.5–15.5)
WBC: 16.3 10*3/uL — ABNORMAL HIGH (ref 4.0–10.5)
nRBC: 0 % (ref 0.0–0.2)
nRBC: 0 /100 WBC

## 2021-01-11 LAB — MAGNESIUM: Magnesium: 2.6 mg/dL — ABNORMAL HIGH (ref 1.7–2.4)

## 2021-01-11 LAB — URINALYSIS, ROUTINE W REFLEX MICROSCOPIC
Bilirubin Urine: NEGATIVE
Glucose, UA: NEGATIVE mg/dL
Hgb urine dipstick: NEGATIVE
Ketones, ur: NEGATIVE mg/dL
Nitrite: POSITIVE — AB
Protein, ur: 30 mg/dL — AB
Specific Gravity, Urine: 1.02 (ref 1.005–1.030)
WBC, UA: >50 WBC/hpf — ABNORMAL HIGH (ref 0–5)
pH: 5 (ref 5.0–8.0)

## 2021-01-11 LAB — RAPID URINE DRUG SCREEN, HOSP PERFORMED
Amphetamines: POSITIVE — AB
Barbiturates: NOT DETECTED
Benzodiazepines: POSITIVE — AB
Cocaine: POSITIVE — AB
Opiates: NOT DETECTED
Tetrahydrocannabinol: POSITIVE — AB

## 2021-01-11 LAB — BASIC METABOLIC PANEL
Anion gap: 11 (ref 5–15)
BUN: 8 mg/dL (ref 6–20)
CO2: 20 mmol/L — ABNORMAL LOW (ref 22–32)
Calcium: 8.3 mg/dL — ABNORMAL LOW (ref 8.9–10.3)
Chloride: 111 mmol/L (ref 98–111)
Creatinine, Ser: 1.02 mg/dL — ABNORMAL HIGH (ref 0.44–1.00)
GFR, Estimated: >60 mL/min (ref >60)
Glucose, Bld: 95 mg/dL (ref 70–99)
Potassium: 4 mmol/L (ref 3.5–5.1)
Sodium: 142 mmol/L (ref 135–145)

## 2021-01-11 LAB — I-STAT BETA HCG BLOOD, ED (MC, WL, AP ONLY): I-stat hCG, quantitative: <5 m[IU]/mL (ref <5)

## 2021-01-11 LAB — ETHANOL: Alcohol, Ethyl (B): 24 mg/dL — ABNORMAL HIGH (ref <10)

## 2021-01-11 LAB — SALICYLATE LEVEL: Salicylate Lvl: <7 mg/dL — ABNORMAL LOW (ref 7.0–30.0)

## 2021-01-11 LAB — ACETAMINOPHEN LEVEL: Acetaminophen (Tylenol), Serum: <10 ug/mL — ABNORMAL LOW (ref 10–30)

## 2021-01-11 MED ORDER — POTASSIUM CHLORIDE CRYS ER 20 MEQ PO TBCR
40.0000 meq | EXTENDED_RELEASE_TABLET | ORAL | Status: AC
  Administered 2021-01-11: 40 meq via ORAL
  Filled 2021-01-11 (×2): qty 2

## 2021-01-11 MED ORDER — SODIUM CHLORIDE 0.9 % IV SOLN
2.0000 g | Freq: Once | INTRAVENOUS | Status: AC
  Administered 2021-01-11: 2 g via INTRAVENOUS
  Filled 2021-01-11: qty 20

## 2021-01-11 MED ORDER — POTASSIUM CHLORIDE 10 MEQ/100ML IV SOLN
10.0000 meq | INTRAVENOUS | Status: AC
  Administered 2021-01-11 (×3): 10 meq via INTRAVENOUS
  Filled 2021-01-11 (×3): qty 100

## 2021-01-11 MED ORDER — LORAZEPAM 2 MG/ML IJ SOLN
INTRAMUSCULAR | Status: AC
  Filled 2021-01-11: qty 1

## 2021-01-11 MED ORDER — LACTATED RINGERS IV BOLUS
1000.0000 mL | Freq: Once | INTRAVENOUS | Status: AC
  Administered 2021-01-11: 1000 mL via INTRAVENOUS

## 2021-01-11 MED ORDER — SODIUM CHLORIDE 0.9 % IV BOLUS
1000.0000 mL | Freq: Once | INTRAVENOUS | Status: AC
  Administered 2021-01-11: 1000 mL via INTRAVENOUS

## 2021-01-11 MED ORDER — LORAZEPAM 2 MG/ML IJ SOLN
0.5000 mg | Freq: Once | INTRAMUSCULAR | Status: AC
  Administered 2021-01-11: 0.5 mg via INTRAVENOUS
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
## 2021-01-11 NOTE — ED Notes (Addendum)
Pt had seizure like activity . MD Preston Fleeting at bedside. Verbal order of 1 mg of ativan ordered.

## 2021-01-11 NOTE — ED Provider Notes (Signed)
35 yo F with a cc of intentional OD.  Poison control Recommended 12 hour obs.  Patient alert, oriented tolerating PO.  Medically clear.  TTS eval.    Melene Plan, DO 01/11/21 1406

## 2021-01-11 NOTE — ED Notes (Signed)
Pt is being TSS now

## 2021-01-11 NOTE — ED Notes (Signed)
Poison control recs. Serial EKGs q 4-6 hrs to monitor QRS and QTC, d/t delayed effects of wellbutrin

## 2021-01-11 NOTE — ED Triage Notes (Signed)
Pt arrives to ED BIB pt's boyfriend due to a drug overdose. Per pt's boyfriend pt ingested an entire bottle of prescribed Bupropion 200mg  and became unresponsive. Pt is only responds to painful stimuli and is currently on 15L  NRB. EDP at bedside upon pt's arrival.

## 2021-01-12 ENCOUNTER — Encounter (HOSPITAL_COMMUNITY): Payer: Self-pay | Admitting: Urology

## 2021-01-12 ENCOUNTER — Inpatient Hospital Stay (HOSPITAL_COMMUNITY)
Admission: AD | Admit: 2021-01-12 | Discharge: 2021-01-15 | DRG: 885 | Disposition: A | Discharge status: Home / Self Care | Payer: BC Managed Care – PPO | Source: Intra-hospital | Attending: Psychiatry | Admitting: Psychiatry

## 2021-01-12 ENCOUNTER — Other Ambulatory Visit: Payer: Self-pay

## 2021-01-12 ENCOUNTER — Telehealth: Payer: Self-pay | Admitting: Physician Assistant

## 2021-01-12 DIAGNOSIS — F1510 Other stimulant abuse, uncomplicated: Secondary | ICD-10-CM | POA: Diagnosis present

## 2021-01-12 DIAGNOSIS — Z91410 Personal history of adult physical and sexual abuse: Secondary | ICD-10-CM

## 2021-01-12 DIAGNOSIS — Z9151 Personal history of suicidal behavior: Secondary | ICD-10-CM | POA: Diagnosis not present

## 2021-01-12 DIAGNOSIS — F419 Anxiety disorder, unspecified: Secondary | ICD-10-CM | POA: Diagnosis present

## 2021-01-12 DIAGNOSIS — T43202A Poisoning by unspecified antidepressants, intentional self-harm, initial encounter: Secondary | ICD-10-CM | POA: Diagnosis not present

## 2021-01-12 DIAGNOSIS — T43201A Poisoning by unspecified antidepressants, accidental (unintentional), initial encounter: Secondary | ICD-10-CM

## 2021-01-12 DIAGNOSIS — Z880 Allergy status to penicillin: Secondary | ICD-10-CM

## 2021-01-12 DIAGNOSIS — Z20822 Contact with and (suspected) exposure to covid-19: Secondary | ICD-10-CM | POA: Diagnosis present

## 2021-01-12 DIAGNOSIS — F332 Major depressive disorder, recurrent severe without psychotic features: Secondary | ICD-10-CM | POA: Diagnosis present

## 2021-01-12 DIAGNOSIS — F1910 Other psychoactive substance abuse, uncomplicated: Secondary | ICD-10-CM | POA: Diagnosis present

## 2021-01-12 DIAGNOSIS — Z87891 Personal history of nicotine dependence: Secondary | ICD-10-CM | POA: Diagnosis not present

## 2021-01-12 DIAGNOSIS — D539 Nutritional anemia, unspecified: Secondary | ICD-10-CM | POA: Diagnosis present

## 2021-01-12 DIAGNOSIS — Z79899 Other long term (current) drug therapy: Secondary | ICD-10-CM | POA: Diagnosis not present

## 2021-01-12 DIAGNOSIS — F1410 Cocaine abuse, uncomplicated: Secondary | ICD-10-CM | POA: Diagnosis present

## 2021-01-12 DIAGNOSIS — F1110 Opioid abuse, uncomplicated: Secondary | ICD-10-CM | POA: Diagnosis present

## 2021-01-12 LAB — SARS CORONAVIRUS 2 BY RT PCR (HOSPITAL ORDER, PERFORMED IN ~~LOC~~ HOSPITAL LAB): SARS Coronavirus 2: NEGATIVE

## 2021-01-12 MED ORDER — SERTRALINE HCL 50 MG PO TABS
50.0000 mg | ORAL_TABLET | Freq: Every day | ORAL | Status: DC
Start: 2021-01-13 — End: 2021-01-13
  Administered 2021-01-13: 50 mg via ORAL
  Filled 2021-01-12 (×2): qty 1

## 2021-01-12 MED ORDER — ALUM & MAG HYDROXIDE-SIMETH 200-200-20 MG/5ML PO SUSP: 30.0000 mL | ORAL | Status: DC | PRN

## 2021-01-12 MED ORDER — HYDROXYZINE HCL 25 MG PO TABS
25.0000 mg | ORAL_TABLET | Freq: Four times a day (QID) | ORAL | Status: DC | PRN
  Administered 2021-01-12 – 2021-01-14 (×3): 25 mg via ORAL
  Filled 2021-01-12 (×3): qty 1

## 2021-01-12 MED ORDER — ADULT MULTIVITAMIN W/MINERALS CH
1.0000 | ORAL_TABLET | Freq: Every day | ORAL | Status: DC
  Administered 2021-01-12 – 2021-01-15 (×4): 1 via ORAL
  Filled 2021-01-12 (×7): qty 1

## 2021-01-12 MED ORDER — LORAZEPAM 1 MG PO TABS: 1.0000 mg | ORAL_TABLET | Freq: Four times a day (QID) | ORAL | Status: DC | PRN

## 2021-01-12 MED ORDER — HYDROXYZINE HCL 25 MG PO TABS: 25.0000 mg | ORAL_TABLET | Freq: Three times a day (TID) | ORAL | Status: DC | PRN

## 2021-01-12 MED ORDER — MAGNESIUM HYDROXIDE 400 MG/5ML PO SUSP: 30.0000 mL | Freq: Every day | ORAL | Status: DC | PRN

## 2021-01-12 MED ORDER — ARIPIPRAZOLE 5 MG PO TABS
5.0000 mg | ORAL_TABLET | Freq: Every day | ORAL | Status: DC
  Administered 2021-01-13 – 2021-01-15 (×3): 5 mg via ORAL
  Filled 2021-01-12 (×5): qty 1

## 2021-01-12 MED ORDER — FOLIC ACID 1 MG PO TABS
1.0000 mg | ORAL_TABLET | Freq: Every day | ORAL | Status: DC
  Administered 2021-01-12 – 2021-01-15 (×4): 1 mg via ORAL
  Filled 2021-01-12 (×7): qty 1

## 2021-01-12 MED ORDER — THIAMINE HCL 100 MG PO TABS
100.0000 mg | ORAL_TABLET | Freq: Every day | ORAL | Status: DC
  Administered 2021-01-13 – 2021-01-15 (×3): 100 mg via ORAL
  Filled 2021-01-12 (×5): qty 1

## 2021-01-12 MED ORDER — ACETAMINOPHEN 325 MG PO TABS: 650.0000 mg | ORAL_TABLET | Freq: Four times a day (QID) | ORAL | Status: DC | PRN

## 2021-01-12 MED ORDER — ONDANSETRON 4 MG PO TBDP: 4.0000 mg | ORAL_TABLET | Freq: Four times a day (QID) | ORAL | Status: DC | PRN

## 2021-01-12 MED ORDER — TRAZODONE HCL 50 MG PO TABS
50.0000 mg | ORAL_TABLET | Freq: Every evening | ORAL | Status: DC | PRN
  Administered 2021-01-12 – 2021-01-14 (×3): 50 mg via ORAL
  Filled 2021-01-12 (×3): qty 1

## 2021-01-12 MED ORDER — LOPERAMIDE HCL 2 MG PO CAPS: 2.0000 mg | ORAL_CAPSULE | ORAL | Status: DC | PRN

## 2021-01-12 NOTE — ED Notes (Signed)
Pt belongings in locker 3.  

## 2021-01-12 NOTE — Progress Notes (Signed)
Psychoeducational Group Note  Date:  01/12/2021 Time:  2207  Group Topic/Focus:  Wrap-Up Group:   The focus of this group is to help patients review their daily goal of treatment and discuss progress on daily workbooks.  Participation Level: Did Not Attend  Participation Quality:  Not Applicable  Affect:  Not Applicable  Cognitive:  Not Applicable  Insight:  Not Applicable  Engagement in Group: Not Applicable  Additional Comments:  The patient did not attend group this evening.   Hazle Coca S 01/12/2021, 10:07 PM

## 2021-01-12 NOTE — Progress Notes (Signed)
   01/12/21 1103  Vital Signs  Temp 98 F (36.7 C)  Temp Source Oral  Pulse Rate 74  Pulse Rate Source Dinamap  Resp 18  BP (!) 116/53  BP Location Left Arm  BP Method Automatic  Patient Position (if appropriate) Sitting  Oxygen Therapy  SpO2 98 %  Pain Assessment  Pain Scale 0-10  Pain Score 0  Complaints & Interventions  Complains of Agitation;Anxiety;Restless  Interventions Other (comment) (admission)  Height and Weight  Height 5\' 9"  (1.753 m)  Weight 78.7 kg  Type of Weight Stated  BSA (Calculated - sq m) 1.96 sq meters  BMI (Calculated) 25.61  Weight in (lb) to have BMI = 25 168.9   Initial nursing Assessment D: Patient is voluntary brought over from [facility] after a second OD SUA. Patient OD on Wellbutrin and was found unresponsive. First SUA was an OD on Feb. 4, 2022. Patient has been dx with MDD  and poly-substance abuse. Pt. admitted to a hx of cutting. Pt reported that for this last attempt the trigger was that she was "feeling depressed and didn't want to be around" Pt. Reported that she started feeling depressed around the time she had her baby (Pt.'s son is 33 years old.)Pt. denies AVH and SI/HI at this time. Pt. Contracts for safety. A:   Support and encouragement provided Routine safety checks conducted every 15 minutes. Patient  Informed to notify staff with any concerns.  R:  Safety maintained.

## 2021-01-12 NOTE — BH Assessment (Addendum)
Comprehensive Clinical Assessment (CCA) Screening, Triage and Referral Note  01/12/2021 Waynette Towers [id number] -Clinician reviewed note by Dr. Preston Fleeting.  Around 04:58 on 02/07 he wrote: Patient now awake and able to answer questions. She admits to taking a whole bottle of Wellbutrin as well as a bottle of Lyrica and admits that there was a suicidal intent. She continues to be hemodynamically stable and is now maintaining adequate oxygen saturation on room air. She will be given oral potassium and will recheck electrolytes.  Clinician saw patient at 00:57 on 02/08 and patient was sleepy.  She only answered a few questions.  When asked if she intended to kill herself with the overdose on Wellbutrin and Lyrica she says "yes."  Clinician asked if she wanted to kill herself when she overdosed on heroin on 02/04 and she says "yeah."   Patient fell back asleep and the rest of the assessment could not be completed.  Clinician was able to at least get information to NP Cecilio Asper who said that patient does meet inpatient care criteria.  Clinician IM'ed Dr. Adela Lank and nurse Jarold Motto of disposition.  Chief Complaint:  Chief Complaint  Patient presents with  . Drug Overdose   Visit Diagnosis: MDD recurrent, severe  Patient Reported Information How did you hear about [place]? Family/Friend   Referral name: Sharia Reeve, her boyfriend brought her to [facility].   Referral phone number: No data recorded Whom do you see for routine medical problems? I don't have a doctor   Practice/Facility Name: No data recorded  Practice/Facility Phone Number: No data recorded  Name of Contact: No data recorded  Contact Number: No data recorded  Contact Fax Number: No data recorded  Prescriber Name: No data recorded  Prescriber Address (if known): No data recorded What Is the Reason for Your Visit/Call Today? Pt intentionally overdosed on Wellbutrin and Lyrica on 02/07 in an effort to kill herself.  How Long Has This Been Causing  You Problems? 1 wk - 1 month  Have You Recently Been in Any Inpatient Treatment (Hospital/Detox/Crisis Center/28-Day Program)? No   Name/Location of Program/Hospital:No data recorded  How Long Were You There? No data recorded  When Were You Discharged? No data recorded Have You Ever Received Services From [facility] Before? Yes   Who Do You See at [facility]? ED visits  Have You Recently Had Any Thoughts About Hurting Yourself? Yes   Are You Planning to Commit Suicide/Harm Yourself At This time?  Yes (Intentionally overdosed on 02/07 and on 02/04.)  Have you Recently Had Thoughts About Hurting Someone Karolee Ohs? No   Explanation: No data recorded Have You Used Any Alcohol or Drugs in the Past 24 Hours? Yes   How Long Ago Did You Use Drugs or Alcohol?  0000 (Unknown)   What Did You Use and How Much? -- (UTA)  What Do You Feel Would Help You the Most Today? Medication; Therapy  Do You Currently Have a Therapist/Psychiatrist? -- (Unknown)   Name of Therapist/Psychiatrist: No data recorded  Have You Been Recently Discharged From Any Office Practice or Programs? -- (UTA)   Explanation of Discharge From Practice/Program:  No data recorded    CCA Screening Triage Referral Assessment Type of Contact: Tele-Assessment   Is this Initial or Reassessment? Initial Assessment   Date Telepsych consult ordered in [facility]:  01/11/2021   Time Telepsych consult ordered in [facility]:  1404  Patient Reported Information Reviewed? Yes   Patient Left Without Being Seen? No data recorded  Reason  for Not Completing Assessment: No data recorded Collateral Involvement: No data recorded Does Patient Have a Court Appointed Legal Guardian? No data recorded  Name and Contact of Legal Guardian:  No data recorded If Minor and Not Living with Parent(s), Who has Custody? No data recorded Is CPS involved or ever been involved? No data recorded Is APS involved or ever been involved? No data recorded Patient  Determined To Be At Risk for Harm To Self or Others Based on Review of Patient Reported Information or Presenting Complaint? Yes, for Self-Harm   Method: No data recorded  Availability of Means: No data recorded  Intent: No data recorded  Notification Required: No data recorded  Additional Information for Danger to Others Potential:  No data recorded  Additional Comments for Danger to Others Potential:  No data recorded  Are There Guns or Other Weapons in Your Home?  No data recorded   Types of Guns/Weapons: No data recorded   Are These Weapons Safely Secured?                              No data recorded   Who Could Verify You Are Able To Have These Secured:    No data recorded Do You Have any Outstanding Charges, Pending Court Dates, Parole/Probation? No data recorded Contacted To Inform of Risk of Harm To Self or Others: -- (Boyfriend brought her to [facility].)  Location of Assessment: [facility] ED  Does Patient Present under Involuntary Commitment? No   IVC Papers Initial File Date: No data recorded  [place] of Residence: [place] (Information on face sheet.)  Patient Currently Receiving the Following Services: No data recorded  Determination of Need: Emergent (2 hours)   Options For Referral: Inpatient Hospitalization   Alexandria Lodge, LCAS

## 2021-01-12 NOTE — ED Notes (Addendum)
Status update: Pt has been accepted to [facility] and can arrive after 10am per counselor

## 2021-01-12 NOTE — Tx Team (Signed)
Initial Treatment Plan 01/12/2021 11:46 AM [name] PHX:[id number]    PATIENT STRESSORS: Financial difficulties Loss of relationships due to subtance abuse Substance abuse   PATIENT STRENGTHS: Ability for insight Capable of independent living Financial means Physical Health   PATIENT IDENTIFIED PROBLEMS: SI   Anxiety   depresson  Poly substance abuse               DISCHARGE CRITERIA:  Ability to meet basic life and health needs Improved stabilization in mood, thinking, and/or behavior Medical problems require only outpatient monitoring Reduction of life-threatening or endangering symptoms to within safe limits Safe-care adequate arrangements made Verbal commitment to aftercare and medication compliance  PRELIMINARY DISCHARGE PLAN: Attend aftercare/continuing care group Attend PHP/IOP Participate in family therapy Return to previous living arrangement Return to previous work or school arrangements  PATIENT/FAMILY INVOLVEMENT: This treatment plan has been presented to and reviewed with the patient, [place].The patient has been given the opportunity to ask questions and make suggestions.  Wardell Heath, RN 01/12/2021, 11:46 AM

## 2021-01-12 NOTE — Progress Notes (Signed)
   01/12/21 2129  Psych Admission Type (Psych Patients Only)  Admission Status Voluntary  Psychosocial Assessment  Patient Complaints Anxiety  Eye Contact Brief  Facial Expression Flat  Affect Anxious;Depressed  Speech Logical/coherent  Interaction Avoidant  Motor Activity Lethargic;Slow  Appearance/Hygiene Bizarre;Disheveled;Poor hygiene;In scrubs  Behavior Characteristics Cooperative  Mood Depressed;Anxious  Thought Process  Coherency WDL  Content WDL  Delusions None reported or observed  Perception WDL  Hallucination None reported or observed  Judgment WDL  Confusion WDL  Danger to Self  Current suicidal ideation? Denies  Danger to Others  Danger to Others None reported or observed  D: Patient in room sleeping on approach but woke to answer writers questions. Pt mood and affect appeared flat. Pt denies any withdrawal symptoms.  A: Medications administered as prescribed. Support and encouragement provided as needed.  R: Patient remains safe on the unit. Will continue to monitor for safety and stability.

## 2021-01-12 NOTE — BH Assessment (Addendum)
Pt has been accepted to [facility] and can arrive after 1000. Pt's provider and nurse, Dr. Floyd and Yasmeen RN, were provided this information at 0518.  Room: 301-1 Accepting: Ene Ajibola, NP Attending: Dr. Clary Call to Report: [phone] 

## 2021-01-12 NOTE — BHH Suicide Risk Assessment (Signed)
[facility] Admission Suicide Risk Assessment   Nursing information obtained from:    Demographic factors:    Current Mental Status:    Loss Factors:    Historical Factors:    Risk Reduction Factors:     Total Time spent with patient: 20 minutes Principal Problem: Overdose of antidepressant Diagnosis:  Principal Problem:   Overdose of antidepressant Active Problems:   Polysubstance abuse (HCC)   Anxiety   MDD (major depressive disorder), recurrent episode, severe (HCC)  Subjective Data: 35 year old woman with history of depression and polysubstance abuse presenting following a suicide attempt via intentional overdose   Continued Clinical Symptoms:    The "Alcohol Use Disorders Identification Test", Guidelines for Use in Primary Care, Second Edition.  World Science writer [facility]). Score between 0-7:  no or low risk or alcohol related problems. Score between 8-15:  moderate risk of alcohol related problems. Score between 16-19:  high risk of alcohol related problems. Score 20 or above:  warrants further diagnostic evaluation for alcohol dependence and treatment.   CLINICAL FACTORS:   Depression:   Anhedonia Comorbid alcohol abuse/dependence Hopelessness Impulsivity      COGNITIVE FEATURES THAT CONTRIBUTE TO RISK:  Closed-mindedness    SUICIDE RISK:   Severe:  Frequent, intense, and enduring suicidal ideation, specific plan, no subjective intent, but some objective markers of intent (i.e., choice of lethal method), the method is accessible, some limited preparatory behavior, evidence of impaired self-control, severe dysphoria/symptomatology, multiple risk factors present, and few if any protective factors, particularly a lack of social support.  PLAN OF CARE: Continue care on inpatient unit  I certify that inpatient services furnished can reasonably be expected to improve the patient's condition.   Clement Sayres, MD 01/12/2021, 3:53 PM

## 2021-01-12 NOTE — Telephone Encounter (Signed)
Patient has not been seen since 2019. Patient overdosed on Lyrica and Wellbutrin. She is currently in psychiatric care. Former Dr. Val Eagle patient. Thanks

## 2021-01-12 NOTE — ED Notes (Signed)
Spoke with [facility] pt not accepted for transport until after 7am

## 2021-01-12 NOTE — BH Assessment (Addendum)
Pt has been accepted to [facility] and can arrive after 1000. Pt's provider and nurse, Dr. Adela Lank and Tyrone Sage RN, were provided this information at [facility].  Room: 301-1 Accepting: Cecilio Asper, NP Attending: Dr. Jola Babinski Call to Report: [phone]
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
## 2021-01-13 LAB — FOLATE: Folate: 10.8 ng/mL (ref >5.9)

## 2021-01-13 LAB — LIPID PANEL
Cholesterol: 157 mg/dL (ref 0–200)
HDL: 41 mg/dL (ref >40)
LDL Cholesterol: 96 mg/dL (ref 0–99)
Total CHOL/HDL Ratio: 3.8 RATIO
Triglycerides: 102 mg/dL (ref <150)
VLDL: 20 mg/dL (ref 0–40)

## 2021-01-13 LAB — URINE CULTURE: Culture: >=100000 — AB

## 2021-01-13 LAB — HEMOGLOBIN A1C
Hgb A1c MFr Bld: 5.5 % (ref 4.8–5.6)
Mean Plasma Glucose: 111.15 mg/dL

## 2021-01-13 LAB — VITAMIN B12: Vitamin B-12: 127 pg/mL — ABNORMAL LOW (ref 180–914)

## 2021-01-13 LAB — TSH: TSH: 0.656 u[IU]/mL (ref 0.350–4.500)

## 2021-01-13 MED ORDER — SERTRALINE HCL 100 MG PO TABS
100.0000 mg | ORAL_TABLET | Freq: Every day | ORAL | Status: DC
  Administered 2021-01-14 – 2021-01-15 (×2): 100 mg via ORAL
  Filled 2021-01-13 (×4): qty 1

## 2021-01-13 MED ORDER — VITAMIN B-12 1000 MCG PO TABS
1000.0000 ug | ORAL_TABLET | Freq: Every day | ORAL | Status: DC
  Administered 2021-01-13 – 2021-01-15 (×3): 1000 ug via ORAL
  Filled 2021-01-13 (×5): qty 1

## 2021-01-13 NOTE — Progress Notes (Signed)
Recreation Therapy Notes  Date: 2.9.22 Time: 0930 Location: [address] Dayroom  Group Topic: Stress Management  Goal Area(s) Addresses:  Patient will identify positive stress management techniques. Patient will identify benefits of using stress management post d/c.  Intervention: Stress Management  Activity: Meditation.  LRT played a body scan meditation that focused on taking inventory of any sensations and feelings the body may be going through.  Patients were to listen and follow along as the meditation was played    Education:  Stress Management, Discharge Planning.   Education Outcome: Acknowledges Education  Clinical Observations/Feedback: Pt did not attend group session.    Caroll Rancher, LRT/CTRS         Caroll Rancher A 01/13/2021 10:54 AM

## 2021-01-13 NOTE — Progress Notes (Signed)
Upon assessment, pt was resting in her bed and was easily aroused. Pt shared that she did attend group during the day and worked on identifying coping mechanisms for her triggers. Her plan upon discharge is to go to her boyfriend's house instead of her Mom's. Pt denies having any withdrawal symptoms or any side effects from her medications. She did attend group tonight and interacted with her peers. Pt denies SI/HI and AVH. Active listening, reassurance, and support provided. Q 15 min safety checks continue. Pt's safety has been maintained.   01/13/21 2025  Psych Admission Type (Psych Patients Only)  Admission Status Voluntary  Psychosocial Assessment  Patient Complaints Anxiety;Depression;Worrying  Eye Contact Fair  Facial Expression Flat  Affect Appropriate to circumstance;Flat  Speech Logical/coherent  Interaction Minimal;Forwards little  Motor Activity Slow  Appearance/Hygiene Disheveled  Behavior Characteristics Cooperative;Appropriate to situation;Anxious  Mood Depressed;Anxious;Pleasant  Thought Process  Coherency WDL  Content WDL  Delusions None reported or observed  Perception WDL  Hallucination None reported or observed  Judgment Poor  Confusion None  Danger to Self  Current suicidal ideation? Denies  Danger to Others  Danger to Others None reported or observed

## 2021-01-13 NOTE — [facility] Group Notes (Signed)
[facility] LCSW Group Therapy  01/13/2021 2:24 PM  Type of Therapy:  Gratitude   Participation Level:  Did Not Attend  Participation Quality:  Did not attend   Affect:  Did not attend   Cognitive:  Did not attend  Insight:  Did not attend  Engagement in Therapy:  Did not attend   Modes of Intervention:  Activity and Discussion  Summary of Progress/Problems: Pt did not attend the group   Aram Beecham 01/13/2021, 2:24 PM

## 2021-01-13 NOTE — Telephone Encounter (Signed)
Call came from [facility].

## 2021-01-13 NOTE — Progress Notes (Signed)
Adult Psychoeducational Group Note  Date:  01/13/2021 Time:  10:00 PM  Group Topic/Focus:  Wrap-Up Group:   The focus of this group is to help patients review their daily goal of treatment and discuss progress on daily workbooks.  Participation Level:  Minimal  Participation Quality:  Appropriate  Affect:  Appropriate  Cognitive:  Oriented  Insight: Appropriate  Engagement in Group:  Engaged  Modes of Intervention:  Education and Support  Additional Comments:  Patient attended and participated in group tonight. She report having a good day. She rested, talked to her mother which was significant and eat good food.  Lita Mains [facility] 01/13/2021, 10:00 PM
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
## 2021-01-13 NOTE — Telephone Encounter (Signed)
Per Romeo Apple below message is for documentation only. AS, CMA

## 2021-01-13 NOTE — Progress Notes (Signed)
Patient presents with a flat affect and mood is congruent. She denies SI/HI. She reports decreased depression and anxiety today. She reports fair sleep last night and sleep time shows she slept 6.75 hours. She reports having a fair appetite. She denies withdrawal symptoms.   Orders reviewed. Vital signs reviewed. Verbal support provided. 15 minute checks performed for safety.   Pt compliant with treatment plan. She denies having any side effects to the medications.

## 2021-01-13 NOTE — Tx Team (Signed)
Interdisciplinary Treatment and Diagnostic Plan Update  01/13/2021 Time of Session: 9:00am  Erika Taylor MRN: [id number]  Principal Diagnosis: Overdose of antidepressant  Secondary Diagnoses: Principal Problem:   Overdose of antidepressant Active Problems:   Polysubstance abuse ([place])   Anxiety   MDD (major depressive disorder), recurrent episode, severe ([place])   Current Medications:  Current Facility-Administered Medications  Medication Dose Route Frequency Provider Last Rate Last Admin  . acetaminophen (TYLENOL) tablet 650 mg  650 mg Oral Q6H PRN Connye Burkitt, NP      . alum & mag hydroxide-simeth (MAALOX/MYLANTA) 200-200-20 MG/5ML suspension 30 mL  30 mL Oral Q4H PRN Connye Burkitt, NP      . ARIPiprazole (ABILIFY) tablet 5 mg  5 mg Oral Daily Briant Cedar, MD   5 mg at 01/13/21 0806  . folic acid (FOLVITE) tablet 1 mg  1 mg Oral Daily Briant Cedar, MD   1 mg at 01/13/21 7124  . hydrOXYzine (ATARAX/VISTARIL) tablet 25 mg  25 mg Oral Q6H PRN Connye Burkitt, NP   25 mg at 01/12/21 2114  . loperamide (IMODIUM) capsule 2-4 mg  2-4 mg Oral PRN Connye Burkitt, NP      . LORazepam (ATIVAN) tablet 1 mg  1 mg Oral Q6H PRN Connye Burkitt, NP      . magnesium hydroxide (MILK OF MAGNESIA) suspension 30 mL  30 mL Oral Daily PRN Connye Burkitt, NP      . multivitamin with minerals tablet 1 tablet  1 tablet Oral Daily Connye Burkitt, NP   1 tablet at 01/13/21 0806  . ondansetron (ZOFRAN-ODT) disintegrating tablet 4 mg  4 mg Oral Q6H PRN Connye Burkitt, NP      . sertraline (ZOLOFT) tablet 50 mg  50 mg Oral Daily Briant Cedar, MD   50 mg at 01/13/21 0806  . thiamine tablet 100 mg  100 mg Oral Daily Connye Burkitt, NP   100 mg at 01/13/21 5809  . traZODone (DESYREL) tablet 50 mg  50 mg Oral QHS PRN Connye Burkitt, NP   50 mg at 01/12/21 2114   PTA Medications: Facility-Administered Medications Prior to Admission  Medication Dose Route Frequency Provider Last Rate Last  Admin  . medroxyPROGESTERone (DEPO-PROVERA) injection 150 mg  150 mg Intramuscular Q90 days Woodroe Mode, MD   150 mg at 11/16/20 1617   Medications Prior to Admission  Medication Sig Dispense Refill Last Dose  . ARIPiprazole (ABILIFY) 10 MG tablet Take 10 mg by mouth daily.     Marland Kitchen buPROPion (WELLBUTRIN SR) 200 MG 12 hr tablet Take 200 mg by mouth daily.     . busPIRone (BUSPAR) 10 MG tablet Take 10 mg by mouth 3 (three) times daily.     . metroNIDAZOLE (FLAGYL) 500 MG tablet Take 1 tablet (500 mg total) by mouth 2 (two) times daily. (Patient not taking: No sig reported) 14 tablet 0   . naproxen (NAPROSYN) 500 MG tablet Take 1 tablet (500 mg total) by mouth 2 (two) times daily with a meal. 30 tablet 0   . tiZANidine (ZANAFLEX) 4 MG tablet Take 1 tablet (4 mg total) by mouth every 8 (eight) hours as needed. 30 tablet 0   . zolpidem (AMBIEN) 10 MG tablet Take 1 tablet (10 mg total) by mouth at bedtime as needed for sleep. (Patient not taking: No sig reported) 30 tablet 3     Patient Stressors: Financial difficulties Loss of  relationships due to subtance abuse Substance abuse  Patient Strengths: Ability for insight Capable of independent living Financial means Physical Health  Treatment Modalities: Medication Management, Group therapy, Case management,  1 to 1 session with clinician, Psychoeducation, Recreational therapy.   Physician Treatment Plan for Primary Diagnosis: Overdose of antidepressant Long Term Goal(s): Improvement in symptoms so as ready for discharge Improvement in symptoms so as ready for discharge   Short Term Goals: Ability to identify changes in lifestyle to reduce recurrence of condition will improve Ability to verbalize feelings will improve Ability to disclose and discuss suicidal ideas Ability to demonstrate self-control will improve Ability to identify and develop effective coping behaviors will improve Ability to identify triggers associated with substance  abuse/mental health issues will improve Ability to identify changes in lifestyle to reduce recurrence of condition will improve Ability to verbalize feelings will improve Ability to disclose and discuss suicidal ideas Ability to demonstrate self-control will improve Ability to identify and develop effective coping behaviors will improve Ability to identify triggers associated with substance abuse/mental health issues will improve  Medication Management: Evaluate patient's response, side effects, and tolerance of medication regimen.  Therapeutic Interventions: 1 to 1 sessions, Unit Group sessions and Medication administration.  Evaluation of Outcomes: Not Met  Physician Treatment Plan for Secondary Diagnosis: Principal Problem:   Overdose of antidepressant Active Problems:   Polysubstance abuse ([facility])   Anxiety   MDD (major depressive disorder), recurrent episode, severe ([place])  Long Term Goal(s): Improvement in symptoms so as ready for discharge Improvement in symptoms so as ready for discharge   Short Term Goals: Ability to identify changes in lifestyle to reduce recurrence of condition will improve Ability to verbalize feelings will improve Ability to disclose and discuss suicidal ideas Ability to demonstrate self-control will improve Ability to identify and develop effective coping behaviors will improve Ability to identify triggers associated with substance abuse/mental health issues will improve Ability to identify changes in lifestyle to reduce recurrence of condition will improve Ability to verbalize feelings will improve Ability to disclose and discuss suicidal ideas Ability to demonstrate self-control will improve Ability to identify and develop effective coping behaviors will improve Ability to identify triggers associated with substance abuse/mental health issues will improve     Medication Management: Evaluate patient's response, side effects, and tolerance of  medication regimen.  Therapeutic Interventions: 1 to 1 sessions, Unit Group sessions and Medication administration.  Evaluation of Outcomes: Not Met   RN Treatment Plan for Primary Diagnosis: Overdose of antidepressant Long Term Goal(s): Knowledge of disease and therapeutic regimen to maintain health will improve  Short Term Goals: Ability to remain free from injury will improve, Ability to participate in decision making will improve, Ability to verbalize feelings will improve, Ability to disclose and discuss suicidal ideas and Ability to identify and develop effective coping behaviors will improve  Medication Management: RN will administer medications as ordered by provider, will assess and evaluate patient's response and provide education to patient for prescribed medication. RN will report any adverse and/or side effects to prescribing provider.  Therapeutic Interventions: 1 on 1 counseling sessions, Psychoeducation, Medication administration, Evaluate responses to treatment, Monitor vital signs and CBGs as ordered, Perform/monitor CIWA, COWS, AIMS and Fall Risk screenings as ordered, Perform wound care treatments as ordered.  Evaluation of Outcomes: Not Met   LCSW Treatment Plan for Primary Diagnosis: Overdose of antidepressant Long Term Goal(s): Safe transition to appropriate next level of care at discharge, Engage patient in therapeutic group addressing interpersonal concerns.  Short Term Goals: Engage patient in aftercare planning with referrals and resources, Increase emotional regulation, Facilitate acceptance of mental health diagnosis and concerns, Facilitate patient progression through stages of change regarding substance use diagnoses and concerns and Identify triggers associated with mental health/substance abuse issues  Therapeutic Interventions: Assess for all discharge needs, 1 to 1 time with Social worker, Explore available resources and support systems, Assess for adequacy  in community support network, Educate family and significant other(s) on suicide prevention, Complete Psychosocial Assessment, Interpersonal group therapy.  Evaluation of Outcomes: Not Met   Progress in Treatment: Attending groups: No. Participating in groups: No. Taking medication as prescribed: Yes. Toleration medication: Yes. Family/Significant other contact made: Yes, individual(s) contacted:  Boyfriend  Patient understands diagnosis: Yes. and No. Discussing patient identified problems/goals with staff: Yes. Medical problems stabilized or resolved: Yes. Denies suicidal/homicidal ideation: Yes. Issues/concerns per patient self-inventory: No.   New problem(s) identified: No, Describe:  None   New Short Term/Long Term Goal(s): medication stabilization, elimination of SI thoughts, development of comprehensive mental wellness plan.   Patient Goals:  "To get some rest"   Discharge Plan or Barriers: Patient recently admitted. CSW will continue to follow and assess for appropriate referrals and possible discharge planning.   Reason for Continuation of Hospitalization: Depression Medication stabilization Suicidal ideation Withdrawal symptoms  Estimated Length of Stay: 3 to 5 days   Attendees: Patient: Erika Taylor  01/13/2021   Physician: Lala Lund, MD 01/13/2021   Nursing:  01/13/2021   RN Care Manager: 01/13/2021   Social Worker: Sable Feil, [place] 01/13/2021   Recreational Therapist:  01/13/2021   Other:  01/13/2021   Other:  01/13/2021   Other: 01/13/2021     Scribe for Treatment Team: Darleen Crocker, LCSWA 01/13/2021 9:15 AM

## 2021-01-13 NOTE — [facility] Suicide Risk Assessment (Signed)
[facility] INPATIENT:  Family/Significant Other Suicide Prevention Education  Suicide Prevention Education:  Education Completed; Erika Taylor [phone] (Boyfriend) has been identified by the patient as the family member/significant other with whom the patient will be residing, and identified as the person(s) who will aid the patient in the event of a mental health crisis (suicidal ideations/suicide attempt).  With written consent from the patient, the family member/significant other has been provided the following suicide prevention education, prior to the and/or following the discharge of the patient.  The suicide prevention education provided includes the following:  Suicide risk factors  Suicide prevention and interventions  National Suicide Hotline telephone number  [facility] assessment telephone number  [facility] Emergency Assistance 911  [facility] and/or Residential Mobile Crisis Unit telephone number  Request made of family/significant other to:  Remove weapons (e.g., guns, rifles, knives), all items previously/currently identified as safety concern.    Remove drugs/medications (over-the-counter, prescriptions, illicit drugs), all items previously/currently identified as a safety concern.  The family member/significant other verbalizes understanding of the suicide prevention education information provided.  The family member/significant other agrees to remove the items of safety concern listed above.   CSW spoke with Mr. Erika Taylor who states that Erika Taylor has been living with him for approximately 4 years.  Mr. Erika Taylor states that Erika Taylor has been using prescription pills and "hard drugs".  Mr. Erika Taylor did not specify which substances Erika Taylor was using.  Mr. Erika Taylor states that Erika Taylor has been off of her medications for several months and has been helping her adopted mother who has Dementia and Cancer.  Mr. Erika Taylor states that he would like Erika Taylor to go to  residential treatment for her substance use.  Mr. Erika Taylor states that even if Erika Taylor does not go to residential treatment she is still allowed to come home and he will help her pick a residential treatment from home. Mr. Erika Taylor also wanted to make sure that Erika Taylor receives 3 hot meals a day.  Mr. Erika Taylor states that there are no firearms or weapons in the home.  CSW completed SPE with Mr. Erika Taylor.   Erika Taylor 01/13/2021, 11:23 AM

## 2021-01-13 NOTE — Progress Notes (Signed)
   01/13/21 2025  COVID-19 Daily Checkoff  Have you had a fever (temp > 37.80C/100F)  in the past 24 hours?  No  COVID-19 EXPOSURE  Have you traveled outside the state in the past 14 days? No  Have you been in contact with someone with a confirmed diagnosis of COVID-19 or PUI in the past 14 days without wearing appropriate PPE? No  Have you been living in the same home as a person with confirmed diagnosis of COVID-19 or a PUI (household contact)? No  Have you been diagnosed with COVID-19? No

## 2021-01-13 NOTE — Progress Notes (Signed)
[facility] MD Progress Note  01/13/2021 7:04 AM Erika Taylor  MRN:  [id number] Subjective:   Miss. Erika Taylor is a 35 yr old female who presents with MDD, Recurrent, Severe, and suicide attempt (Overdose on Lyrica and Wellbutrin). PPHx is significant for MDD, Recurrent, Severe and Bipolar.  She reports that she is doing alright this morning. She reports that her sleep was good. She reports that her appetite is good. She reports no SI, HI, or AVH. Discussed that starting these new medications will take a few days to begin to take affect. Discussed importance of attending group therapy, both getting out of the room and meeting other patients on the unit and developing and refining coping skills. She reports no other concerns at this time.   Principal Problem: Overdose of antidepressant Diagnosis: Principal Problem:   Overdose of antidepressant Active Problems:   Polysubstance abuse (HCC)   Anxiety   MDD (major depressive disorder), recurrent episode, severe (HCC)  Total Time spent with patient: 15 minutes  Past Psychiatric History: MDD, Recurrent, Severe, Bipolar.  Past Medical History:  Past Medical History:  Diagnosis Date  . Depression    History reviewed. No pertinent surgical history. Family History:  Family History  Adopted: Yes  Problem Relation Age of Onset  . Healthy Mother   . Healthy Father    Family Psychiatric  History: Reports none Social History:  Social History   Substance and Sexual Activity  Alcohol Use Yes  . Alcohol/week: 0.0 standard drinks   Comment: social     Social History   Substance and Sexual Activity  Drug Use Yes  . Types: Cocaine, Benzodiazepines, Barbituates, Marijuana   Comment: heroin    Social History   Socioeconomic History  . Marital status: Single    Spouse name: Not on file  . Number of children: 1  . Years of education: 76  . Highest education level: Associate degree: academic program  Occupational History  . Not on file  Tobacco  Use  . Smoking status: Former Smoker    Packs/day: 1.00    Years: 12.00    Pack years: 12.00    Types: Cigarettes    Start date: 09/14/2015  . Smokeless tobacco: Never Used  Vaping Use  . Vaping Use: Never used  Substance and Sexual Activity  . Alcohol use: Yes    Alcohol/week: 0.0 standard drinks    Comment: social  . Drug use: Yes    Types: Cocaine, Benzodiazepines, Barbituates, Marijuana    Comment: heroin  . Sexual activity: Yes    Birth control/protection: I.U.D., None  Other Topics Concern  . Not on file  Social History Narrative  . Not on file   Social Determinants of Health   Financial Resource Strain: Not on file  Food Insecurity: No Food Insecurity  . Worried About Programme researcher, broadcasting/film/video in the Last Year: Never true  . Ran Out of Food in the Last Year: Never true  Transportation Needs: No Transportation Needs  . Lack of Transportation (Medical): No  . Lack of Transportation (Non-Medical): No  Physical Activity: Not on file  Stress: Not on file  Social Connections: Not on file   Additional Social History:                         Sleep: Good  Appetite:  Good  Current Medications: Current Facility-Administered Medications  Medication Dose Route Frequency Provider Last Rate Last Admin  . acetaminophen (TYLENOL) tablet 650 mg  650 mg Oral Q6H PRN Aldean Baker, NP      . alum & mag hydroxide-simeth (MAALOX/MYLANTA) 200-200-20 MG/5ML suspension 30 mL  30 mL Oral Q4H PRN Aldean Baker, NP      . ARIPiprazole (ABILIFY) tablet 5 mg  5 mg Oral Daily Masayo Fera, Mardelle Matte, MD      . folic acid (FOLVITE) tablet 1 mg  1 mg Oral Daily Lauro Franklin, MD   1 mg at 01/12/21 1530  . hydrOXYzine (ATARAX/VISTARIL) tablet 25 mg  25 mg Oral Q6H PRN Aldean Baker, NP   25 mg at 01/12/21 2114  . loperamide (IMODIUM) capsule 2-4 mg  2-4 mg Oral PRN Aldean Baker, NP      . LORazepam (ATIVAN) tablet 1 mg  1 mg Oral Q6H PRN Aldean Baker, NP      . magnesium  hydroxide (MILK OF MAGNESIA) suspension 30 mL  30 mL Oral Daily PRN Aldean Baker, NP      . multivitamin with minerals tablet 1 tablet  1 tablet Oral Daily Aldean Baker, NP   1 tablet at 01/12/21 1248  . ondansetron (ZOFRAN-ODT) disintegrating tablet 4 mg  4 mg Oral Q6H PRN Aldean Baker, NP      . sertraline (ZOLOFT) tablet 50 mg  50 mg Oral Daily Larinda Herter, Mardelle Matte, MD      . thiamine tablet 100 mg  100 mg Oral Daily Aldean Baker, NP      . traZODone (DESYREL) tablet 50 mg  50 mg Oral QHS PRN Aldean Baker, NP   50 mg at 01/12/21 2114    Lab Results:  Results for orders placed or performed during the hospital encounter of 01/11/21 (from the past 48 hour(s))  SARS Coronavirus 2 by RT PCR (hospital order, performed in [facility] hospital lab) Nasopharyngeal Nasopharyngeal Swab     Status: None   Collection Time: 01/12/21  3:00 AM   Specimen: Nasopharyngeal Swab  Result Value Ref Range   SARS Coronavirus 2 NEGATIVE NEGATIVE    Comment: (NOTE) SARS-CoV-2 target nucleic acids are NOT DETECTED.  The SARS-CoV-2 RNA is generally detectable in upper and lower respiratory specimens during the acute phase of infection. The lowest concentration of SARS-CoV-2 viral copies this assay can detect is 250 copies / mL. A negative result does not preclude SARS-CoV-2 infection and should not be used as the sole basis for treatment or other patient management decisions.  A negative result may occur with improper specimen collection / handling, submission of specimen other than nasopharyngeal swab, presence of viral mutation(s) within the areas targeted by this assay, and inadequate number of viral copies (<250 copies / mL). A negative result must be combined with clinical observations, patient history, and epidemiological information.  Fact Sheet for Patients:   BoilerBrush.com.cy  Fact Sheet for Healthcare Providers: https://pope.com/  This  test is not yet approved or  cleared by the [place] FDA and has been authorized for detection and/or diagnosis of SARS-CoV-2 by FDA under an Emergency Use Authorization (EUA).  This EUA will remain in effect (meaning this test can be used) for the duration of the COVID-19 declaration under Section 564(b)(1) of the Act, 21 U.S.C. section 360bbb-3(b)(1), unless the authorization is terminated or revoked sooner.  Performed at [facility], [address], [place], [place] [address]     Blood Alcohol level:  Lab Results  Component Value Date   ETH 24 (H) 01/11/2021  ETH 114 (H) 09/14/2018    Metabolic Disorder Labs: No results found for: HGBA1C, MPG No results found for: PROLACTIN No results found for: CHOL, TRIG, HDL, CHOLHDL, VLDL, LDLCALC  Physical Findings: AIMS:  , ,  ,  ,    CIWA:  CIWA-Ar Total: 1 COWS:     Musculoskeletal: Strength & Muscle Tone: within normal limits Gait & Station: normal Patient leans: N/A  Psychiatric Specialty Exam: Physical Exam Vitals and nursing note reviewed.  Constitutional:      General: She is not in acute distress.    Appearance: Normal appearance. She is normal weight. She is not ill-appearing, toxic-appearing or diaphoretic.  HENT:     Head: Normocephalic and atraumatic.  Cardiovascular:     Rate and Rhythm: Normal rate.  Pulmonary:     Effort: Pulmonary effort is normal.  Musculoskeletal:        General: Normal range of motion.  Neurological:     General: No focal deficit present.     Mental Status: She is alert.     Review of Systems  Constitutional: Negative for fatigue and fever.  Respiratory: Negative for chest tightness and shortness of breath.   Cardiovascular: Negative for chest pain and palpitations.  Gastrointestinal: Negative for abdominal pain, constipation, diarrhea, nausea and vomiting.  Neurological: Negative for dizziness, weakness, light-headedness and headaches.  Psychiatric/Behavioral:  Negative for suicidal ideas.    Blood pressure 117/69, pulse 72, temperature 98 F (36.7 C), temperature source Oral, resp. rate 18, height 5\' 9"  (1.753 m), weight 78.7 kg, SpO2 98 %.Body mass index is 25.62 kg/m.  General Appearance: Casual  Eye Contact:  Good  Speech:  Clear and Coherent and Normal Rate  Volume:  Normal  Mood:  Dysphoric  Affect:  Flat  Thought Process:  Coherent  Orientation:  Full (Time, Place, and Person)  Thought Content:  Logical  Suicidal Thoughts:  No  Homicidal Thoughts:  No  Memory:  Immediate;   Good Recent;   Good  Judgement:  Intact  Insight:  Present  Psychomotor Activity:  Normal  Concentration:  Concentration: Good and Attention Span: Good  Recall:  Good  Fund of Knowledge:  Good  Language:  Good  Akathisia:  No  Handed:  Right  AIMS (if indicated):     Assets:  Resilience  ADL's:  Intact  Cognition:  WNL  Sleep:  Number of Hours: 6.75     Treatment Plan Summary: Daily contact with patient to assess and evaluate symptoms and progress in treatment  Miss. Erika Taylor is a 35 yr old female who presents with MDD, Recurrent, Severe, and suicide attempt (Overdose on Lyrica and Wellbutrin). PPHx is significant for MDD, Recurrent, Severe and Bipolar.  She reports doing ok today. Will increase her Zoloft tomorrow morning. B12 is low so will start B12 supplementation. Will continue to monitor.    MDD, Recurrent, Severe  Suicide Attempt: -Continue Abilify 5 mg daily -Increase zoloft to 100 mg tomorrow morning    Withdrawal: -Continue CIWA -Continue Ativan 1 mg q6 PRN CIWA>10   Macrocytic Anemia: -Continue Thiamine 100 mg daily -Continue folic acid 1 mg daily -Folate returned as normal at 10.8 -B12 is low at 127 -Start B12 PO 1000 mcg   -Start PRN's: Tylenol, Maalox, Atarax, Milk of Magnesia, Trazodone   20, MD 01/13/2021, 7:04 AM

## 2021-01-13 NOTE — Progress Notes (Signed)
Participation Level: Active  Engagement in Group: The patient shared and was able to identify one goal for this hospitalization and three coping skills for depression/anxiety.   Type of Group: The focus of this group is to help patients establish daily goals and coping skills to achieve during treatment and discuss how the patient can incorporate goal and coping skills into their daily lives to aide in recovery. Patients received handouts on ways to cope with mental health disorders.    

## 2021-01-14 MED ORDER — NICOTINE 14 MG/24HR TD PT24
14.0000 mg | MEDICATED_PATCH | Freq: Every day | TRANSDERMAL | Status: DC
  Administered 2021-01-14 – 2021-01-15 (×2): 14 mg via TRANSDERMAL
  Filled 2021-01-14 (×4): qty 1

## 2021-01-14 NOTE — Progress Notes (Signed)
Pt denies SI/HI/AVH.  Pt rated Depression and Anxiety at a 3 out of 10, with 10 be the highest amount.  Pt has been relaxed and interacting with peers on the unit.  Pt appears to be in no acute distress.  Pt will remain on 15 min checks in order to make sure pt is safe and needs are known.

## 2021-01-14 NOTE — Progress Notes (Signed)
   01/14/21 2120  COVID-19 Daily Checkoff  Have you had a fever (temp > 37.80C/100F)  in the past 24 hours?  No  COVID-19 EXPOSURE  Have you traveled outside the state in the past 14 days? No  Have you been in contact with someone with a confirmed diagnosis of COVID-19 or PUI in the past 14 days without wearing appropriate PPE? No  Have you been living in the same home as a person with confirmed diagnosis of COVID-19 or a PUI (household contact)? No  Have you been diagnosed with COVID-19? No

## 2021-01-14 NOTE — Progress Notes (Signed)
[facility] MD Progress Note  01/14/2021 7:01 AM Sakeenah Valcarcel  MRN:  [id number] Subjective:   Miss. Cuthrell is a 35 yr old female who presents with MDD, Recurrent, Severe, and suicide attempt (Overdose on Lyrica and Wellbutrin). PPHx is significant for MDD, Recurrent, Severe and Bipolar.  She reports that she has had no side effects from her medications and is feeling better. She reports she had great benefit from attending group therapy yesterday. She reports beginning to learn coping skill sand how to recognize her triggers. When asked about her overdose on 2/4 she reports that this was the first time she had ever done heroin and that it was too strong and that it was an accidental overdose. She reports that she uses cocaine once every 2-3 weeks. She reports no SI, HI, or AVH. Encouraged her to continue to attend group therapy. She reports that her sons birthday is Saturday and wanted to know if she could be discharged by then. Discussed that this is a day by day process and will have to see how she does over the next days. She reported understanding.   Principal Problem: Overdose of antidepressant Diagnosis: Principal Problem:   Overdose of antidepressant Active Problems:   Polysubstance abuse (HCC)   Anxiety   MDD (major depressive disorder), recurrent episode, severe (HCC)  Total Time spent with patient: 15 minutes  Past Psychiatric History: MDD, Recurrent, Severe, Bipolar.  Past Medical History:  Past Medical History:  Diagnosis Date  . Depression    History reviewed. No pertinent surgical history. Family History:  Family History  Adopted: Yes  Problem Relation Age of Onset  . Healthy Mother   . Healthy Father    Family Psychiatric  History: Reports none Social History:  Social History   Substance and Sexual Activity  Alcohol Use Yes  . Alcohol/week: 0.0 standard drinks   Comment: social     Social History   Substance and Sexual Activity  Drug Use Yes  . Types: Cocaine,  Benzodiazepines, Barbituates, Marijuana   Comment: heroin    Social History   Socioeconomic History  . Marital status: Single    Spouse name: Not on file  . Number of children: 1  . Years of education: 67  . Highest education level: Associate degree: academic program  Occupational History  . Not on file  Tobacco Use  . Smoking status: Former Smoker    Packs/day: 1.00    Years: 12.00    Pack years: 12.00    Types: Cigarettes    Start date: 09/14/2015  . Smokeless tobacco: Never Used  Vaping Use  . Vaping Use: Never used  Substance and Sexual Activity  . Alcohol use: Yes    Alcohol/week: 0.0 standard drinks    Comment: social  . Drug use: Yes    Types: Cocaine, Benzodiazepines, Barbituates, Marijuana    Comment: heroin  . Sexual activity: Yes    Birth control/protection: I.U.D., None  Other Topics Concern  . Not on file  Social History Narrative  . Not on file   Social Determinants of Health   Financial Resource Strain: Not on file  Food Insecurity: No Food Insecurity  . Worried About Programme researcher, broadcasting/film/video in the Last Year: Never true  . Ran Out of Food in the Last Year: Never true  Transportation Needs: No Transportation Needs  . Lack of Transportation (Medical): No  . Lack of Transportation (Non-Medical): No  Physical Activity: Not on file  Stress: Not on file  Social  Connections: Not on file   Additional Social History:                         Sleep: Good  Appetite:  Good  Current Medications: Current Facility-Administered Medications  Medication Dose Route Frequency Provider Last Rate Last Admin  . acetaminophen (TYLENOL) tablet 650 mg  650 mg Oral Q6H PRN Aldean Baker, NP      . alum & mag hydroxide-simeth (MAALOX/MYLANTA) 200-200-20 MG/5ML suspension 30 mL  30 mL Oral Q4H PRN Aldean Baker, NP      . ARIPiprazole (ABILIFY) tablet 5 mg  5 mg Oral Daily Lauro Franklin, MD   5 mg at 01/13/21 0806  . folic acid (FOLVITE) tablet 1 mg  1  mg Oral Daily Lauro Franklin, MD   1 mg at 01/13/21 1610  . hydrOXYzine (ATARAX/VISTARIL) tablet 25 mg  25 mg Oral Q6H PRN Aldean Baker, NP   25 mg at 01/13/21 2111  . loperamide (IMODIUM) capsule 2-4 mg  2-4 mg Oral PRN Aldean Baker, NP      . LORazepam (ATIVAN) tablet 1 mg  1 mg Oral Q6H PRN Aldean Baker, NP      . magnesium hydroxide (MILK OF MAGNESIA) suspension 30 mL  30 mL Oral Daily PRN Aldean Baker, NP      . multivitamin with minerals tablet 1 tablet  1 tablet Oral Daily Aldean Baker, NP   1 tablet at 01/13/21 0806  . nicotine (NICODERM CQ - dosed in mg/24 hours) patch 14 mg  14 mg Transdermal Daily Nira Conn A, NP      . ondansetron (ZOFRAN-ODT) disintegrating tablet 4 mg  4 mg Oral Q6H PRN Aldean Baker, NP      . sertraline (ZOLOFT) tablet 100 mg  100 mg Oral Daily Jaimen Melone, Mardelle Matte, MD      . thiamine tablet 100 mg  100 mg Oral Daily Aldean Baker, NP   100 mg at 01/13/21 0806  . traZODone (DESYREL) tablet 50 mg  50 mg Oral QHS PRN Aldean Baker, NP   50 mg at 01/13/21 2111  . vitamin B-12 (CYANOCOBALAMIN) tablet 1,000 mcg  1,000 mcg Oral Daily Lauro Franklin, MD   1,000 mcg at 01/13/21 1134    Lab Results:  Results for orders placed or performed during the hospital encounter of 01/12/21 (from the past 48 hour(s))  TSH     Status: None   Collection Time: 01/13/21  6:30 AM  Result Value Ref Range   TSH 0.656 0.350 - 4.500 uIU/mL    Comment: Performed by a 3rd Generation assay with a functional sensitivity of <=0.01 uIU/mL. Performed at [facility], [address] [place], [place] [address]   Hemoglobin A1c     Status: None   Collection Time: 01/13/21  6:30 AM  Result Value Ref Range   Hgb A1c MFr Bld 5.5 4.8 - 5.6 %    Comment: (NOTE) Pre diabetes:          5.7%-6.4%  Diabetes:              >6.4%  Glycemic control for   <7.0% adults with diabetes    Mean Plasma Glucose 111.15 mg/dL    Comment: Performed at [facility], [address], [place], [place] [address]  Lipid panel     Status: None   Collection Time: 01/13/21  6:30 AM  Result Value Ref Range   Cholesterol 157 0 - 200 mg/dL   Triglycerides 270 <786 mg/dL   HDL 41 >75 mg/dL   Total CHOL/HDL Ratio 3.8 RATIO   VLDL 20 0 - 40 mg/dL   LDL Cholesterol 96 0 - 99 mg/dL    Comment:        Total Cholesterol/HDL:CHD Risk Coronary Heart Disease Risk Table                     Men   Women  1/2 Average Risk   3.4   3.3  Average Risk       5.0   4.4  2 X Average Risk   9.6   7.1  3 X Average Risk  23.4   11.0        Use the calculated Patient Ratio above and the CHD Risk Table to determine the patient's CHD Risk.        ATP III CLASSIFICATION (LDL):  <100     mg/dL   Optimal  449-201  mg/dL   Near or Above                    Optimal  130-159  mg/dL   Borderline  007-121  mg/dL   High  >975     mg/dL   Very High Performed at [facility], [address] [place], [place] [address]   Vitamin B12     Status: Abnormal   Collection Time: 01/13/21  6:30 AM  Result Value Ref Range   Vitamin B-12 127 (L) 180 - 914 pg/mL    Comment: (NOTE) This assay is not validated for testing neonatal or myeloproliferative syndrome specimens for Vitamin B12 levels. Performed at [facility], [address] [place], [place] [address]   Folate, serum, performed at [facility] lab     Status: None   Collection Time: 01/13/21  6:30 AM  Result Value Ref Range   Folate 10.8 >5.9 ng/mL    Comment: Performed at [facility], [address], [place], [place] [address]    Blood Alcohol level:  Lab Results  Component Value Date   ETH 24 (H) 01/11/2021   ETH 114 (H) 09/14/2018    Metabolic Disorder Labs: Lab Results  Component Value Date   HGBA1C 5.5 01/13/2021   MPG 111.15 01/13/2021   No results found for: PROLACTIN Lab Results  Component Value Date   CHOL 157 01/13/2021   TRIG 102  01/13/2021   HDL 41 01/13/2021   CHOLHDL 3.8 01/13/2021   VLDL 20 01/13/2021   LDLCALC 96 01/13/2021    Physical Findings: AIMS: Facial and Oral Movements Muscles of Facial Expression: None, normal Lips and Perioral Area: None, normal Jaw: None, normal Tongue: None, normal,Extremity Movements Upper (arms, wrists, hands, fingers): None, normal Lower (legs, knees, ankles, toes): None, normal, Trunk Movements Neck, shoulders, hips: None, normal, Overall Severity Severity of abnormal movements (highest score from questions above): None, normal Incapacitation due to abnormal movements: None, normal Patient's awareness of abnormal movements (rate only patient's report): No Awareness, Dental Status Current problems with teeth and/or dentures?: No Does patient usually wear dentures?: No  CIWA:  CIWA-Ar Total: 1 COWS:     Musculoskeletal: Strength & Muscle Tone: within normal limits Gait & Station: normal Patient leans: N/A  Psychiatric Specialty Exam: Physical Exam Vitals and nursing note reviewed.  Constitutional:      General:  She is not in acute distress.    Appearance: Normal appearance. She is normal weight. She is not ill-appearing, toxic-appearing or diaphoretic.  HENT:     Head: Normocephalic and atraumatic.  Cardiovascular:     Rate and Rhythm: Normal rate.  Pulmonary:     Effort: Pulmonary effort is normal.  Musculoskeletal:        General: Normal range of motion.  Neurological:     General: No focal deficit present.     Mental Status: She is alert.     Review of Systems  Constitutional: Negative for fatigue and fever.  Respiratory: Negative for chest tightness and shortness of breath.   Cardiovascular: Positive for chest pain (mild from CPR). Negative for palpitations.  Gastrointestinal: Negative for abdominal pain, constipation, diarrhea, nausea and vomiting.  Neurological: Negative for dizziness, weakness, light-headedness and headaches.   Psychiatric/Behavioral: Negative for suicidal ideas.    Blood pressure 123/62, pulse (!) 56, temperature 97.7 F (36.5 C), temperature source Oral, resp. rate 16, height 5\' 9"  (1.753 m), weight 78.7 kg, SpO2 97 %.Body mass index is 25.62 kg/m.  General Appearance: Casual and in scrubs  Eye Contact:  Good  Speech:  Clear and Coherent and Normal Rate  Volume:  Normal  Mood:  Appropriate  Affect:  Appropriate  Thought Process:  Coherent and Goal Directed  Orientation:  Full (Time, Place, and Person)  Thought Content:  Logical  Suicidal Thoughts:  No  Homicidal Thoughts:  No  Memory:  Immediate;   Good Recent;   Good  Judgement:  Intact  Insight:  Present  Psychomotor Activity:  Normal  Concentration:  Concentration: Good and Attention Span: Good  Recall:  Good  Fund of Knowledge:  Good  Language:  Good  Akathisia:  No  Handed:  Right  AIMS (if indicated):     Assets:  Communication Skills Resilience Social Support  ADL's:  Intact  Cognition:  WNL  Sleep:  Number of Hours: 6.75     Treatment Plan Summary: Daily contact with patient to assess and evaluate symptoms and progress in treatment  Miss. Barnaby is a 35 yr old female who presents with MDD, Recurrent, Severe, and suicide attempt (Overdose on Lyrica and Wellbutrin). PPHx is significant for MDD, Recurrent, Severe and Bipolar.  She seems to be improving and has been tolerating the medications well. Has been attending group an interacting well on the unit. Given her low B12 will redraw tomorrow after initiating supplementation to monitor response. Will continue to monitor.   MDD, Recurrent, Severe  Suicide Attempt: -Continue Abilify 5 mg daily -Continue Zoloft 100 mg daily   Withdrawal: -Continue CIWA -Continue Ativan 1 mg q6 PRN CIWA>10   Macrocytic Anemia: -Continue Thiamine 100 mg daily -Continue folic acid 1 mg daily -Continue B12 PO 1000 mcg -Redraw B12 tomorrow AM   -ContinuePRN's: Tylenol,  Maalox, Atarax, Milk of Magnesia, Trazodone   Lauro Franklin, MD 01/14/2021, 7:01 AM

## 2021-01-14 NOTE — Progress Notes (Signed)
Pt continues to be present in the milieu and reports that she has been attending groups. Pt shared that she has developed one coping skill which is separating herself from violent situations. She denies any withdrawal symptoms and reports that she slept well last night. She said that she will be discharging tomorrow and plans to go to her boyfriends house. Pt denies SI/HI and AVH. Active listening, reassurance, and support provided. Medications administered as ordered by provider. Q 15 min safety checks continue. Pt's safety has been maintained.   01/14/21 2120  Psych Admission Type (Psych Patients Only)  Admission Status Voluntary  Psychosocial Assessment  Patient Complaints Anxiety;Depression  Eye Contact Fair  Facial Expression Flat  Affect Appropriate to circumstance;Depressed;Anxious  Speech Logical/coherent  Interaction Minimal  Motor Activity Other (Comment) (WNL)  Appearance/Hygiene Improved  Behavior Characteristics Cooperative;Appropriate to situation;Anxious  Mood Depressed;Anxious;Pleasant  Thought Process  Coherency WDL  Content WDL  Delusions None reported or observed  Perception WDL  Hallucination None reported or observed  Judgment WDL  Confusion None  Danger to Self  Current suicidal ideation? Denies  Danger to Others  Danger to Others None reported or observed

## 2021-01-14 NOTE — Progress Notes (Signed)
[facility] Group Notes:  (Nursing/MHT/Case Management/Adjunct)  Date:  01/14/2021  Time: 2015  Type of Therapy:  wrap up group  Participation Level:  Active  Participation Quality:  Appropriate, Attentive, Sharing and Supportive  Affect:  Flat  Cognitive:  Appropriate  Insight:  Improving  Engagement in Group:  Engaged  Modes of Intervention:  Clarification, Education and Support  Summary of Progress/Problems: Positive thinking and positive change were discussed.   Erika Taylor 01/14/2021, 8:35 PM

## 2021-01-15 DIAGNOSIS — T43202A Poisoning by unspecified antidepressants, intentional self-harm, initial encounter: Secondary | ICD-10-CM

## 2021-01-15 LAB — CBC WITH DIFFERENTIAL/PLATELET
Abs Immature Granulocytes: 0.05 10*3/uL (ref 0.00–0.07)
Basophils Absolute: 0.1 10*3/uL (ref 0.0–0.1)
Basophils Relative: 0 %
Eosinophils Absolute: 0.2 10*3/uL (ref 0.0–0.5)
Eosinophils Relative: 1 %
HCT: 44.5 % (ref 36.0–46.0)
Hemoglobin: 14.2 g/dL (ref 12.0–15.0)
Immature Granulocytes: 0 %
Lymphocytes Relative: 23 %
Lymphs Abs: 3 10*3/uL (ref 0.7–4.0)
MCH: 31.4 pg (ref 26.0–34.0)
MCHC: 31.9 g/dL (ref 30.0–36.0)
MCV: 98.5 fL (ref 80.0–100.0)
Monocytes Absolute: 0.5 10*3/uL (ref 0.1–1.0)
Monocytes Relative: 4 %
Neutro Abs: 9.1 10*3/uL — ABNORMAL HIGH (ref 1.7–7.7)
Neutrophils Relative %: 72 %
Platelets: 402 10*3/uL — ABNORMAL HIGH (ref 150–400)
RBC: 4.52 MIL/uL (ref 3.87–5.11)
RDW: 13.2 % (ref 11.5–15.5)
WBC: 12.9 10*3/uL — ABNORMAL HIGH (ref 4.0–10.5)
nRBC: 0 % (ref 0.0–0.2)

## 2021-01-15 LAB — COMPREHENSIVE METABOLIC PANEL
ALT: 13 U/L (ref 0–44)
AST: 15 U/L (ref 15–41)
Albumin: 4.3 g/dL (ref 3.5–5.0)
Alkaline Phosphatase: 46 U/L (ref 38–126)
Anion gap: 10 (ref 5–15)
BUN: 12 mg/dL (ref 6–20)
CO2: 23 mmol/L (ref 22–32)
Calcium: 9.8 mg/dL (ref 8.9–10.3)
Chloride: 103 mmol/L (ref 98–111)
Creatinine, Ser: 0.84 mg/dL (ref 0.44–1.00)
GFR, Estimated: >60 mL/min (ref >60)
Glucose, Bld: 107 mg/dL — ABNORMAL HIGH (ref 70–99)
Potassium: 4 mmol/L (ref 3.5–5.1)
Sodium: 136 mmol/L (ref 135–145)
Total Bilirubin: 0.6 mg/dL (ref 0.3–1.2)
Total Protein: 8.2 g/dL — ABNORMAL HIGH (ref 6.5–8.1)

## 2021-01-15 LAB — VITAMIN B12: Vitamin B-12: 285 pg/mL (ref 180–914)

## 2021-01-15 MED ORDER — SERTRALINE HCL 100 MG PO TABS: 100.0000 mg | ORAL_TABLET | Freq: Every day | ORAL | 0 refills | Status: DC

## 2021-01-15 MED ORDER — ARIPIPRAZOLE 5 MG PO TABS: 5.0000 mg | ORAL_TABLET | Freq: Every day | ORAL | 0 refills | Status: DC

## 2021-01-15 MED ORDER — TRAZODONE HCL 50 MG PO TABS
50.0000 mg | ORAL_TABLET | Freq: Every evening | ORAL | Status: DC | PRN
  Administered 2021-01-15: 50 mg via ORAL
  Filled 2021-01-15: qty 1

## 2021-01-15 MED ORDER — HYDROXYZINE HCL 25 MG PO TABS
25.0000 mg | ORAL_TABLET | Freq: Four times a day (QID) | ORAL | 0 refills | Status: DC | PRN
Start: 2021-01-15 — End: 2022-11-07

## 2021-01-15 NOTE — [facility] Group Notes (Signed)
Adult Psychoeducational Group Note  Date:  01/15/2021 Time:  9:01 AM  Group Topic/Focus:  Goals Group:   The focus of this group is to help patients establish daily goals to achieve during treatment and discuss how the patient can incorporate goal setting into their daily lives to aide in recovery.  Participation Level:  Did Not Attend Erika Taylor Erika Taylor 01/15/2021, 9:01 AM 
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
## 2021-01-15 NOTE — [facility] Suicide Risk Assessment (Signed)
[facility] Discharge Suicide Risk Assessment   Principal Problem: Overdose of antidepressant Discharge Diagnoses: Principal Problem:   Overdose of antidepressant Active Problems:   Polysubstance abuse (HCC)   Anxiety   MDD (major depressive disorder), recurrent episode, severe (HCC)   Total Time spent with patient: 20 minutes  Musculoskeletal: Strength & Muscle Tone: within normal limits Gait & Station: normal Patient leans: N/A  Psychiatric Specialty Exam: Review of Systems  Blood pressure 124/71, pulse 74, temperature 98.2 F (36.8 C), temperature source Oral, resp. rate 16, height 5\' 9"  (1.753 m), weight 78.7 kg, SpO2 97 %.Body mass index is 25.62 kg/m.  General Appearance: Casual  Eye Contact::  Fair  Speech:  Clear and Coherent  Volume:  Normal  Mood:  Euthymic  Affect:  Appropriate  Thought Process:  Coherent  Orientation:  Full (Time, Place, and Person)  Thought Content:  Logical  Suicidal Thoughts:  No  Homicidal Thoughts:  No  Memory:  Recent;   Fair  Judgement:  Intact  Insight:  Fair  Psychomotor Activity:  Normal  Concentration:  Fair  Recall:  [id number] of Knowledge:Fair  Language: Fair  Akathisia:  No  Handed:  Right  AIMS (if indicated):     Assets:  Desire for Improvement  Sleep:  Number of Hours: 2  Cognition: WNL  ADL's:  Intact   Mental Status Per Nursing Assessment::   On Admission:  Suicidal ideation indicated by patient,Self-harm behaviors  Demographic Factors:  Caucasian  Loss Factors: Financial problems/change in socioeconomic status  Historical Factors: Impulsivity  Risk Reduction Factors:   Living with another person, especially a relative, Positive social support and Positive therapeutic relationship  Continued Clinical Symptoms:  Alcohol/Substance Abuse/Dependencies  Cognitive Features That Contribute To Risk:  None    Suicide Risk:  Mild:  Suicidal ideation of limited frequency, intensity, duration, and specificity.   There are no identifiable plans, no associated intent, mild dysphoria and related symptoms, good self-control (both objective and subjective assessment), few other risk factors, and identifiable protective factors, including available and accessible social support.   Follow-up Information    [facility] [place] Follow up on 01/28/2021.   Specialty: Behavioral Health Why: You have an appointment for medication management services on 01/28/21 at 2:00 pm.  This will be a Virtual appointment.  The provider will email you with the link for this appointment.  Contact information: [address] [place] [phone]       [phone], LCAS Follow up on 01/20/2021.   Why: You have an appointment for therapy services on 01/20/21 at 11:00 am.  This will be a Virtual appointment  Contact information: [address] [place] [address]               Plan Of Care/Follow-up recommendations:  Other:  Follow-up with outpatient care  [phone], MD 01/15/2021, 11:13 AM

## 2021-01-15 NOTE — Progress Notes (Signed)
Recreation Therapy Notes  Date:  2.11.22 Time: 1005 Location: [address] Dayroom  Group Topic: Stress Management  Goal Area(s) Addresses:  Patient will identify positive stress management techniques. Patient will identify benefits of using stress management post d/c.  Intervention: Stress Management  Activity: Meditation.  LRT played a meditation on being resilient in the face of adversity.  Patients were to listen as the meditation played to engage in the activity.    Education:  Stress Management, Discharge Planning.   Education Outcome: Acknowledges Education  Clinical Observations/Feedback: Pt did not attend group session.    Caroll Rancher, LRT/CTRS    Lillia Abed, Roselyn Doby A 01/15/2021 11:10 AM

## 2021-01-15 NOTE — Progress Notes (Signed)
  [facility] Adult Case Management Discharge Plan :  Will you be returning to the same living situation after discharge:  Yes,  Home At discharge, do you have transportation home?: Yes,  Boyfriend Do you have the ability to pay for your medications: Yes,  Insurance   Release of information consent forms completed and in the chart;  Patient's signature needed at discharge.  Patient to Follow up at:  Follow-up Information    [facility] [place] Follow up on 01/28/2021.   Specialty: Behavioral Health Why: You have an appointment for medication management services on 01/28/21 at 2:00 pm.  This will be a Virtual appointment.  The provider will email you with the link for this appointment.  Contact information: [address] [place] [address] [phone]       Rockney Ghee, LCAS Follow up on 01/20/2021.   Why: You have an appointment for therapy services on 01/20/21 at 11:00 am.  This will be a Virtual appointment  Contact information: [address] [place] [address] [phone]               Next level of care provider has access to [facility] Link:yes  Safety Planning and Suicide Prevention discussed: Yes,  Boyfriend and patient  Have you used any form of tobacco in the last 30 days? (Cigarettes, Smokeless Tobacco, Cigars, and/or Pipes): Yes  Has patient been referred to the Quitline?: Patient refused referral  Patient has been referred for addiction treatment: N/A  Aram Beecham, LCSWA 01/15/2021, 9:21 AM

## 2021-01-15 NOTE — Plan of Care (Signed)
Nurse discussed coping skills with patient.  

## 2021-01-28 ENCOUNTER — Telehealth (HOSPITAL_COMMUNITY): Payer: BC Managed Care – PPO | Admitting: Psychiatry

## 2021-01-29 ENCOUNTER — Emergency Department (HOSPITAL_BASED_OUTPATIENT_CLINIC_OR_DEPARTMENT_OTHER)
Admission: EM | Admit: 2021-01-29 | Discharge: 2021-01-29 | Discharge status: Against Medical Advice | Payer: BC Managed Care – PPO | Attending: Emergency Medicine | Admitting: Emergency Medicine

## 2021-01-29 ENCOUNTER — Other Ambulatory Visit: Payer: Self-pay

## 2021-01-29 ENCOUNTER — Encounter (HOSPITAL_BASED_OUTPATIENT_CLINIC_OR_DEPARTMENT_OTHER): Payer: Self-pay

## 2021-01-29 DIAGNOSIS — T402X1A Poisoning by other opioids, accidental (unintentional), initial encounter: Secondary | ICD-10-CM | POA: Insufficient documentation

## 2021-01-29 DIAGNOSIS — T40601A Poisoning by unspecified narcotics, accidental (unintentional), initial encounter: Secondary | ICD-10-CM

## 2021-01-29 DIAGNOSIS — Z87891 Personal history of nicotine dependence: Secondary | ICD-10-CM | POA: Diagnosis not present

## 2021-01-29 DIAGNOSIS — T401X1A Poisoning by heroin, accidental (unintentional), initial encounter: Secondary | ICD-10-CM

## 2021-01-29 MED ORDER — NALOXONE HCL 0.4 MG/ML IJ SOLN
INTRAMUSCULAR | Status: AC
  Filled 2021-01-29: qty 2

## 2021-01-29 MED ORDER — NALOXONE HCL 0.4 MG/ML IJ SOLN
0.4000 mg | Freq: Once | INTRAMUSCULAR | Status: AC
  Administered 2021-01-29: 0.4 mg via INTRAVENOUS

## 2021-01-29 MED ORDER — NALOXONE HCL 4 MG/0.1ML NA LIQD
1.0000 | Freq: Once | NASAL | Status: AC
  Administered 2021-01-29: 1 via NASAL
  Filled 2021-01-29: qty 4

## 2021-01-29 MED ORDER — SODIUM CHLORIDE 0.9 % IV BOLUS
1000.0000 mL | Freq: Once | INTRAVENOUS | Status: AC
  Administered 2021-01-29: 1000 mL via INTRAVENOUS

## 2021-01-29 MED ORDER — NALOXONE HCL 4 MG/0.1ML NA LIQD: 1.0000 | Freq: Two times a day (BID) | NASAL | Status: DC | PRN

## 2021-01-29 MED ORDER — NALOXONE HCL 4 MG/0.1ML NA LIQD: 1.0000 | Freq: Once | NASAL | 0 refills | Status: AC | PRN
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
## 2021-01-29 NOTE — ED Notes (Signed)
Witnessed patient remove her IV.

## 2021-01-29 NOTE — Discharge Instructions (Signed)
Your history and exam today are consistent with a heroin or other narcotic overdose today.  As we discussed, if there was fentanyl involved, it could cause recurrent unresponsiveness and respiratory failure.  You will be leaving AGAINST MEDICAL ADVICE as we want to watch you longer than you have been here.  Your oxygen improved.  Please take the prescription and single use of the Narcan and be careful.  Please use some of the resources to follow-up with substance counseling.  If any symptoms change or worsen, please return to the nearest emergency department.

## 2021-01-29 NOTE — ED Triage Notes (Signed)
Pt now arousable after Narcan, reports that she used Heroin today unsure of amount.

## 2021-01-29 NOTE — ED Notes (Signed)
Pt removed IV on her own and left AMA. Unwilling to sign. VS updated. Pt ambulatory this RN followed patient to car with AMA paperwork with resources for treatment. Pt also given nasal home narcan and Rx for Narcan. Pt got in car with a friend and driven off of property.

## 2021-01-29 NOTE — ED Notes (Signed)
Patient presented with agonal breathing prior to narcan. Placed on NRB until narcan was given. Suction set up. Patient not awake and oriented, breathing WNL. Placed on 2LNC, SAT 100%
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
## 2021-02-15 ENCOUNTER — Ambulatory Visit: Payer: Medicaid Other

## 2021-03-11 ENCOUNTER — Encounter: Payer: Self-pay | Admitting: General Practice

## 2021-05-27 ENCOUNTER — Other Ambulatory Visit: Payer: Self-pay

## 2021-05-27 ENCOUNTER — Emergency Department (HOSPITAL_COMMUNITY)
Admission: EM | Admit: 2021-05-27 | Discharge: 2021-05-27 | Disposition: A | Discharge status: Home / Self Care | Payer: Medicaid Other | Attending: Emergency Medicine | Admitting: Emergency Medicine

## 2021-05-27 DIAGNOSIS — S99919A Unspecified injury of unspecified ankle, initial encounter: Secondary | ICD-10-CM

## 2021-05-27 DIAGNOSIS — X58XXXA Exposure to other specified factors, initial encounter: Secondary | ICD-10-CM | POA: Diagnosis not present

## 2021-05-27 DIAGNOSIS — Z5320 Procedure and treatment not carried out because of patient's decision for unspecified reasons: Secondary | ICD-10-CM | POA: Insufficient documentation

## 2021-05-27 DIAGNOSIS — Z5321 Procedure and treatment not carried out due to patient leaving prior to being seen by health care provider: Secondary | ICD-10-CM | POA: Insufficient documentation

## 2021-05-27 DIAGNOSIS — S99911A Unspecified injury of right ankle, initial encounter: Secondary | ICD-10-CM | POA: Insufficient documentation

## 2021-05-27 NOTE — ED Provider Notes (Cosign Needed)
Emergency Medicine Provider Triage Evaluation Note  Erika Taylor , a 35 y.o. female  was evaluated in triage.  Pt complains of injury to the right ankle. When I attempt to evaluate her she is agitated, she is concerned about her family member, tells me "I don't need to get seen for it, I will wrap my ankle on my own and see my PCP tomorrow."   Review of Systems  Positive: Ankle pain Negative: wound  Physical Exam  BP 122/88 (BP Location: Right Arm)   Pulse 80   Temp 98.4 F (36.9 C) (Oral)   Resp 16   SpO2 98%  Gen:   Awake, agitated, pacing Resp:  Normal effort  MSK:   Moves extremities without difficulty, slight limp however ambulates with out difficulty.  Other:             Declines physical exam.   Medical Decision Making  Medically screening exam initiated at 4:10 PM.  Appropriate orders placed.  Sheng Pritz was informed that the remainder of the evaluation will be completed by another provider, this initial triage assessment does not replace that evaluation, and the importance of remaining in the ED until their evaluation is complete.  Patient stated that she does not want to be evaluated.  She is on the phone with her family member who is reportedly also here, she states that her needs are secondary and that she will see her PCP tomorrow. Advised her that my medical recommendation would be for her to allow me to examine her ankle and consideration of x-rays or further imaging based on that exam.  She declined this stating that she did not wish for this exam and evaluation.    Cristina Gong, [place] 05/27/21 1616

## 2021-05-27 NOTE — ED Triage Notes (Signed)
Pt refusing to be seen  

## 2021-06-02 ENCOUNTER — Ambulatory Visit (INDEPENDENT_AMBULATORY_CARE_PROVIDER_SITE_OTHER): Payer: Medicaid Other

## 2021-06-02 ENCOUNTER — Other Ambulatory Visit: Payer: Self-pay

## 2021-06-02 ENCOUNTER — Ambulatory Visit (HOSPITAL_COMMUNITY)
Admission: EM | Admit: 2021-06-02 | Discharge: 2021-06-02 | Disposition: A | Discharge status: Home / Self Care | Payer: Medicaid Other | Attending: Family Medicine | Admitting: Family Medicine

## 2021-06-02 ENCOUNTER — Encounter (HOSPITAL_COMMUNITY): Payer: Self-pay

## 2021-06-02 DIAGNOSIS — R062 Wheezing: Secondary | ICD-10-CM | POA: Diagnosis present

## 2021-06-02 DIAGNOSIS — F17210 Nicotine dependence, cigarettes, uncomplicated: Secondary | ICD-10-CM | POA: Diagnosis not present

## 2021-06-02 DIAGNOSIS — R5383 Other fatigue: Secondary | ICD-10-CM | POA: Diagnosis not present

## 2021-06-02 DIAGNOSIS — R053 Chronic cough: Secondary | ICD-10-CM | POA: Insufficient documentation

## 2021-06-02 DIAGNOSIS — R059 Cough, unspecified: Secondary | ICD-10-CM | POA: Diagnosis not present

## 2021-06-02 DIAGNOSIS — R079 Chest pain, unspecified: Secondary | ICD-10-CM | POA: Diagnosis not present

## 2021-06-02 DIAGNOSIS — Z113 Encounter for screening for infections with a predominantly sexual mode of transmission: Secondary | ICD-10-CM | POA: Diagnosis present

## 2021-06-02 LAB — CBC WITH DIFFERENTIAL/PLATELET
Abs Immature Granulocytes: 0.04 10*3/uL (ref 0.00–0.07)
Basophils Absolute: 0 10*3/uL (ref 0.0–0.1)
Basophils Relative: 0 %
Eosinophils Absolute: 0.4 10*3/uL (ref 0.0–0.5)
Eosinophils Relative: 3 %
HCT: 44.3 % (ref 36.0–46.0)
Hemoglobin: 14.2 g/dL (ref 12.0–15.0)
Immature Granulocytes: 0 %
Lymphocytes Relative: 19 %
Lymphs Abs: 2 10*3/uL (ref 0.7–4.0)
MCH: 31.9 pg (ref 26.0–34.0)
MCHC: 32.1 g/dL (ref 30.0–36.0)
MCV: 99.6 fL (ref 80.0–100.0)
Monocytes Absolute: 0.8 10*3/uL (ref 0.1–1.0)
Monocytes Relative: 7 %
Neutro Abs: 7.8 10*3/uL — ABNORMAL HIGH (ref 1.7–7.7)
Neutrophils Relative %: 71 %
Platelets: 297 10*3/uL (ref 150–400)
RBC: 4.45 MIL/uL (ref 3.87–5.11)
RDW: 13.2 % (ref 11.5–15.5)
WBC: 11 10*3/uL — ABNORMAL HIGH (ref 4.0–10.5)
nRBC: 0 % (ref 0.0–0.2)

## 2021-06-02 LAB — POCT URINALYSIS DIPSTICK, ED / UC
Bilirubin Urine: NEGATIVE
Glucose, UA: NEGATIVE mg/dL
Hgb urine dipstick: NEGATIVE
Ketones, ur: NEGATIVE mg/dL
Leukocytes,Ua: NEGATIVE
Nitrite: POSITIVE — AB
Protein, ur: NEGATIVE mg/dL
Specific Gravity, Urine: >=1.03 (ref 1.005–1.030)
Urobilinogen, UA: 0.2 mg/dL (ref 0.0–1.0)
pH: 5 (ref 5.0–8.0)

## 2021-06-02 LAB — COMPREHENSIVE METABOLIC PANEL
ALT: 21 U/L (ref 0–44)
AST: 31 U/L (ref 15–41)
Albumin: 3.5 g/dL (ref 3.5–5.0)
Alkaline Phosphatase: 45 U/L (ref 38–126)
Anion gap: 7 (ref 5–15)
BUN: 9 mg/dL (ref 6–20)
CO2: 24 mmol/L (ref 22–32)
Calcium: 9.2 mg/dL (ref 8.9–10.3)
Chloride: 105 mmol/L (ref 98–111)
Creatinine, Ser: 0.8 mg/dL (ref 0.44–1.00)
GFR, Estimated: >60 mL/min (ref >60)
Glucose, Bld: 105 mg/dL — ABNORMAL HIGH (ref 70–99)
Potassium: 4.5 mmol/L (ref 3.5–5.1)
Sodium: 136 mmol/L (ref 135–145)
Total Bilirubin: 0.4 mg/dL (ref 0.3–1.2)
Total Protein: 6.4 g/dL — ABNORMAL LOW (ref 6.5–8.1)

## 2021-06-02 LAB — POC URINE PREG, ED: Preg Test, Ur: NEGATIVE

## 2021-06-02 LAB — TSH: TSH: 1.435 u[IU]/mL (ref 0.350–4.500)

## 2021-06-02 MED ORDER — AZITHROMYCIN 250 MG PO TABS: 250.0000 mg | ORAL_TABLET | Freq: Every day | ORAL | 0 refills | Status: DC

## 2021-06-02 MED ORDER — PREDNISONE 20 MG PO TABS
40.0000 mg | ORAL_TABLET | Freq: Every day | ORAL | 0 refills | Status: DC
Start: 2021-06-02 — End: 2022-11-07

## 2021-06-02 NOTE — Discharge Instructions (Addendum)
We have sent testing for sexually transmitted infections. We will notify you of any positive results once they are received. If required, we will prescribe any medications you might need.  Please refrain from all sexual activity for at least the next seven days.  You have had labs (blood work) drawn today. We will call you with any significant abnormalities or if there is need to begin or change treatment or pursue further follow up.  You may also review your test results online through MyChart. If you do not have a MyChart account, instructions to sign up should be on your discharge paperwork.  

## 2021-06-02 NOTE — ED Provider Notes (Signed)
[facility] CARE CENTER   [id number] 06/02/21 Arrival Time: 0943  ASSESSMENT & PLAN:  1. Persistent cough for 3 weeks or longer   2. Fatigue, unspecified type   3. Cigarette smoker   4. Wheezing   5. Screening for STDs (sexually transmitted diseases)    Pending: Labs Reviewed  CBC WITH DIFFERENTIAL/PLATELET -   COMPREHENSIVE METABOLIC PANEL - Abnormal; Notable for the following components:  URINE CULTURE  TSH  CERVICOVAGINAL ANCILLARY ONLY    I have personally viewed the imaging studies ordered this visit. Normal CXR.  Given duration of symptoms will cover for atypical. Begin: Meds ordered this encounter  Medications   azithromycin (ZITHROMAX) 250 MG tablet    Sig: Take 1 tablet (250 mg total) by mouth daily. Take first 2 tablets together, then 1 every day until finished.    Dispense:  6 tablet    Refill:  0   predniSONE (DELTASONE) 20 MG tablet    Sig: Take 2 tablets (40 mg total) by mouth daily.    Dispense:  10 tablet    Refill:  0   Three plus minutes of smoking cessation counseling done. Patient co-morbidities include h/o polysubstance abuse. Risks of smoking reviewed including possible lung disease (COPD, cancer) and cardiac disease.  Benefits of smoking cessation also presented. Currently in pre-contemplative stage with no desire to quit at this time. Encourage to think about this. Also encouraged follow-up with a primary care provider should she desire to quit.    Discharge Instructions      We have sent testing for sexually transmitted infections. We will notify you of any positive results once they are received. If required, we will prescribe any medications you might need.  Please refrain from all sexual activity for at least the next seven days.  You have had labs (blood work) drawn today. We will call you with any significant abnormalities or if there is need to begin or change treatment or pursue further follow up.  You may also review your test  results online through MyChart. If you do not have a MyChart account, instructions to sign up should be on your discharge paperwork.     Follow-up Information     Mayer Masker, PA-C .   Specialty: Physician Assistant Why: As needed. Contact information: [address] [place] [address] [phone]         [facility] .   Specialty: Urgent Care Why: If worsening or failing to improve as anticipated. Contact information: [address] [place] [address] [phone]                Reviewed expectations re: course of current medical issues. Questions answered. Outlined signs and symptoms indicating need for more acute intervention. Patient verbalized understanding. After Visit Summary given.   SUBJECTIVE:  History from: patient. Erika Taylor is a 35 y.o. female who presents with complaint of overall fatigue and cough; x 3 weeks. Unsure if wheezing at time. Mild chest soreness from coughing. No fever reported. Smokes cigarettes but has cut down recently. Requests lab work secondary to h/o "low blood counts". Normal PO intake without n/v/d. Ambulatory without difficulty. No urinary symptoms. No abd or back pain. No LMP recorded (within weeks). Street drug use: denied.  Requests STD screening. No symptoms.  Social History   Tobacco Use  Smoking Status Some Days   Packs/day: 0.50   Years: 12.00   Pack years: 6.00   Types: Cigarettes  Start date: 09/14/2015  Smokeless Tobacco Never   Social History   Substance and Sexual Activity  Alcohol Use Yes   Alcohol/week: 0.0 standard drinks   Comment: social     OBJECTIVE:  Vitals:   06/02/21 1008  BP: 114/71  Pulse: (!) 59  Resp: 18  Temp: 97.8 F (36.6 C)  TempSrc: Oral  SpO2: 93%    General appearance: alert, oriented, no acute distress Eyes: PERRLA; EOMI; conjunctivae normal HENT: normocephalic; atraumatic Neck: supple with  FROM Lungs: without labored respirations; speaks full sentences without difficulty;  mod bilateral exp wheezes at bases Heart: regular rate and rhythm Chest Wall: without tenderness to palpation Abdomen: soft, non-tender; no guarding or rebound tenderness Extremities: without edema; without calf swelling or tenderness; symmetrical without gross deformities Skin: warm and dry; without rash or lesions Neuro: normal gait Psychological: alert and cooperative; normal mood and affect  Labs: Results for orders placed or performed during the hospital encounter of 06/02/21  POC Urinalysis dipstick  Result Value Ref Range   Glucose, UA NEGATIVE NEGATIVE mg/dL   Bilirubin Urine NEGATIVE NEGATIVE   Ketones, ur NEGATIVE NEGATIVE mg/dL   Specific Gravity, Urine >=1.030 1.005 - 1.030   Hgb urine dipstick NEGATIVE NEGATIVE   pH 5.0 5.0 - 8.0   Protein, ur NEGATIVE NEGATIVE mg/dL   Urobilinogen, UA 0.2 0.0 - 1.0 mg/dL   Nitrite POSITIVE (A) NEGATIVE   Leukocytes,Ua NEGATIVE NEGATIVE  POC urine pregnancy  Result Value Ref Range   Preg Test, Ur NEGATIVE NEGATIVE    Imaging: DG Chest 2 View  Result Date: 06/02/2021 CLINICAL DATA:  Cough and chest tightness.  Shortness of breath. EXAM: CHEST - 2 VIEW COMPARISON:  January 11, 2021 FINDINGS: Lungs are clear. Heart size and pulmonary vascularity are normal. No adenopathy. No pneumothorax. No bone lesions. IMPRESSION: Lungs clear.  Heart size normal. Electronically Signed   By: Bretta Bang III M.D.   On: 06/02/2021 10:33     Allergies  Allergen Reactions   Penicillins Rash    Past Medical History:  Diagnosis Date   Depression    Social History   Socioeconomic History   Marital status: Single    Spouse name: Not on file   Number of children: 1   Years of education: 14   Highest education level: Associate degree: academic program  Occupational History   Not on file  Tobacco Use   Smoking status: Some Days    Packs/day: 0.50     Years: 12.00    Pack years: 6.00    Types: Cigarettes    Start date: 09/14/2015   Smokeless tobacco: Never  Vaping Use   Vaping Use: Never used  Substance and Sexual Activity   Alcohol use: Yes    Alcohol/week: 0.0 standard drinks    Comment: social   Drug use: Not Currently    Types: Cocaine, Benzodiazepines, Barbituates, Marijuana    Comment: heroin   Sexual activity: Yes    Birth control/protection: None  Other Topics Concern   Not on file  Social History Narrative   Not on file   Social Determinants of Health   Financial Resource Strain: Not on file  Food Insecurity: No Food Insecurity   Worried About Programme researcher, broadcasting/film/video in the Last Year: Never true   Ran Out of Food in the Last Year: Never true  Transportation Needs: No Transportation Needs   Lack of Transportation (Medical): No   Lack of Transportation (Non-Medical): No  Physical Activity:  Not on file  Stress: Not on file  Social Connections: Not on file  Intimate Partner Violence: Not on file   Family History  Adopted: Yes  Problem Relation Age of Onset   Healthy Mother    Healthy Father    History reviewed. No pertinent surgical history.    Mardella Layman, MD 06/02/21 1228

## 2021-06-02 NOTE — ED Triage Notes (Signed)
Pt reports cough, shortness of breath  and chest tightness x 2-3 weeks. States cough is worse the past 3 days. Mucinex gives some relief.   Pt requested STD's and pregnancy test. Reports her partner told her he has Hepatitis C.   Pt wants a "rainbow test" t check her white and red blood cells.

## 2021-06-03 LAB — CERVICOVAGINAL ANCILLARY ONLY
Bacterial Vaginitis (gardnerella): POSITIVE — AB
Candida Glabrata: POSITIVE — AB
Candida Vaginitis: NEGATIVE
Chlamydia: NEGATIVE
Comment: NEGATIVE
Comment: NEGATIVE
Comment: NEGATIVE
Comment: NEGATIVE
Comment: NEGATIVE
Comment: NORMAL
Neisseria Gonorrhea: NEGATIVE
Trichomonas: NEGATIVE

## 2021-06-04 ENCOUNTER — Telehealth (HOSPITAL_COMMUNITY): Payer: Self-pay | Admitting: Internal Medicine

## 2021-06-04 ENCOUNTER — Telehealth (HOSPITAL_COMMUNITY): Payer: Self-pay | Admitting: Emergency Medicine

## 2021-06-04 LAB — URINE CULTURE: Culture: >=100000 — AB

## 2021-06-04 MED ORDER — SULFAMETHOXAZOLE-TRIMETHOPRIM 800-160 MG PO TABS
1.0000 | ORAL_TABLET | Freq: Two times a day (BID) | ORAL | 0 refills | Status: DC
Start: 2021-06-04 — End: 2021-06-04

## 2021-06-04 MED ORDER — FLUCONAZOLE 150 MG PO TABS: 150.0000 mg | ORAL_TABLET | Freq: Once | ORAL | 0 refills | Status: AC

## 2021-06-04 MED ORDER — FLUCONAZOLE 150 MG PO TABS
150.0000 mg | ORAL_TABLET | Freq: Once | ORAL | 0 refills | Status: DC
Start: 2021-06-04 — End: 2021-06-04

## 2021-06-04 MED ORDER — METRONIDAZOLE 500 MG PO TABS: 500.0000 mg | ORAL_TABLET | Freq: Two times a day (BID) | ORAL | 0 refills | Status: DC

## 2021-06-04 MED ORDER — SULFAMETHOXAZOLE-TRIMETHOPRIM 800-160 MG PO TABS: 1.0000 | ORAL_TABLET | Freq: Two times a day (BID) | ORAL | 0 refills | Status: AC

## 2021-12-09 ENCOUNTER — Emergency Department (HOSPITAL_COMMUNITY)
Admission: EM | Admit: 2021-12-09 | Discharge: 2021-12-09 | Disposition: A | Discharge status: Home / Self Care | Payer: Medicaid Other | Attending: Emergency Medicine | Admitting: Emergency Medicine

## 2021-12-09 ENCOUNTER — Emergency Department (HOSPITAL_COMMUNITY): Payer: Medicaid Other

## 2021-12-09 ENCOUNTER — Other Ambulatory Visit: Payer: Self-pay

## 2021-12-09 ENCOUNTER — Encounter (HOSPITAL_COMMUNITY): Payer: Self-pay

## 2021-12-09 DIAGNOSIS — S0012XA Contusion of left eyelid and periocular area, initial encounter: Secondary | ICD-10-CM | POA: Diagnosis not present

## 2021-12-09 DIAGNOSIS — M549 Dorsalgia, unspecified: Secondary | ICD-10-CM | POA: Diagnosis not present

## 2021-12-09 DIAGNOSIS — R519 Headache, unspecified: Secondary | ICD-10-CM | POA: Insufficient documentation

## 2021-12-09 DIAGNOSIS — M542 Cervicalgia: Secondary | ICD-10-CM | POA: Insufficient documentation

## 2021-12-09 DIAGNOSIS — Y92410 Unspecified street and highway as the place of occurrence of the external cause: Secondary | ICD-10-CM | POA: Diagnosis not present

## 2021-12-09 DIAGNOSIS — S0592XA Unspecified injury of left eye and orbit, initial encounter: Secondary | ICD-10-CM | POA: Diagnosis present
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
## 2021-12-09 NOTE — ED Notes (Signed)
I provided reinforced discharge education based off of discharge instructions. Pt acknowledged and understood my education. Pt had no further questions/concerns for provider/myself.  °
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
## 2021-12-09 NOTE — ED Notes (Signed)
[employer] officers with pt.
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
## 2021-12-09 NOTE — ED Notes (Signed)
Pt refused to provide a urine sample.

## 2021-12-30 ENCOUNTER — Emergency Department (HOSPITAL_COMMUNITY)
Admission: EM | Admit: 2021-12-30 | Discharge: 2021-12-30 | Disposition: A | Discharge status: Home / Self Care | Payer: Medicaid Other | Attending: Emergency Medicine | Admitting: Emergency Medicine

## 2021-12-30 ENCOUNTER — Other Ambulatory Visit: Payer: Self-pay

## 2021-12-30 ENCOUNTER — Encounter (HOSPITAL_COMMUNITY): Payer: Self-pay

## 2021-12-30 DIAGNOSIS — N3000 Acute cystitis without hematuria: Secondary | ICD-10-CM | POA: Diagnosis not present

## 2021-12-30 DIAGNOSIS — N938 Other specified abnormal uterine and vaginal bleeding: Secondary | ICD-10-CM | POA: Diagnosis not present

## 2021-12-30 DIAGNOSIS — R1030 Lower abdominal pain, unspecified: Secondary | ICD-10-CM | POA: Diagnosis present

## 2021-12-30 LAB — URINALYSIS, ROUTINE W REFLEX MICROSCOPIC
Bilirubin Urine: NEGATIVE
Glucose, UA: NEGATIVE mg/dL
Ketones, ur: NEGATIVE mg/dL
Nitrite: NEGATIVE
Protein, ur: 100 mg/dL — AB
Specific Gravity, Urine: 1.01 (ref 1.005–1.030)
pH: 6 (ref 5.0–8.0)

## 2021-12-30 LAB — URINALYSIS, MICROSCOPIC (REFLEX)
RBC / HPF: >50 RBC/hpf (ref 0–5)
WBC, UA: >50 WBC/hpf (ref 0–5)

## 2021-12-30 LAB — CBC WITH DIFFERENTIAL/PLATELET
Abs Immature Granulocytes: 0.02 10*3/uL (ref 0.00–0.07)
Basophils Absolute: 0 10*3/uL (ref 0.0–0.1)
Basophils Relative: 0 %
Eosinophils Absolute: 0.1 10*3/uL (ref 0.0–0.5)
Eosinophils Relative: 1 %
HCT: 40.3 % (ref 36.0–46.0)
Hemoglobin: 13.1 g/dL (ref 12.0–15.0)
Immature Granulocytes: 0 %
Lymphocytes Relative: 23 %
Lymphs Abs: 2.4 10*3/uL (ref 0.7–4.0)
MCH: 31.5 pg (ref 26.0–34.0)
MCHC: 32.5 g/dL (ref 30.0–36.0)
MCV: 96.9 fL (ref 80.0–100.0)
Monocytes Absolute: 0.5 10*3/uL (ref 0.1–1.0)
Monocytes Relative: 5 %
Neutro Abs: 7.5 10*3/uL (ref 1.7–7.7)
Neutrophils Relative %: 71 %
Platelets: 380 10*3/uL (ref 150–400)
RBC: 4.16 MIL/uL (ref 3.87–5.11)
RDW: 13.2 % (ref 11.5–15.5)
WBC: 10.5 10*3/uL (ref 4.0–10.5)
nRBC: 0 % (ref 0.0–0.2)

## 2021-12-30 LAB — WET PREP, GENITAL
Clue Cells Wet Prep HPF POC: NONE SEEN
Sperm: NONE SEEN
Trich, Wet Prep: NONE SEEN
WBC, Wet Prep HPF POC: <10 (ref <10)
Yeast Wet Prep HPF POC: NONE SEEN

## 2021-12-30 LAB — COMPREHENSIVE METABOLIC PANEL
ALT: 18 U/L (ref 0–44)
AST: 18 U/L (ref 15–41)
Albumin: 3.7 g/dL (ref 3.5–5.0)
Alkaline Phosphatase: 43 U/L (ref 38–126)
Anion gap: 7 (ref 5–15)
BUN: 10 mg/dL (ref 6–20)
CO2: 28 mmol/L (ref 22–32)
Calcium: 9.5 mg/dL (ref 8.9–10.3)
Chloride: 105 mmol/L (ref 98–111)
Creatinine, Ser: 0.75 mg/dL (ref 0.44–1.00)
GFR, Estimated: >60 mL/min (ref >60)
Glucose, Bld: 99 mg/dL (ref 70–99)
Potassium: 4.1 mmol/L (ref 3.5–5.1)
Sodium: 140 mmol/L (ref 135–145)
Total Bilirubin: 0.4 mg/dL (ref 0.3–1.2)
Total Protein: 7 g/dL (ref 6.5–8.1)

## 2021-12-30 LAB — I-STAT BETA HCG BLOOD, ED (MC, WL, AP ONLY): I-stat hCG, quantitative: <5 m[IU]/mL (ref <5)

## 2021-12-30 LAB — LIPASE, BLOOD: Lipase: 34 U/L (ref 11–51)

## 2021-12-30 MED ORDER — CEPHALEXIN 500 MG PO CAPS: 500.0000 mg | ORAL_CAPSULE | Freq: Four times a day (QID) | ORAL | 0 refills | Status: DC

## 2021-12-30 MED ORDER — IBUPROFEN 400 MG PO TABS
600.0000 mg | ORAL_TABLET | Freq: Once | ORAL | Status: AC
  Administered 2021-12-30: 600 mg via ORAL
  Filled 2021-12-30: qty 1

## 2021-12-30 MED ORDER — CEPHALEXIN 250 MG PO CAPS
500.0000 mg | ORAL_CAPSULE | Freq: Once | ORAL | Status: AC
  Administered 2021-12-30: 500 mg via ORAL
  Filled 2021-12-30: qty 2
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
## 2021-12-30 NOTE — ED Notes (Signed)
Discharge instructions reviewed with patient. Patient verbalized understanding of instructions. Follow-up care and medications were reviewed. Patient ambulatory with steady gait. VSS upon discharge.  ?

## 2021-12-30 NOTE — ED Provider Triage Note (Signed)
Emergency Medicine Provider Triage Evaluation Note  Erika Taylor , a 36 y.o. female  was evaluated in triage.  Pt complains of vaginal bleeding onset 2 days ago.  She notes that she went to medical today and at that time had a negative pregnancy test.  She notes her last period was 2 months ago.  She used to have an IUD in place however she no longer has.  She notes that her periods when she has them are typically at the beginning of the month.  She has associated dizziness, abdominal cramping, resolved nausea, subjective fever, hematuria, chills.  Has not tried medication for symptoms.  Denies dysuria, vomiting.  Review of Systems  Positive: As per HPI above. Negative: Dizziness, vomiting  Physical Exam  BP 113/77 (BP Location: Right Arm)    Pulse 81    Temp 97.8 F (36.6 C) (Oral)    Resp 17    Ht 5\' 9"  (1.753 m)    Wt 70.8 kg    SpO2 97%    BMI 23.04 kg/m  Gen:   Awake, no distress   Resp:  Normal effort  MSK:   Moves extremities without difficulty  Other:  Diffuse abdominal tenderness to palpation.  Medical Decision Making  Medically screening exam initiated at 2:16 PM.  Appropriate orders placed.  Erika Taylor was informed that the remainder of the evaluation will be completed by another provider, this initial triage assessment does not replace that evaluation, and the importance of remaining in the ED until their evaluation is complete.   Erika Taylor A, PA-C 12/30/21 1421
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
## 2021-12-31 LAB — GC/CHLAMYDIA PROBE AMP (~~LOC~~) NOT AT ARMC
Chlamydia: NEGATIVE
Comment: NEGATIVE
Comment: NORMAL
Neisseria Gonorrhea: NEGATIVE

## 2022-01-13 ENCOUNTER — Encounter: Payer: Medicaid Other | Admitting: Obstetrics

## 2022-05-06 IMAGING — CT CT CERVICAL SPINE W/O CM
3 of 4 series · 9 of 35 positions shown, 10 images · non-contrast
Comparison: September 14, 2018

CLINICAL DATA: Head trauma, abnormal mental status (Age 18-64y);
Neck trauma, intoxicated or obtunded (Age >= 16y)

EXAM:
CT HEAD WITHOUT CONTRAST
CT MAXILLOFACIAL WITHOUT CONTRAST
CT CERVICAL SPINE WITHOUT CONTRAST
TECHNIQUE: Multidetector CT imaging of the head, cervical spine, and
maxillofacial structures were performed using the standard protocol
without intravenous contrast. Multiplanar CT image reconstructions
of the cervical spine and maxillofacial structures were also
generated.

[Series 6: orthogonal axials · axial · 0.18mm/px · z∈[-177,-177]mm · 1 of 101 slices shown, 2 images]
[im 58/101  soft-tissue]
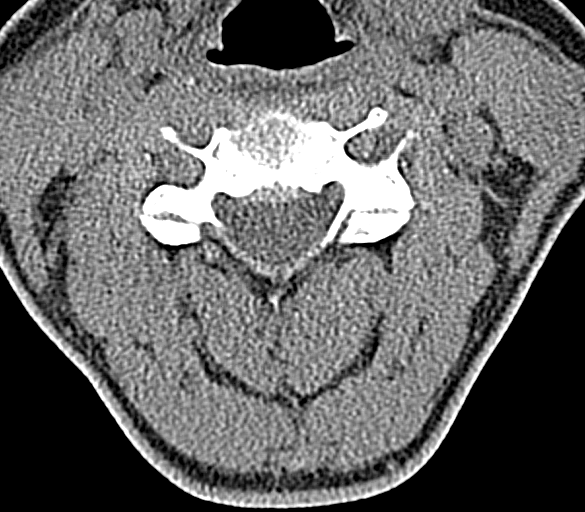
[im 58/101  bone]
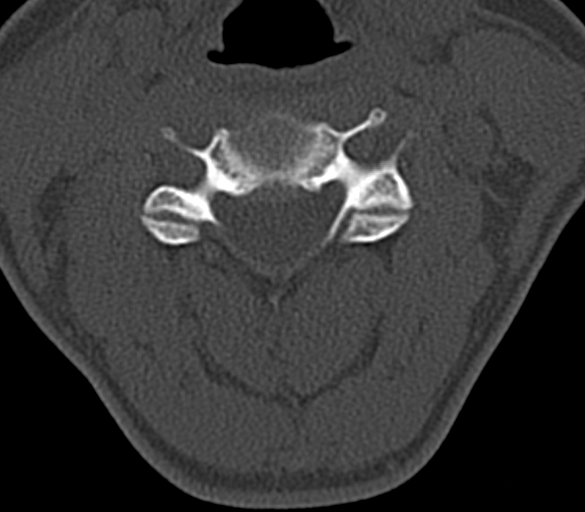

[Series 7: coronal bone · coronal · 0.20mm/px · 3 of 46 slices shown]
[im 10/46  bone]
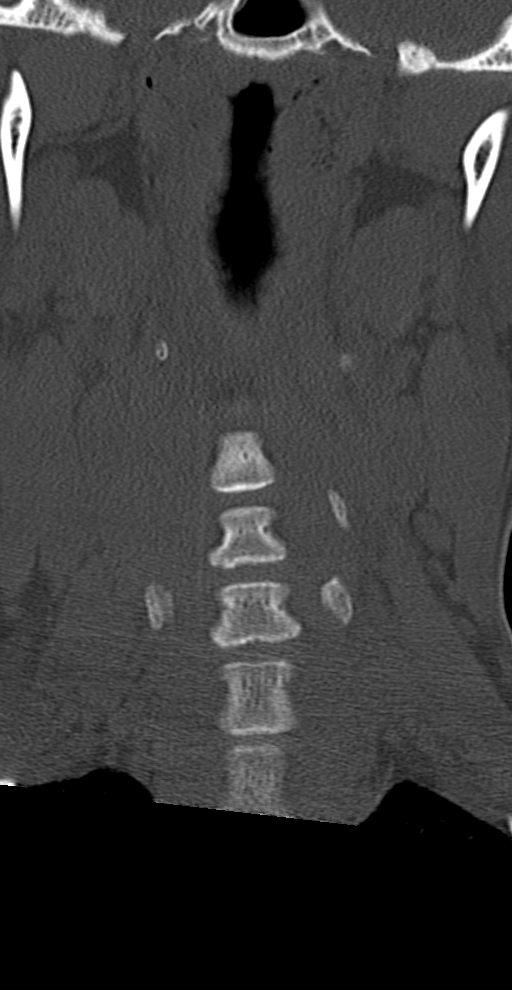
[im 19/46  bone]
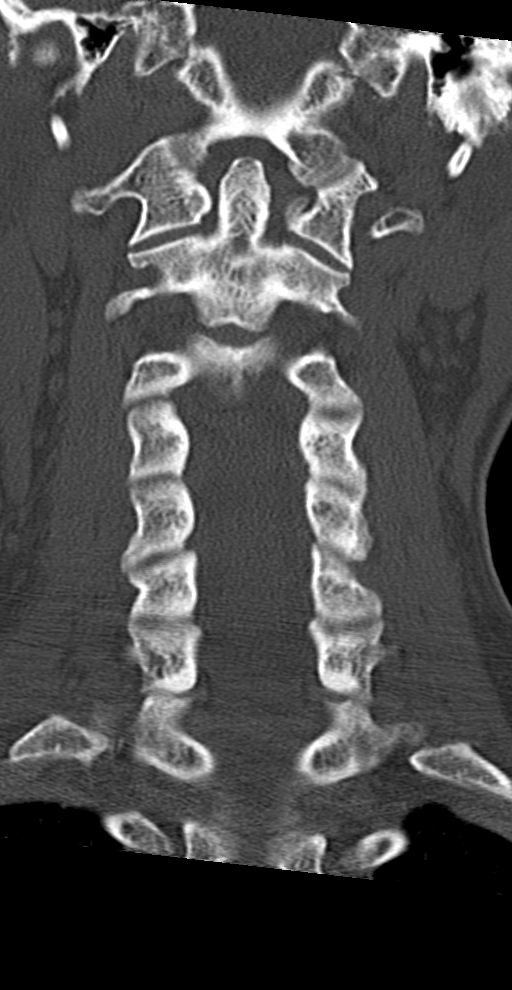
[im 28/46  bone]
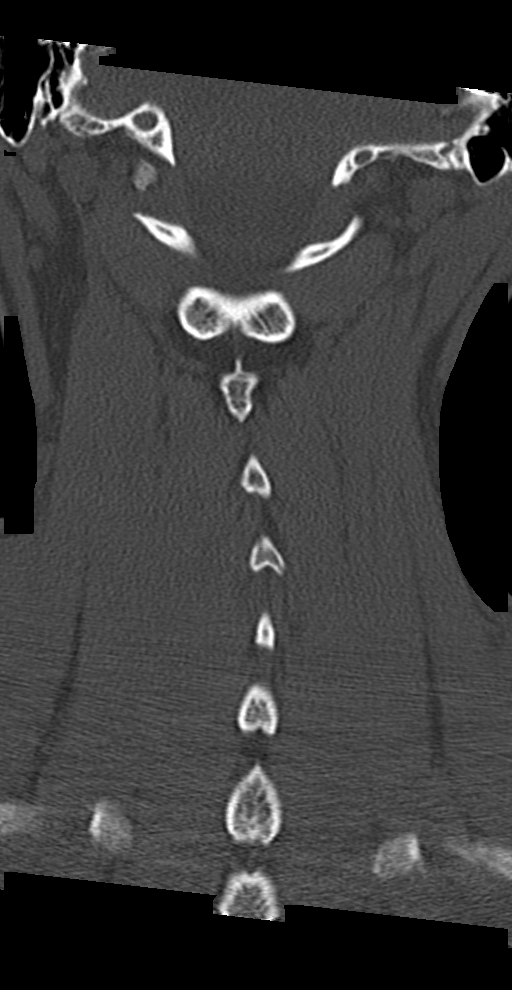

[Series 8: sagittal bone · sagittal · 0.18mm/px · 5 of 53 slices shown]
[im 18/53  bone]
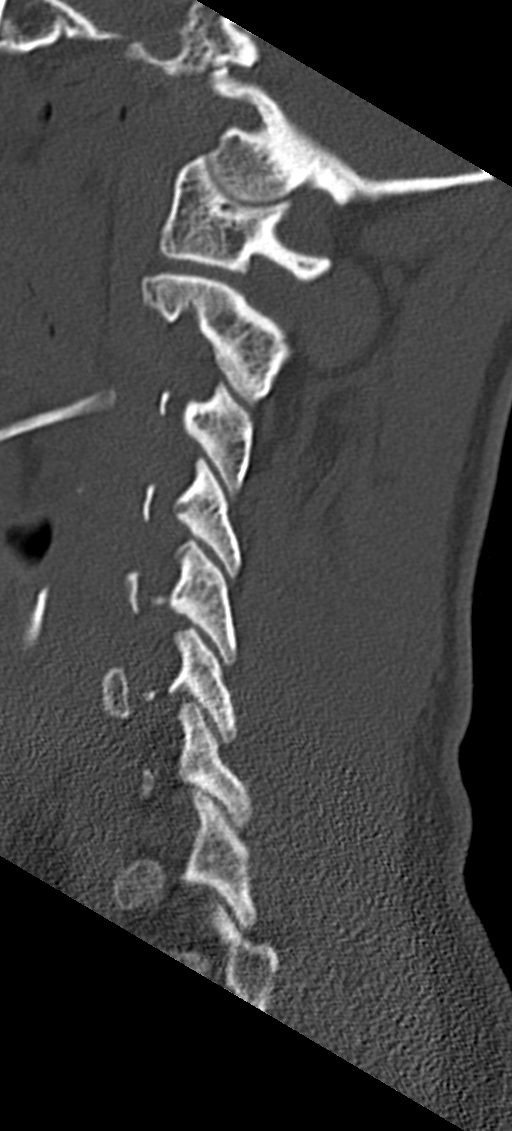
[im 22/53  bone]
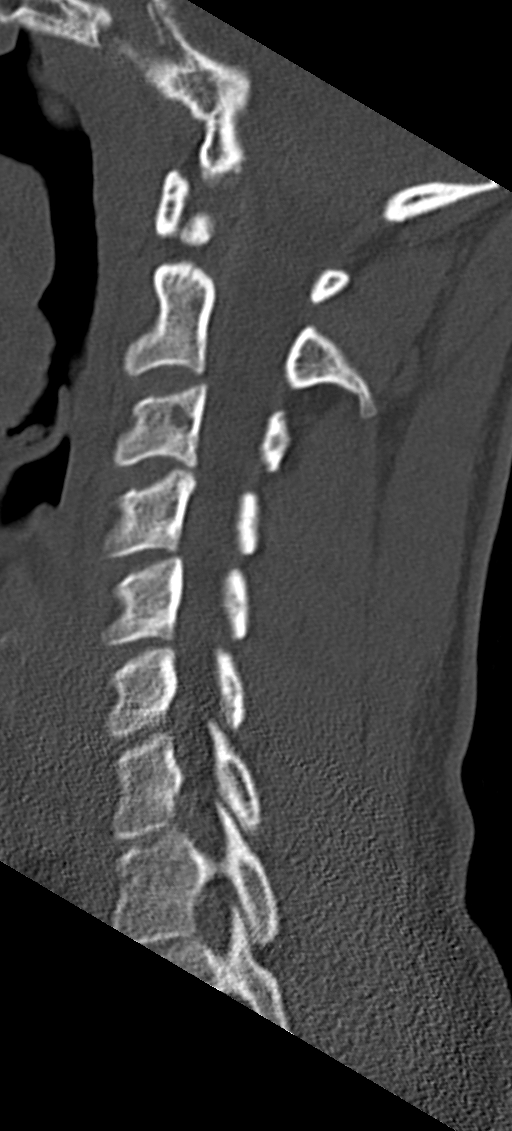
[im 27/53  bone]
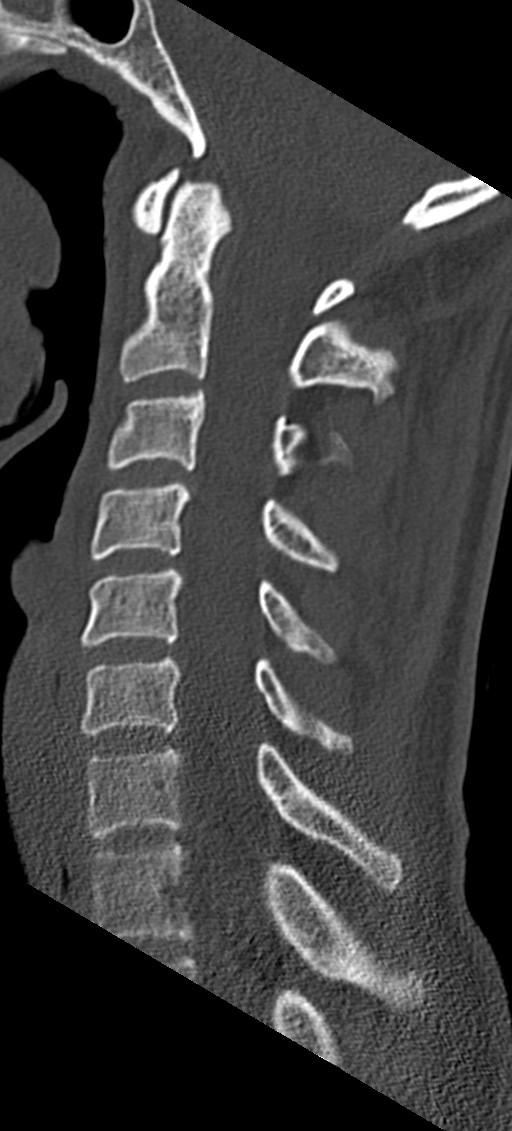
[im 31/53  bone]
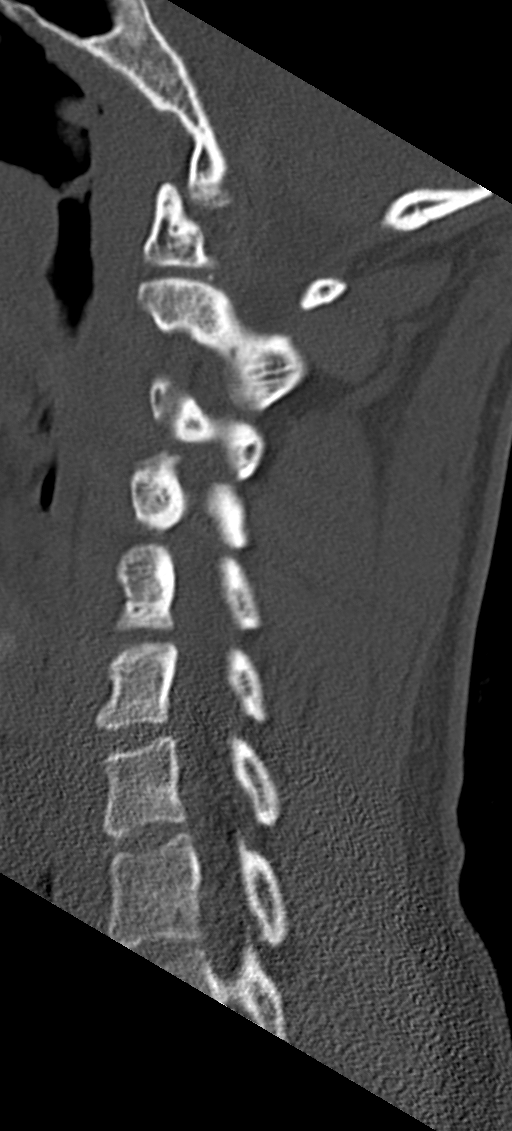
[im 35/53  bone]
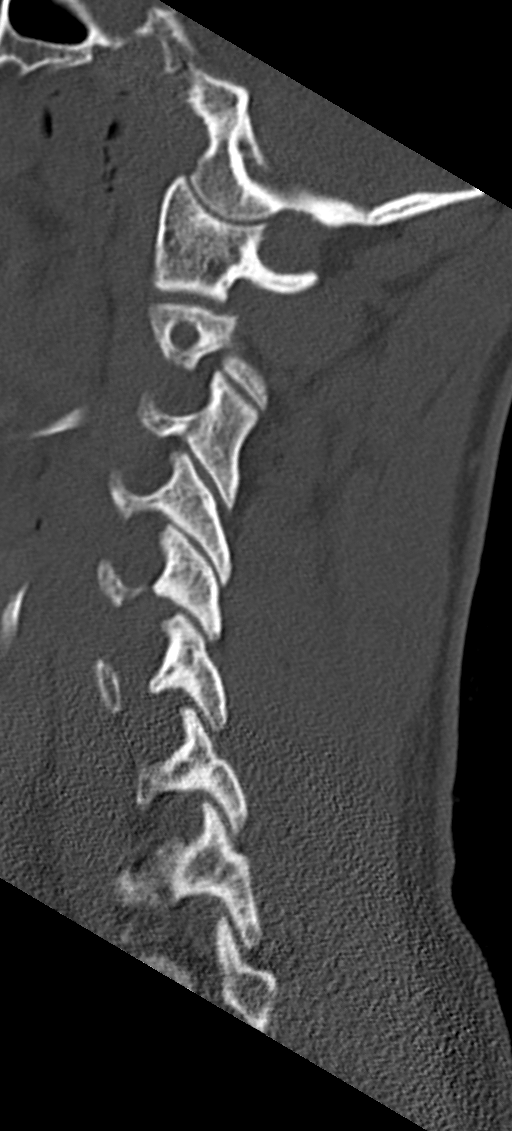

[9 of 35 positions shown; findings below may reference images not displayed]

FINDINGS: CT HEAD FINDINGS

Brain: No evidence of acute infarction, hemorrhage, hydrocephalus,
extra-axial collection or mass lesion/mass effect.

Vascular: No hyperdense vessel identified.

Skull: Left frontal/periorbital contusion. No acute calvarial
fracture.

Other: No mastoid effusions.

CT MAXILLOFACIAL FINDINGS

Osseous: No fracture or mandibular dislocation. No destructive
process.

Orbits: Left periorbital contusion. No evidence of retro bulbar
contusion. Extra-ocular muscles, lacrimal glands, and globes are
symmetric and within normal limits. No proptosis. No evidence of
orbital fracture

Sinuses: Right sphenoid sinus mucosal thickening. Otherwise, sinuses
are clear.

Soft tissues: Left forehead/periorbital contusion.

CT CERVICAL SPINE FINDINGS

Alignment: Straightening without substantial sagittal subluxation.
Mild upper cervical dextrocurvature and lower cervical levocurvature

Skull base and vertebrae: Vertebral body heights are maintained. No
evidence of acute fracture. Small limbus vertebra of the
inferior/anterior C5 vertebral body, unchanged.

Soft tissues and spinal canal: No prevertebral fluid or swelling. No
visible canal hematoma.

Disc levels:  Mild bony degenerative change.

Upper chest: Visualized lung apices are clear.
IMPRESSION: 1. No evidence of acute intracranial abnormality.
2. Left forehead/periorbital contusion without acute fracture.
3. No evidence of acute fracture or traumatic malalignment in the
cervical spine.

## 2022-11-06 ENCOUNTER — Other Ambulatory Visit: Payer: Self-pay

## 2022-11-06 ENCOUNTER — Observation Stay (HOSPITAL_COMMUNITY)
Admission: EM | Admit: 2022-11-06 | Discharge: 2022-11-07 | Disposition: A | Discharge status: Home / Self Care | Payer: Medicaid Other | Attending: Internal Medicine | Admitting: Internal Medicine

## 2022-11-06 ENCOUNTER — Emergency Department (HOSPITAL_COMMUNITY): Payer: Medicaid Other

## 2022-11-06 DIAGNOSIS — J9601 Acute respiratory failure with hypoxia: Secondary | ICD-10-CM | POA: Diagnosis present

## 2022-11-06 DIAGNOSIS — M7732 Calcaneal spur, left foot: Secondary | ICD-10-CM | POA: Diagnosis present

## 2022-11-06 DIAGNOSIS — G928 Other toxic encephalopathy: Secondary | ICD-10-CM | POA: Diagnosis present

## 2022-11-06 DIAGNOSIS — J9811 Atelectasis: Secondary | ICD-10-CM | POA: Diagnosis present

## 2022-11-06 DIAGNOSIS — E875 Hyperkalemia: Secondary | ICD-10-CM | POA: Diagnosis present

## 2022-11-06 DIAGNOSIS — T50901A Poisoning by unspecified drugs, medicaments and biological substances, accidental (unintentional), initial encounter: Principal | ICD-10-CM | POA: Diagnosis present

## 2022-11-06 DIAGNOSIS — Y929 Unspecified place or not applicable: Secondary | ICD-10-CM

## 2022-11-06 DIAGNOSIS — Z79899 Other long term (current) drug therapy: Secondary | ICD-10-CM | POA: Insufficient documentation

## 2022-11-06 DIAGNOSIS — F17210 Nicotine dependence, cigarettes, uncomplicated: Secondary | ICD-10-CM | POA: Diagnosis present

## 2022-11-06 DIAGNOSIS — X58XXXA Exposure to other specified factors, initial encounter: Secondary | ICD-10-CM | POA: Diagnosis present

## 2022-11-06 DIAGNOSIS — G9340 Encephalopathy, unspecified: Secondary | ICD-10-CM

## 2022-11-06 DIAGNOSIS — F32A Depression, unspecified: Secondary | ICD-10-CM | POA: Diagnosis present

## 2022-11-06 DIAGNOSIS — F1910 Other psychoactive substance abuse, uncomplicated: Secondary | ICD-10-CM | POA: Insufficient documentation

## 2022-11-06 DIAGNOSIS — M779 Enthesopathy, unspecified: Secondary | ICD-10-CM | POA: Diagnosis present

## 2022-11-06 DIAGNOSIS — Z87898 Personal history of other specified conditions: Secondary | ICD-10-CM

## 2022-11-06 DIAGNOSIS — Z7952 Long term (current) use of systemic steroids: Secondary | ICD-10-CM

## 2022-11-06 DIAGNOSIS — G9389 Other specified disorders of brain: Secondary | ICD-10-CM | POA: Diagnosis present

## 2022-11-06 DIAGNOSIS — B955 Unspecified streptococcus as the cause of diseases classified elsewhere: Secondary | ICD-10-CM | POA: Diagnosis present

## 2022-11-06 DIAGNOSIS — R739 Hyperglycemia, unspecified: Secondary | ICD-10-CM | POA: Diagnosis present

## 2022-11-06 DIAGNOSIS — S92352A Displaced fracture of fifth metatarsal bone, left foot, initial encounter for closed fracture: Secondary | ICD-10-CM | POA: Diagnosis present

## 2022-11-06 LAB — I-STAT VENOUS BLOOD GAS, ED
Acid-base deficit: 4 mmol/L — ABNORMAL HIGH (ref 0.0–2.0)
Bicarbonate: 25.8 mmol/L (ref 20.0–28.0)
Calcium, Ion: 1.19 mmol/L (ref 1.15–1.40)
HCT: 42 % (ref 36.0–46.0)
Hemoglobin: 14.3 g/dL (ref 12.0–15.0)
O2 Saturation: 99 %
Potassium: 7.2 mmol/L (ref 3.5–5.1)
Sodium: 137 mmol/L (ref 135–145)
TCO2: 28 mmol/L (ref 22–32)
pCO2, Ven: 67.6 mmHg — ABNORMAL HIGH (ref 44–60)
pH, Ven: 7.189 — CL (ref 7.25–7.43)
pO2, Ven: 183 mmHg — ABNORMAL HIGH (ref 32–45)

## 2022-11-06 LAB — CBC WITH DIFFERENTIAL/PLATELET
Abs Immature Granulocytes: 0.23 10*3/uL — ABNORMAL HIGH (ref 0.00–0.07)
Basophils Absolute: 0.1 10*3/uL (ref 0.0–0.1)
Basophils Relative: 0 %
Eosinophils Absolute: 0.1 10*3/uL (ref 0.0–0.5)
Eosinophils Relative: 0 %
HCT: 40.2 % (ref 36.0–46.0)
Hemoglobin: 13.3 g/dL (ref 12.0–15.0)
Immature Granulocytes: 1 %
Lymphocytes Relative: 3 %
Lymphs Abs: 1 10*3/uL (ref 0.7–4.0)
MCH: 33.3 pg (ref 26.0–34.0)
MCHC: 33.1 g/dL (ref 30.0–36.0)
MCV: 100.5 fL — ABNORMAL HIGH (ref 80.0–100.0)
Monocytes Absolute: 0.8 10*3/uL (ref 0.1–1.0)
Monocytes Relative: 3 %
Neutro Abs: 27.3 10*3/uL — ABNORMAL HIGH (ref 1.7–7.7)
Neutrophils Relative %: 93 %
Platelets: 350 10*3/uL (ref 150–400)
RBC: 4 MIL/uL (ref 3.87–5.11)
RDW: 11.9 % (ref 11.5–15.5)
WBC: 29.5 10*3/uL — ABNORMAL HIGH (ref 4.0–10.5)
nRBC: 0 % (ref 0.0–0.2)

## 2022-11-06 LAB — COMPREHENSIVE METABOLIC PANEL
ALT: 18 U/L (ref 0–44)
AST: 31 U/L (ref 15–41)
Albumin: 3.7 g/dL (ref 3.5–5.0)
Alkaline Phosphatase: 49 U/L (ref 38–126)
Anion gap: 10 (ref 5–15)
BUN: 10 mg/dL (ref 6–20)
CO2: 18 mmol/L — ABNORMAL LOW (ref 22–32)
Calcium: 8.6 mg/dL — ABNORMAL LOW (ref 8.9–10.3)
Chloride: 108 mmol/L (ref 98–111)
Creatinine, Ser: 1.09 mg/dL — ABNORMAL HIGH (ref 0.44–1.00)
GFR, Estimated: >60 mL/min (ref >60)
Glucose, Bld: 313 mg/dL — ABNORMAL HIGH (ref 70–99)
Potassium: 5.5 mmol/L — ABNORMAL HIGH (ref 3.5–5.1)
Sodium: 136 mmol/L (ref 135–145)
Total Bilirubin: 0.2 mg/dL — ABNORMAL LOW (ref 0.3–1.2)
Total Protein: 6.4 g/dL — ABNORMAL LOW (ref 6.5–8.1)

## 2022-11-06 LAB — I-STAT BETA HCG BLOOD, ED (MC, WL, AP ONLY): I-stat hCG, quantitative: <5 m[IU]/mL (ref <5)

## 2022-11-06 LAB — TROPONIN I (HIGH SENSITIVITY): Troponin I (High Sensitivity): 30 ng/L — ABNORMAL HIGH (ref <18)

## 2022-11-06 LAB — SALICYLATE LEVEL: Salicylate Lvl: <7 mg/dL — ABNORMAL LOW (ref 7.0–30.0)

## 2022-11-06 LAB — ACETAMINOPHEN LEVEL: Acetaminophen (Tylenol), Serum: <10 ug/mL — ABNORMAL LOW (ref 10–30)

## 2022-11-06 MED ORDER — LACTATED RINGERS IV BOLUS
1000.0000 mL | Freq: Once | INTRAVENOUS | Status: AC
  Administered 2022-11-06: 1000 mL via INTRAVENOUS

## 2022-11-06 MED ORDER — NALOXONE HCL 2 MG/2ML IJ SOSY
1.0000 mg | PREFILLED_SYRINGE | Freq: Once | INTRAMUSCULAR | Status: AC
  Administered 2022-11-06: 1 mg via INTRAVENOUS

## 2022-11-06 MED ORDER — NALOXONE HCL 2 MG/2ML IJ SOSY
1.0000 mg | PREFILLED_SYRINGE | Freq: Once | INTRAMUSCULAR | Status: DC
  Filled 2022-11-06: qty 2

## 2022-11-06 MED ORDER — NALOXONE HCL 4 MG/10ML IJ SOLN
0.5000 mg/h | INTRAVENOUS | Status: DC
  Administered 2022-11-06: 0.5 mg/h via INTRAVENOUS
  Filled 2022-11-06 (×2): qty 10
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
## 2022-11-06 NOTE — ED Provider Notes (Signed)
[facility] EMERGENCY DEPARTMENT Provider Note   CSN: [id number] Arrival date & time: 11/06/22  2112     History  Chief Complaint  Patient presents with   Drug Overdose    Erika Taylor is a 36 y.o. female.   Drug Overdose   The patient is a 36 year old female with past medical history of polysubstance abuse, and chronic back pain presenting to the emergency department by EMS for a potential drug overdose.  EMS state that they were called because the patient was unresponsive.  On arrival, the patient had been receiving bystander CPR for an estimated minimum of 20 minutes.  She was noted to have pulses on arrival so CPR was discontinued.  EMS report that on their arrival her GCS was 3, she was extremely cyanotic, breathing approximately 3-5 times per minute with an SPO2 of 75% on room air.  They stated that she had been given 3 vials of Narcan by the people in the house.  EMS gave 1 mg of Narcan IM and the patient had improvement in her mental status to GCS 13 (E3:V4:M6).  They placed the patient on a nonrebreather which improved her oxygen saturations to the low 90s.  She was hemodynamically stable throughout transport.  On arrival, patient endorses snorting a white powder but is unclear on what exactly it was that she took.  She denies intravenous drug use.  She said that she has not had any recent infectious symptoms, fever, cough, chest pain, shortness of breath, abdominal pain, or nausea or vomiting.       Home Medications Prior to Admission medications   Medication Sig Start Date End Date Taking? Authorizing Provider  ARIPiprazole (ABILIFY) 5 MG tablet Take 1 tablet (5 mg total) by mouth daily. For mood stabilization 01/15/21   Vanetta Mulders, NP  cephALEXin (KEFLEX) 500 MG capsule Take 1 capsule (500 mg total) by mouth 4 (four) times daily. 12/30/21   Jacalyn Lefevre, MD  hydrOXYzine (ATARAX/VISTARIL) 25 MG tablet Take 1 tablet (25 mg total) by mouth every 6  (six) hours as needed for anxiety. 01/15/21   Vanetta Mulders, NP  metroNIDAZOLE (FLAGYL) 500 MG tablet Take 1 tablet (500 mg total) by mouth 2 (two) times daily. 06/04/21   LampteyBritta Mccreedy, MD  naloxone [facility]) nasal spray 4 mg/0.1 mL Place 1 spray into the nose once as needed for up to 1 dose. 01/29/21   Tegeler, Canary Brim, MD  predniSONE (DELTASONE) 20 MG tablet Take 2 tablets (40 mg total) by mouth daily. 06/02/21   Mardella Layman, MD  sertraline (ZOLOFT) 100 MG tablet Take 1 tablet (100 mg total) by mouth daily. 01/16/21   Vanetta Mulders, NP  amitriptyline (ELAVIL) 25 MG tablet 1 po tid PRN anxiety or pain 09/21/18 11/16/20  Opalski, Gavin Pound, DO  pregabalin (LYRICA) 75 MG capsule Take 1 capsule (75 mg total) by mouth 2 (two) times daily. 09/21/18 11/16/20  Thomasene Lot, DO      Allergies    Penicillins    Review of Systems   Review of Systems  See HPI  Physical Exam Updated Vital Signs BP 106/63   Pulse 63   Temp 98.2 F (36.8 C) (Oral)   Resp 11   Ht 5\' 9"  (1.753 m)   Wt 70 kg   SpO2 94%   BMI 22.79 kg/m  Physical Exam Vitals and nursing note reviewed.  Constitutional:      General: She is not in acute distress.  Appearance: She is well-developed.  HENT:     Head: Normocephalic and atraumatic.     Right Ear: External ear normal.     Left Ear: External ear normal.     Mouth/Throat:     Mouth: Mucous membranes are dry.  Eyes:     Conjunctiva/sclera: Conjunctivae normal.  Cardiovascular:     Rate and Rhythm: Normal rate and regular rhythm.     Heart sounds: No murmur heard. Pulmonary:     Effort: No respiratory distress.     Breath sounds: Normal breath sounds.     Comments: The patient is somnolent and respirations drop to 6/min when allowed to fall asleep.  Her respirations increased to 14 when woken. Abdominal:     Palpations: Abdomen is soft.     Tenderness: There is no abdominal tenderness.  Musculoskeletal:        General: No swelling.      Cervical back: Neck supple.  Skin:    General: Skin is warm and dry.     Capillary Refill: Capillary refill takes less than 2 seconds.  Neurological:     Mental Status: She is alert.     Comments: Somnolent but arousable with GCS of 13 (E3: V4: M6)  Psychiatric:        Mood and Affect: Mood normal.     ED Results / Procedures / Treatments   Labs (all labs ordered are listed, but only abnormal results are displayed) Labs Reviewed  COMPREHENSIVE METABOLIC PANEL - Abnormal; Notable for the following components:      Result Value   Potassium 5.5 (*)    CO2 18 (*)    Glucose, Bld 313 (*)    Creatinine, Ser 1.09 (*)    Calcium 8.6 (*)    Total Protein 6.4 (*)    Total Bilirubin 0.2 (*)    All other components within normal limits  CBC WITH DIFFERENTIAL/PLATELET - Abnormal; Notable for the following components:   WBC 29.5 (*)    MCV 100.5 (*)    Neutro Abs 27.3 (*)    Abs Immature Granulocytes 0.23 (*)    All other components within normal limits  ACETAMINOPHEN LEVEL - Abnormal; Notable for the following components:   Acetaminophen (Tylenol), Serum <10 (*)    All other components within normal limits  SALICYLATE LEVEL - Abnormal; Notable for the following components:   Salicylate Lvl <7.0 (*)    All other components within normal limits  I-STAT VENOUS BLOOD GAS, ED - Abnormal; Notable for the following components:   pH, Ven 7.189 (*)    pCO2, Ven 67.6 (*)    pO2, Ven 183 (*)    Acid-base deficit 4.0 (*)    Potassium 7.2 (*)    All other components within normal limits  TROPONIN I (HIGH SENSITIVITY) - Abnormal; Notable for the following components:   Troponin I (High Sensitivity) 30 (*)    All other components within normal limits  CULTURE, BLOOD (ROUTINE X 2)  CULTURE, BLOOD (ROUTINE X 2)  URINALYSIS, ROUTINE W REFLEX MICROSCOPIC  RAPID URINE DRUG SCREEN, HOSP PERFORMED  I-STAT BETA HCG BLOOD, ED ([facility], [facility], [facility] ONLY)  TROPONIN I (HIGH SENSITIVITY)     EKG None  Radiology DG Chest Portable 1 View  Result Date: 11/06/2022 CLINICAL DATA:  Found down. EXAM: PORTABLE CHEST 1 VIEW COMPARISON:  Chest x-ray 06/02/2021 FINDINGS: The heart size and mediastinal contours are within normal limits. Both lungs are clear. The visualized skeletal structures are unremarkable. IMPRESSION:  No active disease. Electronically Signed   By: Darliss Cheney M.D.   On: 11/06/2022 21:52    Procedures Procedures    Medications Ordered in ED Medications  naloxone HCl (NARCAN) 4 mg in dextrose 5 % 250 mL infusion (has no administration in time range)  lactated ringers bolus 1,000 mL (1,000 mLs Intravenous New Bag/Given 11/06/22 2153)  naloxone [facility]) injection 1 mg (1 mg Intravenous Given 11/06/22 2150)    ED Course/ Medical Decision Making/ A&P                           Medical Decision Making  The patient is a 36 year old female with past medical history of polysubstance abuse, and chronic back pain presenting to the emergency department by EMS for a potential drug overdose.  The differential diagnosis considered includes: Overdose, cardiac arrest, seizure, syncope.  On initial presentation, the patient was noted to be somnolent but arousable with GCS of 13.  She was on nonrebreather at 5 L/min.  She was hemodynamically stable and afebrile.  There were no significant findings on physical exam.  I personally reviewed and interpreted all parts of the patient's diagnostic work-up.  Her diagnostic work-up included a EKG which showed sinus rhythm without ischemic changes; chest x-ray which did not show any signs concerning for focal consolidation, aspiration, pneumothorax, rib fracture, or other cardiopulmonary abnormality.  Her laboratory work-up included a i-STAT VBG which showed a pH of 7.189, PCO2 67.6; CBC with white blood cell count elevated to 29.5, hemoglobin 13.3; salicylate and acetaminophen level which were negative; CMP with potassium of 5.5, bicarb 18,  glucose elevated at 313, creatinine 1.09; hCG was negative; serial troponins which were elevated to 30 with a second one pending at the time of admission.  Based on the patient's response to Narcan, history, and physical exam and unintentional overdose is favored as likely source of her symptoms.  She adamantly denies intentional self-harm.  The patient's troponin elevation is favored to be the result of demand ischemia and there is low concern for ACS at this time.  The patient was reassessed multiple times and initially seemed to be improving however, was noted to be somnolent again with agonal respirations of 6/min so a Narcan drip was initiated.  The patient was admitted to the ICU for further management.  Amount and/or Complexity of Data Reviewed Independent Historian: EMS External Data Reviewed: labs and notes. Labs: ordered. Decision-making details documented in ED Course. Radiology: ordered and independent interpretation performed. Decision-making details documented in ED Course. ECG/medicine tests: ordered and independent interpretation performed. Decision-making details documented in ED Course.  Risk Prescription drug management. Decision regarding hospitalization.   Patient's presentation is most consistent with acute presentation with potential threat to life or bodily function.         Final Clinical Impression(s) / ED Diagnoses Final diagnoses:  Accidental overdose, initial encounter    Rx / DC Orders ED Discharge Orders     None         Knox Saliva, MD 11/06/22 2340    Maia Plan, MD 11-21-2022 [phone]

## 2022-11-06 NOTE — ED Notes (Signed)
Dr. Jacqulyn Bath notified of critical lab results.

## 2022-11-07 ENCOUNTER — Inpatient Hospital Stay (HOSPITAL_COMMUNITY): Payer: Medicaid Other

## 2022-11-07 DIAGNOSIS — T50901A Poisoning by unspecified drugs, medicaments and biological substances, accidental (unintentional), initial encounter: Secondary | ICD-10-CM | POA: Diagnosis present

## 2022-11-07 DIAGNOSIS — J9601 Acute respiratory failure with hypoxia: Secondary | ICD-10-CM | POA: Diagnosis not present

## 2022-11-07 DIAGNOSIS — G9340 Encephalopathy, unspecified: Secondary | ICD-10-CM

## 2022-11-07 LAB — BLOOD CULTURE ID PANEL (REFLEXED) - BCID2

## 2022-11-07 LAB — ECHOCARDIOGRAM COMPLETE
AR max vel: 2.8 cm2
AV Area VTI: 2.92 cm2
AV Area mean vel: 2.68 cm2
AV Mean grad: 6 mmHg
AV Peak grad: 11 mmHg
Ao pk vel: 1.66 m/s
Area-P 1/2: 2.88 cm2
Height: 69 in
S' Lateral: 3.4 cm
Weight: 3075.86 oz

## 2022-11-07 LAB — URINALYSIS, ROUTINE W REFLEX MICROSCOPIC
Bilirubin Urine: NEGATIVE
Glucose, UA: >=500 mg/dL — AB
Ketones, ur: NEGATIVE mg/dL
Leukocytes,Ua: NEGATIVE
Nitrite: NEGATIVE
Protein, ur: NEGATIVE mg/dL
Specific Gravity, Urine: 1.011 (ref 1.005–1.030)
pH: 5 (ref 5.0–8.0)

## 2022-11-07 LAB — CBC
HCT: 37.3 % (ref 36.0–46.0)
HCT: 36.6 % (ref 36.0–46.0)
Hemoglobin: 12.4 g/dL (ref 12.0–15.0)
Hemoglobin: 11.6 g/dL — ABNORMAL LOW (ref 12.0–15.0)
MCH: 33.3 pg (ref 26.0–34.0)
MCH: 32.1 pg (ref 26.0–34.0)
MCHC: 33.2 g/dL (ref 30.0–36.0)
MCHC: 31.7 g/dL (ref 30.0–36.0)
MCV: 100.3 fL — ABNORMAL HIGH (ref 80.0–100.0)
MCV: 101.4 fL — ABNORMAL HIGH (ref 80.0–100.0)
Platelets: 310 10*3/uL (ref 150–400)
Platelets: 279 10*3/uL (ref 150–400)
RBC: 3.72 MIL/uL — ABNORMAL LOW (ref 3.87–5.11)
RBC: 3.61 MIL/uL — ABNORMAL LOW (ref 3.87–5.11)
RDW: 12 % (ref 11.5–15.5)
RDW: 12 % (ref 11.5–15.5)
WBC: 19.8 10*3/uL — ABNORMAL HIGH (ref 4.0–10.5)
WBC: 15.6 10*3/uL — ABNORMAL HIGH (ref 4.0–10.5)
nRBC: 0 % (ref 0.0–0.2)
nRBC: 0 % (ref 0.0–0.2)

## 2022-11-07 LAB — RAPID URINE DRUG SCREEN, HOSP PERFORMED
Amphetamines: NOT DETECTED
Barbiturates: NOT DETECTED
Benzodiazepines: POSITIVE — AB
Cocaine: POSITIVE — AB
Opiates: NOT DETECTED
Tetrahydrocannabinol: NOT DETECTED

## 2022-11-07 LAB — COMPREHENSIVE METABOLIC PANEL
ALT: 16 U/L (ref 0–44)
AST: 24 U/L (ref 15–41)
Albumin: 3.5 g/dL (ref 3.5–5.0)
Alkaline Phosphatase: 38 U/L (ref 38–126)
Anion gap: 8 (ref 5–15)
BUN: 9 mg/dL (ref 6–20)
CO2: 25 mmol/L (ref 22–32)
Calcium: 9 mg/dL (ref 8.9–10.3)
Chloride: 105 mmol/L (ref 98–111)
Creatinine, Ser: 0.93 mg/dL (ref 0.44–1.00)
GFR, Estimated: >60 mL/min (ref >60)
Glucose, Bld: 97 mg/dL (ref 70–99)
Potassium: 4 mmol/L (ref 3.5–5.1)
Sodium: 138 mmol/L (ref 135–145)
Total Bilirubin: 0.4 mg/dL (ref 0.3–1.2)
Total Protein: 6.3 g/dL — ABNORMAL LOW (ref 6.5–8.1)

## 2022-11-07 LAB — ETHANOL: Alcohol, Ethyl (B): <10 mg/dL (ref <10)

## 2022-11-07 LAB — CREATININE, SERUM
Creatinine, Ser: 0.94 mg/dL (ref 0.44–1.00)
GFR, Estimated: >60 mL/min (ref >60)

## 2022-11-07 LAB — GLUCOSE, CAPILLARY
Glucose-Capillary: 105 mg/dL — ABNORMAL HIGH (ref 70–99)
Glucose-Capillary: 70 mg/dL (ref 70–99)
Glucose-Capillary: 121 mg/dL — ABNORMAL HIGH (ref 70–99)
Glucose-Capillary: 114 mg/dL — ABNORMAL HIGH (ref 70–99)

## 2022-11-07 LAB — HEMOGLOBIN A1C
Hgb A1c MFr Bld: 5.3 % (ref 4.8–5.6)
Mean Plasma Glucose: 105 mg/dL

## 2022-11-07 LAB — MRSA NEXT GEN BY PCR, NASAL: MRSA by PCR Next Gen: NOT DETECTED

## 2022-11-07 LAB — LACTIC ACID, PLASMA
Lactic Acid, Venous: 1.5 mmol/L (ref 0.5–1.9)
Lactic Acid, Venous: 2.4 mmol/L (ref 0.5–1.9)
Lactic Acid, Venous: 1.6 mmol/L (ref 0.5–1.9)

## 2022-11-07 LAB — AMMONIA: Ammonia: 23 umol/L (ref 9–35)

## 2022-11-07 LAB — TROPONIN I (HIGH SENSITIVITY): Troponin I (High Sensitivity): 77 ng/L — ABNORMAL HIGH (ref <18)

## 2022-11-07 LAB — PROCALCITONIN: Procalcitonin: 1.64 ng/mL

## 2022-11-07 LAB — HIV ANTIBODY (ROUTINE TESTING W REFLEX): HIV Screen 4th Generation wRfx: NONREACTIVE

## 2022-11-07 LAB — MAGNESIUM: Magnesium: 2.1 mg/dL (ref 1.7–2.4)

## 2022-11-07 MED ORDER — CALCIUM GLUCONATE-NACL 1-0.675 GM/50ML-% IV SOLN
1.0000 g | Freq: Once | INTRAVENOUS | Status: AC
  Administered 2022-11-07: 1000 mg via INTRAVENOUS
  Filled 2022-11-07: qty 50

## 2022-11-07 MED ORDER — HEPARIN SODIUM (PORCINE) 5000 UNIT/ML IJ SOLN
5000.0000 [IU] | Freq: Three times a day (TID) | INTRAMUSCULAR | Status: DC
  Administered 2022-11-07 (×3): 5000 [IU] via SUBCUTANEOUS
  Filled 2022-11-07 (×4): qty 1

## 2022-11-07 MED ORDER — DOCUSATE SODIUM 100 MG PO CAPS: 100.0000 mg | ORAL_CAPSULE | Freq: Two times a day (BID) | ORAL | Status: DC | PRN

## 2022-11-07 MED ORDER — IBUPROFEN 100 MG PO CHEW: 800.0000 mg | CHEWABLE_TABLET | Freq: Three times a day (TID) | ORAL | Status: DC | PRN

## 2022-11-07 MED ORDER — INSULIN ASPART 100 UNIT/ML IJ SOLN
0.0000 [IU] | INTRAMUSCULAR | Status: DC
  Administered 2022-11-07: 1 [IU] via SUBCUTANEOUS

## 2022-11-07 MED ORDER — CHLORHEXIDINE GLUCONATE CLOTH 2 % EX PADS: 6.0000 | MEDICATED_PAD | Freq: Every day | CUTANEOUS | Status: DC

## 2022-11-07 MED ORDER — ORAL CARE MOUTH RINSE: 15.0000 mL | OROMUCOSAL | Status: DC | PRN

## 2022-11-07 MED ORDER — IBUPROFEN 800 MG PO TABS: 800.0000 mg | ORAL_TABLET | Freq: Three times a day (TID) | ORAL | 0 refills | Status: AC | PRN

## 2022-11-07 MED ORDER — DEXTROSE 50 % IV SOLN
25.0000 mL | Freq: Once | INTRAVENOUS | Status: AC
  Administered 2022-11-07: 25 mL via INTRAVENOUS
  Filled 2022-11-07: qty 50

## 2022-11-07 MED ORDER — IBUPROFEN 800 MG PO TABS
800.0000 mg | ORAL_TABLET | Freq: Three times a day (TID) | ORAL | Status: DC | PRN
  Administered 2022-11-07: 800 mg via ORAL
  Filled 2022-11-07 (×2): qty 1

## 2022-11-07 MED ORDER — LACTATED RINGERS IV BOLUS
1000.0000 mL | Freq: Once | INTRAVENOUS | Status: AC
  Administered 2022-11-07: 1000 mL via INTRAVENOUS

## 2022-11-07 MED ORDER — INSULIN ASPART 100 UNIT/ML IV SOLN
10.0000 [IU] | Freq: Once | INTRAVENOUS | Status: AC
  Administered 2022-11-07: 10 [IU] via INTRAVENOUS

## 2022-11-07 MED ORDER — POLYETHYLENE GLYCOL 3350 17 G PO PACK: 17.0000 g | PACK | Freq: Every day | ORAL | Status: DC | PRN

## 2022-11-07 MED ORDER — NALOXONE HCL 0.4 MG/ML IJ SOLN: 0.4000 mg | INTRAMUSCULAR | Status: DC | PRN

## 2022-11-07 NOTE — Progress Notes (Signed)
PCCM Interval Progress Note  Left foot and ankle pain. Xrays obtained earlier which demonstrated 22mm avulsion fx in base of 5th metatarsal, possible tiny plantar spur in calcaneus along with bony spurs in dorsal aspect of talonavicular joint.  Ibuprofen ordered already.  Discussed with Charma Igo, PA - C of ortho. Recommendations include post op shoe, WBAT, f/u with Dr. Odis Hollingshead of [facility].   Rutherford Guys, PA - C [place] [facility] For pager details, please see AMION or use Epic chat  After 1900, please call [facility] for cross coverage needs 11/07/2022, 1:46 PM

## 2022-11-07 NOTE — Progress Notes (Addendum)
eLink Physician-Brief Progress Note Patient Name: Erika Taylor DOB: [birth year] MRN: [id number]   Date of Service  11/07/2022  HPI/Events of Note  36 year old female with past medical history of polysubstance abuse, and chronic back pain presenting to the emergency department by EMS for a potential drug overdose. Bystander CPR for 20 mis, in ED CPR stopped. S/p Narcan x 3. Endorses snorting a white powder.  No intentional self harm. Narcan helps but recurring AMS,  ICU for narcan drip.   Data: Reviewed. Potassium down to 5.5, from > 7 after hyperkalemia RX Leukocytosis at 29 K, now 19.8 Tylenol < 10, Salicylate < 7 VBG pH , pco2 67. Trop 30. EKG sinus, qtc 417 CxR no chf or Pneumonia Hg 13 Cr normalized, hcg neg Ammonia 23, LFT ok Alcohol < 10   Camera: Now she talking to bed side RN. Just rolled in settling. On nasal o2. MAP ok. HR 81.  Non focal.  A/P;  Drug overdose-AMS. On narcan drip. - protecting her airways. -asp and sz precautions - Tox screen is pending  - alcohol abuse, level low. To go on CIWA protocol. - 2. S/p bystander CPR. Trop low and EKG; no STEMI or NSTEMI. - trend troponin. - keep MAP > 65. - continue fluids  3. S/p Hyperkalemia/AKI - s/p fluids, hyperkalemia Rx, - follow labs  4. MDD/Anxiety - consider Phych evaluation.  On SSI. Goals < 180 Low risk for VTE for now.  AMS from Substance abuse  eICU Interventions       Intervention Category Major Interventions: Change in mental status - evaluation and management Intermediate Interventions: Electrolyte abnormality - evaluation and management Evaluation Type: New Patient Evaluation  Ranee Gosselin 11/07/2022, 1:45 AM  2:04 AM - asking for fluids to take.discussed with RN, should be ok to take fluids.  Will go with clear liquids for now, asp precautions

## 2022-11-07 NOTE — Progress Notes (Signed)
Orthopedic Tech Progress Note Patient Details:  Erika Taylor 1986-03-12 [id number]  Ortho Devices Type of Ortho Device: Postop shoe/boot Ortho Device/Splint Location: LLE Ortho Device/Splint Interventions: Ordered, Application, Removal   Post Interventions Patient Tolerated: Well Instructions Provided: Care of device  Donald Pore 11/07/2022, 2:30 PM

## 2022-11-07 NOTE — Progress Notes (Signed)
  Echocardiogram 2D Echocardiogram has been performed.  Gerda Diss 11/07/2022, 9:03 AM

## 2022-11-07 NOTE — H&P (Signed)
NAME:  Erika Taylor, MRN:  [id number], DOB:  [birth year], LOS: 0 ADMISSION DATE:  11/06/2022, CONSULTATION DATE:  11/07/22 REFERRING MD:  EDP, CHIEF COMPLAINT:  overdose   History of Present Illness:  36 yo female with pmh polysubstance abuse who was reportedly found down by bystanders after unknown downtime but was given 8mg  narcan. She was give 20 mins of bystander cpr, ems arrived, pt did indeed have pulses and gave 1mg  narcan with arousal. Oxygen saturations noted to be low ~70's 2-4 resp/min and gcs 13. Pt remained lethargic but arousable en route.  On NRB sats improved to 90's.   Upon arrival to ed pt was still somnolent but given more narcan with improvement. She was eventually started on narcan infusion. Other vss.   On my evaluation pt was arousable and denied any n/v/d no cp/sobl stated that she was not able to recall why she was at hospital but had taken some "white powder" that she was told was cocaine at home. She does not recall anything else and denies any associated symptoms other than being thirsty.   Pertinent  Medical History  H/o polysubstance abuse  Significant Hospital Events: Including procedures, antibiotic start and stop dates in addition to other pertinent events   Admitted to ICU 12/4 after 20 mins cpr, suspected overdose  Interim History / Subjective:    Objective   Blood pressure 105/66, pulse 68, temperature 98.2 F (36.8 C), temperature source Oral, resp. rate 16, height 5\' 9"  (1.753 m), weight 70 kg, SpO2 93 %.       No intake or output data in the 24 hours ending 11/07/22 0019 Filed Weights   11/06/22 2126  Weight: 70 kg    Examination: General: drowsy but arousable, reclining comfortably in bed with nrb in place.  HENT: [place], some redness and scratches on R forehead/temple area, sclera injected, mm dry but pink Lungs: ciminished bilaterally Cardiovascular: tachycardia Abdomen: soft, nt,nd Extremities: R hand edema noted, le bilaterally with  trace edema, no c/c Neuro: arousable. Conversant, oriented to self and place, no focal deficits.  GU: deferred  Resolved Hospital Problem list     Assessment & Plan:  Acute overdose unknown substance:  H/o polysubstance abuse -reportedly had 20 min bystander CPR -responsive to narcan -start infusion and admit to ICU -has some hypoxia req NRB at times as well when somnolent -uds pending -substance counseling -encourage cessation  Questionable oohca:  -20 min bystander CPR reported -ems arrived and pt did have pulses but was cyanotic and agonal respirations -perhaps echo in am.  -no rib fractures on routine cxr and pt denies any cp at this time.   Acute hypoxic resp failure:  -2/2 above and resp depression -encourage pulm hygiene once pt is better awake on narcan infusion -sats improve when she does wear her oxygen -cxr without acute process noted  Hyperkalemia:  -repeat cxr now after fluids.  -if remains elevated will stop lr and utilize NS -given calcium  Leukocytosis:  -agree with cx -hold on abx for now.   Hyperglycemia:  -check A1c -ssi   Best Practice (right click and "Reselect all SmartList Selections" daily)   Diet/type: clear liquids DVT prophylaxis: LMWH GI prophylaxis: N/A Lines: N/A Foley:  N/A Code Status:  full code Last date of multidisciplinary goals of care discussion [pending sobriety or ability to contact family]  Labs   CBC: Recent Labs  Lab 11/06/22 2138 11/06/22 2201  WBC 29.5*  --   NEUTROABS 27.3*  --  HGB 13.3 14.3  HCT 40.2 42.0  MCV 100.5*  --   PLT 350  --     Basic Metabolic Panel: Recent Labs  Lab 11/06/22 2138 11/06/22 2201  NA 136 137  K 5.5* 7.2*  CL 108  --   CO2 18*  --   GLUCOSE 313*  --   BUN 10  --   CREATININE 1.09*  --   CALCIUM 8.6*  --    GFR: Estimated Creatinine Clearance: 74.6 mL/min (A) (by C-G formula based on SCr of 1.09 mg/dL (H)). Recent Labs  Lab 11/06/22 2138  WBC 29.5*    Liver  Function Tests: Recent Labs  Lab 11/06/22 2138  AST 31  ALT 18  ALKPHOS 49  BILITOT 0.2*  PROT 6.4*  ALBUMIN 3.7   No results for input(s): "LIPASE", "AMYLASE" in the last 168 hours. No results for input(s): "AMMONIA" in the last 168 hours.  ABG    Component Value Date/Time   HCO3 25.8 11/06/2022 2201   TCO2 28 11/06/2022 2201   ACIDBASEDEF 4.0 (H) 11/06/2022 2201   O2SAT 99 11/06/2022 2201     Coagulation Profile: No results for input(s): "INR", "PROTIME" in the last 168 hours.  Cardiac Enzymes: No results for input(s): "CKTOTAL", "CKMB", "CKMBINDEX", "TROPONINI" in the last 168 hours.  HbA1C: Hgb A1c MFr Bld  Date/Time Value Ref Range Status  01/13/2021 06:30 AM 5.5 4.8 - 5.6 % Final    Comment:    (NOTE) Pre diabetes:          5.7%-6.4%  Diabetes:              >6.4%  Glycemic control for   <7.0% adults with diabetes     CBG: No results for input(s): "GLUCAP" in the last 168 hours.  Review of Systems:   As per HPI  Past Medical History:  She,  has a past medical history of Depression.   Surgical History:  No past surgical history on file.   Social History:   reports that she has been smoking cigarettes. She started smoking about 7 years ago. She has a 6.00 pack-year smoking history. She has never used smokeless tobacco. She reports current alcohol use. She reports that she does not currently use drugs after having used the following drugs: Cocaine, Benzodiazepines, Barbituates, and Marijuana.   Family History:  Her family history includes Healthy in her father and mother. She was adopted.   Allergies Allergies  Allergen Reactions   Penicillins Rash     Home Medications  Prior to Admission medications   Medication Sig Start Date End Date Taking? Authorizing Provider  ARIPiprazole (ABILIFY) 5 MG tablet Take 1 tablet (5 mg total) by mouth daily. For mood stabilization 01/15/21   Vanetta Mulders, NP  cephALEXin (KEFLEX) 500 MG capsule Take 1  capsule (500 mg total) by mouth 4 (four) times daily. 12/30/21   Jacalyn Lefevre, MD  hydrOXYzine (ATARAX/VISTARIL) 25 MG tablet Take 1 tablet (25 mg total) by mouth every 6 (six) hours as needed for anxiety. 01/15/21   Vanetta Mulders, NP  metroNIDAZOLE (FLAGYL) 500 MG tablet Take 1 tablet (500 mg total) by mouth 2 (two) times daily. 06/04/21   LampteyBritta Mccreedy, MD  naloxone [facility]) nasal spray 4 mg/0.1 mL Place 1 spray into the nose once as needed for up to 1 dose. 01/29/21   Tegeler, Canary Brim, MD  predniSONE (DELTASONE) 20 MG tablet Take 2 tablets (40 mg total) by mouth daily. 06/02/21  Mardella Layman, MD  sertraline (ZOLOFT) 100 MG tablet Take 1 tablet (100 mg total) by mouth daily. 01/16/21   Vanetta Mulders, NP  amitriptyline (ELAVIL) 25 MG tablet 1 po tid PRN anxiety or pain 09/21/18 11/16/20  Opalski, Gavin Pound, DO  pregabalin (LYRICA) 75 MG capsule Take 1 capsule (75 mg total) by mouth 2 (two) times daily. 09/21/18 11/16/20  Thomasene Lot, DO     Critical care time: 41 min cc time excluding procedures.

## 2022-11-07 NOTE — Progress Notes (Addendum)
NAME:  Erika Taylor, MRN:  [id number], DOB:  [birth year], LOS: 0 ADMISSION DATE:  11/06/2022, CONSULTATION DATE:  11/07/22 REFERRING MD:  EDP, CHIEF COMPLAINT:  overdose   History of Present Illness:  36 yo female with pmh polysubstance abuse who was reportedly found down by bystanders after unknown downtime but was given 8mg  narcan. She was give 20 mins of bystander cpr, ems arrived, pt did indeed have pulses and gave 1mg  narcan with arousal. Oxygen saturations noted to be low ~70's 2-4 resp/min and gcs 13. Pt remained lethargic but arousable en route.  On NRB sats improved to 90's.   Upon arrival to ed pt was still somnolent but given more narcan with improvement. She was eventually started on narcan infusion. Other vss.   On my evaluation pt was arousable and denied any n/v/d no cp/sobl stated that she was not able to recall why she was at hospital but had taken some "white powder" that she was told was cocaine at home. She does not recall anything else and denies any associated symptoms other than being thirsty.   Pertinent  Medical History  H/o polysubstance abuse  Significant Hospital Events: Including procedures, antibiotic start and stop dates in addition to other pertinent events   Admitted to ICU 12/4 after 20 mins cpr, suspected overdose  Interim History / Subjective:  Awake, following commands. Narcan at 0.5. Admits to "partying" yesterday and snorting some unkonwn substance. Says she has a hx of polysubstance abuse while "experimenting when I was younger" but denies any recent consistent drug use. UDS positive for benzo's and cocaine. After questioning multiple times, she admitted to injecting crystal meth 2 days PTA. Sig other at bedside states pt was unresponsive and although he doesn't think she lost pulses, he performed CPR regardless. Having L ankle pain and stiffness.  Objective   Blood pressure 116/63, pulse 75, temperature 98.9 F (37.2 C), temperature source Oral,  resp. rate (!) 22, height 5\' 9"  (1.753 m), weight 87.2 kg, SpO2 98 %.        Intake/Output Summary (Last 24 hours) at 11/07/2022 0811 Last data filed at 11/07/2022 0700 Gross per 24 hour  Intake 1606.95 ml  Output 2800 ml  Net -1193.05 ml   Filed Weights   11/06/22 2126 11/07/22 0500  Weight: 70 kg 87.2 kg    Examination: General: Adult female, resting in bed, in NAD. Neuro: A&O x 3, no deficits. HEENT: [place]/AT. Sclerae anicteric. EOMI. Cardiovascular: RRR, no M/R/G.  Lungs: Respirations even and unlabored.  CTA bilaterally, No W/R/R. Abdomen: BS x 4, soft, NT/ND.  Musculoskeletal: No gross deformities, no edema.  L ankle pain and stiffness. Tenderness and resistance to manipulation/exam. Intact DP and PT pulses. Intact sensation. Skin: Intact, warm, no rashes.   Assessment & Plan:   Acute overdose unknown substance - admitted to injecting crystal meth 2 days PTA. UDS positive for benzo's and cocaine. Per chart review, does have hx of overdose in past with ED visit Feb 2022 where she admitted to injecting fentanyl and heroin. She left AMA at the time. H/o remote polysubstance abuse - Wean narcan gtt to off. - Narcan PRN. - Polysubstance abuse counseling.  Questionable OOHCA - doubtful that she actually lost pulses as she is completely neurologically intact this AM. Sig other at bedside states that he does not feel that she truly lost a pulse but he performed CPR regardless to be sure. - Get echo given hx polysubstance abuse.  Acute hypoxic resp failure - presumed  atelectasis from hypoventilation in setting resp depression. - Continue supplemental O2 as needed to maintain SpO2 > 92%. - Encourage pulm hygiene. - Mobilize.  Hyperglycemia:  - Check A1c. - SSI.  L ankle pain and stiffness. - Get ankle and foot xray.  Best Practice (right click and "Reselect all SmartList Selections" daily)   Diet/type: clear liquids DVT prophylaxis: LMWH GI prophylaxis: N/A Lines:  N/A Foley:  N/A Code Status:  full code Last date of multidisciplinary goals of care discussion [pending sobriety or ability to contact family]  Additional CC time: 25 min.    Rutherford Guys, PA - C [place] [facility] For pager details, please see AMION or use Epic chat  After 1900, please call [facility] for cross coverage needs 11/07/2022, 8:41 AM

## 2022-11-07 NOTE — Discharge Summary (Signed)
Physician Discharge Summary   Patient ID: Erika Taylor MRN: [id number] DOB/AGE: [id number] 36 y.o.  Admit date: 11/06/2022 Discharge date: 11/07/2022                     Discharge Plan by Diagnosis   Acute overdose unknown substance - UDS positive for benzo's and cocaine and also admitted to injecting possible crystal meth 2 days PTA. Per chart review, does have hx of overdose in past with ED visit Feb 2022 where she admitted to injecting fentanyl and heroin. She left AMA at the time. Questionable OOHCA - doubtful that she actually lost pulses as she is completely neurologically intact this AM. Sig other at bedside states that he does not feel that she truly lost a pulse but he performed CPR regardless to be sure. Echo reassuring and normal. H/o remote polysubstance abuse. - Substance abuse cessation provided and reiterated. - Narcan PRN (she has this already).    L ankle pain and stiffness - Xrays obtained and demonstrated 44m avulsion fx in base of 5th metatarsal, possible tiny plantar spur in calcaneus along with bony spurs in dorsal aspect of talonavicular joint.  - PRN Ibuprofen and ice packs. - Post op shoe recommended by ortho with outpatient f/u, weight bearing as tolerated. - [facility]   Discharge Summary    BGali Spinneyis a 36y.o. y/o female with a PMH of anxiety, depression, polysubstance abuse. [facility] being found down by bystanders who started CPR for roughly 20 minutes prior to EMS arrival.  In ED, she was somnolent but arousable. She never required intubation. She had some improvement with Narcan; therefore, was started on Narcan infusion. She informed admitting team that she snorted a white substance that she was told was cocaine. UDS positive for benzos and cocaine.  12/4, she had significant improvement. Narcan infusion was stopped that morning and she remained stable. During rounds, she  admitted to also injecting drugs 2 days prior, possibly crystal meth. She had an echo which was normal. She also complained of left foot and ankle pain. Xrays of the left foot and ankle were obtained and demonstrated 367mavulsion fx in base of 5th metatarsal, possible tiny plantar spur in calcaneus along with bony spurs in dorsal aspect of talonavicular joint.   Later that afternoon, she was deemed medically stable and was cleared for discharge to home.  The importance of polysubstance abuse cessation was stressed to her multiple times and she was advised that given her history of multiple ED presentations and now admissions, she is at extremely high risk of death from drug overdose if she continues on this path. She voiced understanding.         Significant Hospital Events   12/3 ED presentation. 12/4 discharge.  Significant Diagnostic Studies  Echo 12/4 > EF 36-65% No obvious vegetations. L foot and ankle xray 12/4 > 27m94mvulsion fx in base of 5th metatarsal, possible tiny plantar spur in calcaneus along with bony spurs in dorsal aspect of talonavicular joint.   Micro Data  Blood 12/3 >  Objective:  Blood pressure 107/89, pulse 78, temperature 99 F (37.2 C), temperature source Oral, resp. rate (!) 34, height _0  (1.753 m), weight 87.2 kg, SpO2 97 %.        Intake/Output Summary (Last 24 hours) at 11/07/2022 1512 Last data filed at 11/07/2022 0800 Gross per 24 hour  Intake 1634.6 ml  Output 2800 ml  Net -1165.4 ml   Filed Weights   11/06/22 2126 11/07/22 0500  Weight: 70 kg 87.2 kg    Physical Examination: General: Adult female, sitting up in bed, in NAD. Neuro: A&O x 3, non-focal.  HEENT: [name]. EOMI, sclerae anicteric. Cardiovascular: RRR, no M/R/G.  Lungs: Respirations even and unlabored.  CTA bilaterally, No W/R/R. Abdomen: BS x 4, soft, NT/ND.  Musculoskeletal: No gross deformities, no edema. L ankle and foot tender with restricted motion (limited by pain). PT and DP  pulses intact, sensation intact. No erythema or rash. Skin: Intact, warm, no rashes.   Discharge Labs:  BMET Recent Labs  Lab 11/06/22 2138 11/06/22 2201 11/06/22 2328 11/07/22 0017 11/07/22 0406  NA 136 137  --   --  138  K 5.5* 7.2*  --   --  4.0  CL 108  --   --   --  105  CO2 18*  --   --   --  25  GLUCOSE 313*  --   --   --  97  BUN 10  --   --   --  9  CREATININE 1.09*  --   --  0.94 0.93  CALCIUM 8.6*  --   --   --  9.0  MG  --   --  2.1  --   --     CBC Recent Labs  Lab 11/06/22 2138 11/06/22 2201 11/07/22 0017 11/07/22 0406  HGB 13.3 14.3 12.4 11.6*  HCT 40.2 42.0 37.3 36.6  WBC 29.5*  --  19.8* 15.6*  PLT 350  --  310 279    Anti-Coagulation No results for input(s): "INR" in the last 168 hours.  Discharge Instructions     Diet - low sodium heart healthy   Complete by: As directed    Increase activity slowly   Complete by: As directed          Follow-up Information     Armond Hang, MD. Schedule an appointment as soon as possible for a visit.   Specialty: Orthopedic Surgery Why: Please call [facility] to make a followup appointment Contact information: [address], [address] [place] [address] [phone]                  Allergies as of 11/07/2022       Reactions   Penicillins Rash        Medication List     TAKE these medications    ALPRAZolam 0.5 MG tablet Commonly known as: XANAX Take 0.5 mg by mouth 3 (three) times daily.   escitalopram 10 MG tablet Commonly known as: LEXAPRO Take 10 mg by mouth daily.   ibuprofen 800 MG tablet Commonly known as: ADVIL Take 1 tablet (800 mg total) by mouth every 8 (eight) hours as needed for moderate pain (ankle pain).   naloxone 4 MG/0.1ML Liqd nasal spray kit Commonly known as: NARCAN Place 1 spray into the nose once as needed for up to 1 dose.   nystatin ointment Commonly known as: MYCOSTATIN Apply 1 Application topically 3 (three) times daily.  Lips         Disposition: Home.   Discharge Condition:  Erika Taylor has met maximum benefit of inpatient care and is medically stable and cleared for discharge.   Time spent on discharge: Greater than 25 minutes.    Montey Hora, [place] [facility] For pager details, please see AMION If  no response to pager, please call [phone] until 7:00 PM After 7:00 PM, please call Elink at [phone] 11/07/2022, 3:12 PM

## 2022-11-08 NOTE — Progress Notes (Signed)
Patient's blood culture 1 out of 2 bottles came back positive for Streptococcus mitis/oralis, she was discharged home yesterday with no symptoms.  Tried calling her back to let her know about the blood culture results, I was unable to reach her on the phone number listed in the chart. [phone], [phone] Also I tried calling patient's mother with no response [phone]   Will try to call her again later.    Cheri Fowler, MD [place] [facility] See Amion for pager If no response to pager, please call [phone] until 7pm After 7pm, Please call E-link [phone]

## 2022-11-09 LAB — URINE CULTURE: Culture: 20000 — AB

## 2022-11-09 LAB — CULTURE, BLOOD (ROUTINE X 2): Special Requests: ADEQUATE

## 2022-11-10 ENCOUNTER — Emergency Department (HOSPITAL_COMMUNITY): Payer: Medicaid Other

## 2022-11-10 ENCOUNTER — Inpatient Hospital Stay (HOSPITAL_COMMUNITY)
Admission: EM | Admit: 2022-11-10 | Discharge: 2022-12-05 | DRG: 917 | Disposition: E | Payer: Medicaid Other | Attending: Critical Care Medicine | Admitting: Critical Care Medicine

## 2022-11-10 DIAGNOSIS — R7881 Bacteremia: Secondary | ICD-10-CM | POA: Diagnosis present

## 2022-11-10 DIAGNOSIS — I469 Cardiac arrest, cause unspecified: Secondary | ICD-10-CM | POA: Diagnosis not present

## 2022-11-10 DIAGNOSIS — G9389 Other specified disorders of brain: Secondary | ICD-10-CM | POA: Diagnosis present

## 2022-11-10 DIAGNOSIS — F1310 Sedative, hypnotic or anxiolytic abuse, uncomplicated: Secondary | ICD-10-CM | POA: Diagnosis present

## 2022-11-10 DIAGNOSIS — K7200 Acute and subacute hepatic failure without coma: Secondary | ICD-10-CM | POA: Diagnosis present

## 2022-11-10 DIAGNOSIS — I9589 Other hypotension: Secondary | ICD-10-CM | POA: Diagnosis present

## 2022-11-10 DIAGNOSIS — F1410 Cocaine abuse, uncomplicated: Secondary | ICD-10-CM | POA: Diagnosis present

## 2022-11-10 DIAGNOSIS — Z880 Allergy status to penicillin: Secondary | ICD-10-CM

## 2022-11-10 DIAGNOSIS — M6282 Rhabdomyolysis: Secondary | ICD-10-CM | POA: Diagnosis present

## 2022-11-10 DIAGNOSIS — Z66 Do not resuscitate: Secondary | ICD-10-CM | POA: Diagnosis not present

## 2022-11-10 DIAGNOSIS — L89899 Pressure ulcer of other site, unspecified stage: Secondary | ICD-10-CM | POA: Diagnosis present

## 2022-11-10 DIAGNOSIS — R9401 Abnormal electroencephalogram [EEG]: Secondary | ICD-10-CM | POA: Diagnosis not present

## 2022-11-10 DIAGNOSIS — F1911 Other psychoactive substance abuse, in remission: Secondary | ICD-10-CM

## 2022-11-10 DIAGNOSIS — E871 Hypo-osmolality and hyponatremia: Secondary | ICD-10-CM | POA: Diagnosis present

## 2022-11-10 DIAGNOSIS — B954 Other streptococcus as the cause of diseases classified elsewhere: Secondary | ICD-10-CM | POA: Diagnosis present

## 2022-11-10 DIAGNOSIS — D689 Coagulation defect, unspecified: Secondary | ICD-10-CM | POA: Diagnosis present

## 2022-11-10 DIAGNOSIS — J9601 Acute respiratory failure with hypoxia: Secondary | ICD-10-CM | POA: Diagnosis present

## 2022-11-10 DIAGNOSIS — N289 Disorder of kidney and ureter, unspecified: Secondary | ICD-10-CM

## 2022-11-10 DIAGNOSIS — N170 Acute kidney failure with tubular necrosis: Secondary | ICD-10-CM | POA: Diagnosis present

## 2022-11-10 DIAGNOSIS — E162 Hypoglycemia, unspecified: Secondary | ICD-10-CM

## 2022-11-10 DIAGNOSIS — F32A Depression, unspecified: Secondary | ICD-10-CM | POA: Diagnosis present

## 2022-11-10 DIAGNOSIS — F1110 Opioid abuse, uncomplicated: Secondary | ICD-10-CM | POA: Diagnosis present

## 2022-11-10 DIAGNOSIS — R578 Other shock: Secondary | ICD-10-CM | POA: Diagnosis present

## 2022-11-10 DIAGNOSIS — G9341 Metabolic encephalopathy: Secondary | ICD-10-CM | POA: Diagnosis present

## 2022-11-10 DIAGNOSIS — Z79899 Other long term (current) drug therapy: Secondary | ICD-10-CM

## 2022-11-10 DIAGNOSIS — Z515 Encounter for palliative care: Secondary | ICD-10-CM

## 2022-11-10 DIAGNOSIS — F17210 Nicotine dependence, cigarettes, uncomplicated: Secondary | ICD-10-CM | POA: Diagnosis present

## 2022-11-10 DIAGNOSIS — R680 Hypothermia, not associated with low environmental temperature: Secondary | ICD-10-CM | POA: Diagnosis present

## 2022-11-10 DIAGNOSIS — I2489 Other forms of acute ischemic heart disease: Secondary | ICD-10-CM | POA: Diagnosis present

## 2022-11-10 DIAGNOSIS — T50901A Poisoning by unspecified drugs, medicaments and biological substances, accidental (unintentional), initial encounter: Principal | ICD-10-CM | POA: Diagnosis present

## 2022-11-10 DIAGNOSIS — L89159 Pressure ulcer of sacral region, unspecified stage: Secondary | ICD-10-CM | POA: Diagnosis present

## 2022-11-10 DIAGNOSIS — E875 Hyperkalemia: Secondary | ICD-10-CM

## 2022-11-10 DIAGNOSIS — G40901 Epilepsy, unspecified, not intractable, with status epilepticus: Secondary | ICD-10-CM | POA: Diagnosis present

## 2022-11-10 DIAGNOSIS — I10 Essential (primary) hypertension: Secondary | ICD-10-CM | POA: Diagnosis present

## 2022-11-10 DIAGNOSIS — E874 Mixed disorder of acid-base balance: Secondary | ICD-10-CM | POA: Diagnosis present

## 2022-11-10 LAB — I-STAT CHEM 8, ED
BUN: 30 mg/dL — ABNORMAL HIGH (ref 6–20)
Calcium, Ion: 0.87 mmol/L — CL (ref 1.15–1.40)
Chloride: 108 mmol/L (ref 98–111)
Creatinine, Ser: 2.9 mg/dL — ABNORMAL HIGH (ref 0.44–1.00)
Glucose, Bld: <20 mg/dL — CL (ref 70–99)
HCT: 39 % (ref 36.0–46.0)
Hemoglobin: 13.3 g/dL (ref 12.0–15.0)
Potassium: 6.5 mmol/L (ref 3.5–5.1)
Sodium: 137 mmol/L (ref 135–145)
TCO2: 18 mmol/L — ABNORMAL LOW (ref 22–32)

## 2022-11-10 LAB — CBC WITH DIFFERENTIAL/PLATELET
Abs Immature Granulocytes: 0.45 10*3/uL — ABNORMAL HIGH (ref 0.00–0.07)
Basophils Absolute: 0 10*3/uL (ref 0.0–0.1)
Basophils Relative: 0 %
Eosinophils Absolute: 0 10*3/uL (ref 0.0–0.5)
Eosinophils Relative: 0 %
HCT: 43.3 % (ref 36.0–46.0)
Hemoglobin: 12.7 g/dL (ref 12.0–15.0)
Immature Granulocytes: 5 %
Lymphocytes Relative: 26 %
Lymphs Abs: 2.4 10*3/uL (ref 0.7–4.0)
MCH: 33.4 pg (ref 26.0–34.0)
MCHC: 29.3 g/dL — ABNORMAL LOW (ref 30.0–36.0)
MCV: 113.9 fL — ABNORMAL HIGH (ref 80.0–100.0)
Monocytes Absolute: 0.8 10*3/uL (ref 0.1–1.0)
Monocytes Relative: 9 %
Neutro Abs: 5.3 10*3/uL (ref 1.7–7.7)
Neutrophils Relative %: 60 %
Platelets: 244 10*3/uL (ref 150–400)
RBC: 3.8 MIL/uL — ABNORMAL LOW (ref 3.87–5.11)
RDW: 11.8 % (ref 11.5–15.5)
WBC: 9 10*3/uL (ref 4.0–10.5)
nRBC: 1.8 % — ABNORMAL HIGH (ref 0.0–0.2)

## 2022-11-10 LAB — CBG MONITORING, ED: Glucose-Capillary: 157 mg/dL — ABNORMAL HIGH (ref 70–99)

## 2022-11-10 LAB — I-STAT ARTERIAL BLOOD GAS, ED
Acid-base deficit: 23 mmol/L — ABNORMAL HIGH (ref 0.0–2.0)
Bicarbonate: 9.8 mmol/L — ABNORMAL LOW (ref 20.0–28.0)
Calcium, Ion: 0.91 mmol/L — ABNORMAL LOW (ref 1.15–1.40)
HCT: 32 % — ABNORMAL LOW (ref 36.0–46.0)
Hemoglobin: 10.9 g/dL — ABNORMAL LOW (ref 12.0–15.0)
O2 Saturation: 98 %
Patient temperature: 97.2
Potassium: 6 mmol/L — ABNORMAL HIGH (ref 3.5–5.1)
Sodium: 136 mmol/L (ref 135–145)
TCO2: 11 mmol/L — ABNORMAL LOW (ref 22–32)
pCO2 arterial: 53.7 mmHg — ABNORMAL HIGH (ref 32–48)
pH, Arterial: 6.863 — CL (ref 7.35–7.45)
pO2, Arterial: 181 mmHg — ABNORMAL HIGH (ref 83–108)

## 2022-11-10 MED ORDER — SODIUM CHLORIDE 0.9 % IV BOLUS
1000.0000 mL | Freq: Once | INTRAVENOUS | Status: AC
  Administered 2022-11-10: 1000 mL via INTRAVENOUS

## 2022-11-10 MED ORDER — SODIUM ZIRCONIUM CYCLOSILICATE 5 G PO PACK
15.0000 g | PACK | Freq: Once | ORAL | Status: AC
  Administered 2022-11-11: 15 g
  Filled 2022-11-10 (×2): qty 1

## 2022-11-10 MED ORDER — EPINEPHRINE HCL 5 MG/250ML IV SOLN IN NS
INTRAVENOUS | Status: AC
  Administered 2022-11-10: 10 ug/min via INTRAVENOUS
  Filled 2022-11-10: qty 250

## 2022-11-10 MED ORDER — STERILE WATER FOR INJECTION IV SOLN
INTRAVENOUS | Status: AC
  Filled 2022-11-10: qty 150
  Filled 2022-11-10: qty 1000

## 2022-11-10 MED ORDER — CALCIUM GLUCONATE-NACL 1-0.675 GM/50ML-% IV SOLN: 1.0000 g | Freq: Once | INTRAVENOUS | Status: AC

## 2022-11-10 MED ORDER — SODIUM BICARBONATE 8.4 % IV SOLN
100.0000 meq | Freq: Once | INTRAVENOUS | Status: AC
  Administered 2022-11-11: 100 meq via INTRAVENOUS
  Filled 2022-11-10: qty 50

## 2022-11-10 MED ORDER — EPINEPHRINE HCL 5 MG/250ML IV SOLN IN NS
0.5000 ug/min | INTRAVENOUS | Status: DC
  Administered 2022-11-11 – 2022-11-12 (×6): 20 ug/min via INTRAVENOUS
  Administered 2022-11-12: 18 ug/min via INTRAVENOUS
  Administered 2022-11-12: 8 ug/min via INTRAVENOUS
  Administered 2022-11-12: 14 ug/min via INTRAVENOUS
  Administered 2022-11-12: 20 ug/min via INTRAVENOUS
  Filled 2022-11-10 (×11): qty 250

## 2022-11-10 MED ORDER — CALCIUM GLUCONATE-NACL 1-0.675 GM/50ML-% IV SOLN
INTRAVENOUS | Status: AC
  Administered 2022-11-10: 1 g via INTRAVENOUS
  Filled 2022-11-10: qty 50

## 2022-11-10 MED ORDER — DEXTROSE 50 % IV SOLN
1.0000 | Freq: Once | INTRAVENOUS | Status: AC
  Administered 2022-11-10: 50 mL via INTRAVENOUS

## 2022-11-10 NOTE — ED Provider Notes (Signed)
I provided a substantive portion of the care of this patient.  I personally performed the entirety of the history, exam, and medical decision making for this encounter.    .Critical Care  Performed by: Blane Ohara, MD Authorized by: Blane Ohara, MD   Critical care provider statement:    Critical care time (minutes):  40   Critical care start time:  11/30/2022 11:05 PM   Critical care end time:  11/11/2022 11:45 PM   Critical care time was exclusive of:  Separately billable procedures and treating other patients and teaching time   Critical care was necessary to treat or prevent imminent or life-threatening deterioration of the following conditions:  Cardiac failure   Critical care was time spent personally by me on the following activities:  Development of treatment plan with patient or surrogate, discussions with consultants, evaluation of patient's response to treatment, examination of patient, ordering and review of laboratory studies, ordering and review of radiographic studies, ordering and performing treatments and interventions, pulse oximetry, re-evaluation of patient's condition and review of old charts Ultrasound ED Echo  Date/Time: 11/24/2022 11:20 PM  Performed by: Blane Ohara, MD Authorized by: Blane Ohara, MD   Procedure details:    Indications: cardiac arrest     Views: parasternal long axis view and parasternal short axis view     Images: archived     Limitations:  Body habitus Findings:    Pericardium: no pericardial effusion     Cardiac Activity: normal cardiac activity     LV Function: depressed (30 - 50%)   Impression:    Impression: abnormal cardiac activity   CPR  Date/Time: 11/11/2022 12:10 AM  Performed by: Blane Ohara, MD Authorized by: Blane Ohara, MD  CPR Procedure Details:      Amount of time prior to administration of ACLS/BLS (minutes):  5   ACLS/BLS initiated by EMS: Yes     CPR/ACLS performed in the ED: Yes     Duration of CPR  (minutes):  5   Outcome: ROSC obtained    CPR performed via ACLS guidelines under my direct supervision.  See RN documentation for details including defibrillator use, medications, doses and timing.  Cardiac arrest [facility])  History of drug abuse (HCC)  Acute renal insufficiency  Hypoglycemia  Hyperkalemia    Blane Ohara, MD 11/11/22 0011

## 2022-11-10 NOTE — ED Provider Notes (Signed)
[facility] EMERGENCY DEPARTMENT Provider Note   CSN: [id number] Arrival date & time: 11/13/2022  2252     History  Chief Complaint  Patient presents with   CPR     Bellamia Ferch is a 36 y.o. female who arrives as a level 1 with CPR in progress by EMS.  Patient with history of polysubstance use, recent overdose on 12/30.  Per family last seen alive was 45 minutes prior to patient being found pulseless and apneic.  Patient received Narcan with fire, EMS initiated CPR at 2145.  They did receive ROSC with subsequent loss of pulses x 2.  Patient lost pulses as they were unloading in the ambulance bay at [facility] emergency department.  Patient received 6 epis and total prior to arrival and was on epi drip through left tibial IO at time of arrival.  ET tube in place by EMS.  Level 5 caveat due to acuity presentation upon arrival.  I personally have reviewed her medical records.  In addition to polysubstance use patient has history of MDD.  HPI     Home Medications Prior to Admission medications   Medication Sig Start Date End Date Taking? Authorizing Provider  ALPRAZolam Prudy Feeler) 0.5 MG tablet Take 0.5 mg by mouth 3 (three) times daily. 10/31/22   [provider]  escitalopram (LEXAPRO) 10 MG tablet Take 10 mg by mouth daily. 10/05/22   [provider]  ibuprofen (ADVIL) 800 MG tablet Take 1 tablet (800 mg total) by mouth every 8 (eight) hours as needed for moderate pain (ankle pain). 11/07/22   Desai, Rahul P, PA-C  naloxone [facility]) nasal spray 4 mg/0.1 mL Place 1 spray into the nose once as needed for up to 1 dose. 01/29/21   Tegeler, Canary Brim, MD  nystatin ointment (MYCOSTATIN) Apply 1 Application topically 3 (three) times daily. Lips 10/17/22   [provider]  amitriptyline (ELAVIL) 25 MG tablet 1 po tid PRN anxiety or pain 09/21/18 11/16/20  Opalski, Gavin Pound, DO  pregabalin (LYRICA) 75 MG capsule Take 1 capsule (75 mg total) by mouth  2 (two) times daily. 09/21/18 11/16/20  Thomasene Lot, DO      Allergies    Penicillins    Review of Systems   Review of Systems  Unable to perform ROS: Acuity of condition    Physical Exam Updated Vital Signs There were no vitals taken for this visit. Physical Exam Vitals and nursing note reviewed.  Constitutional:      General: She is in acute distress.     Appearance: She is overweight. She is not ill-appearing.     Interventions: She is intubated. Cervical collar and backboard in place.  HENT:     Head: Normocephalic and atraumatic.     Nose: Nose normal.     Mouth/Throat:     Mouth: Mucous membranes are moist.     Pharynx: No oropharyngeal exudate or posterior oropharyngeal erythema.     Comments: Crusted vomitus around the mouth Eyes:     General:        Right eye: No discharge.        Left eye: No discharge.     Conjunctiva/sclera: Conjunctivae normal.     Comments: Pupils fixed and dilated bilaterally to 8 mm.  Cardiovascular:     Comments: Normal sinus rhythm after ROSC,, femoral pulses after ROSC. HR in the 70s.  Pulmonary:     Effort: No respiratory distress. She is intubated.     Comments:  Bilateral breath sounds in context of intubation Abdominal:     General: Bowel sounds are normal. There is no distension.     Tenderness: There is no abdominal tenderness.  Musculoskeletal:        General: No deformity.     Cervical back: Neck supple.     Comments: Bruising over anterior and posterior left thigh and left buttock, bruising over the medial right thigh as well  Skin:    General: Skin is warm and dry.  Neurological:     GCS: GCS eye subscore is 1. GCS verbal subscore is 1. GCS motor subscore is 1.  Psychiatric:        Mood and Affect: Mood normal.     ED Results / Procedures / Treatments   Labs (all labs ordered are listed, but only abnormal results are displayed) Labs Reviewed  CBC WITH DIFFERENTIAL/PLATELET - Abnormal; Notable for the following  components:      Result Value   RBC 3.80 (*)    MCV 113.9 (*)    MCHC 29.3 (*)    nRBC 1.8 (*)    Abs Immature Granulocytes 0.45 (*)    All other components within normal limits  I-STAT CHEM 8, ED - Abnormal; Notable for the following components:   Potassium 6.5 (*)    BUN 30 (*)    Creatinine, Ser 2.90 (*)    Glucose, Bld <20 (*)    Calcium, Ion 0.87 (*)    TCO2 18 (*)    All other components within normal limits  CBG MONITORING, ED - Abnormal; Notable for the following components:   Glucose-Capillary 157 (*)    All other components within normal limits  I-STAT ARTERIAL BLOOD GAS, ED - Abnormal; Notable for the following components:   pH, Arterial 6.863 (*)    pCO2 arterial 53.7 (*)    pO2, Arterial 181 (*)    Bicarbonate 9.8 (*)    TCO2 11 (*)    Acid-base deficit 23.0 (*)    Potassium 6.0 (*)    Calcium, Ion 0.91 (*)    HCT 32.0 (*)    Hemoglobin 10.9 (*)    All other components within normal limits  COMPREHENSIVE METABOLIC PANEL  LACTIC ACID, PLASMA  LACTIC ACID, PLASMA  URINALYSIS, ROUTINE W REFLEX MICROSCOPIC  RAPID URINE DRUG SCREEN, HOSP PERFORMED  ACETAMINOPHEN LEVEL  SALICYLATE LEVEL  I-STAT BETA HCG BLOOD, ED ([facility], [facility], [facility] ONLY)  I-STAT CHEM 8, ED  CBG MONITORING, ED    EKG EKG Interpretation  Date/Time:  Thursday 2022-11-27 22:58:48 EST Ventricular Rate:  72 PR Interval:  181 QRS Duration: 116 QT Interval:  431 QTC Calculation: 472 R Axis:   74 Text Interpretation: Sinus rhythm Nonspecific intraventricular conduction delay Probable anteroseptal infarct, recent Lateral leads are also involved Confirmed by Blane Ohara [phone]) on 11-27-22 11:23:34 PM  Radiology DG Chest Portable 1 View  Result Date: 2022-11-27 CLINICAL DATA:  Cardiac arrest, CPR, status post intubation. EXAM: PORTABLE CHEST 1 VIEW COMPARISON:  11/06/2022. FINDINGS: Heart is enlarged and the mediastinal contour is stable. The pulmonary vasculature is distended on the left. The  proximal esophagus is distended with air. The distal tip of the endotracheal tube terminates 3.3 cm above the carina. Mild atelectasis is present in the perihilar region on the right. No consolidation, effusion, or pneumothorax. The stomach is gas-filled and distended. No acute osseous abnormality. IMPRESSION: 1. Cardiomegaly with mildly distended pulmonary vasculature on the left. 2. Mild atelectasis in the perihilar  region on the right. 3. Endotracheal tube terminates 3.3 cm above the carina. 4. Proximal to mid esophagus and stomach are distended with gas. Electronically Signed   By: Thornell Sartorius M.D.   On: 11/05/2022 23:26    Procedures .Critical Care  Performed by: Paris Lore, PA-C Authorized by: Paris Lore, PA-C   Critical care provider statement:    Critical care time (minutes):  45   Critical care was time spent personally by me on the following activities:  Development of treatment plan with patient or surrogate, discussions with consultants, evaluation of patient's response to treatment, examination of patient, obtaining history from patient or surrogate, ordering and performing treatments and interventions, ordering and review of laboratory studies, ordering and review of radiographic studies, pulse oximetry and re-evaluation of patient's condition     Medications Ordered in ED Medications  EPINEPHrine (ADRENALIN) 5 mg in NS 250 mL (0.02 mg/mL) premix infusion (10 mcg/min Intravenous New Bag/Given 11/15/2022 2252)  sodium chloride 0.9 % bolus 1,000 mL (has no administration in time range)  calcium gluconate 1 g/ 50 mL sodium chloride IVPB (1 g Intravenous New Bag/Given 11/09/2022 2307)  dextrose 50 % solution 50 mL (has no administration in time range)    ED Course/ Medical Decision Making/ A&P Clinical Course as of 11/21/2022 2353  Thu Nov 10, 2022  2325 Consult to critical care Dr. Tonia Brooms, who is agreeable to seeing this patient admitting her to his service. [RS]   2343 Critical care at the bedside. [RS]  2349 Patient escalated to epinephrine 50 mcg, now running through left tibial IO.  [RS]    Clinical Course User Index [RS] Adanya Sosinski, Eugene Gavia, PA-C                           Medical Decision Making 36 year old female whopresents undergoing CPR in progress for suspected overdose.  Please see HPI above for EMS history.  Patient received another epi upon arrival to the ED, subsequently got ROSC.  Patient with strong femoral pulses initially, however became progressively weaker and BP down trended.  Patient was initiated on epi drip currently on 20 mcgs per minute, ET tube in place with tube confirmation confirmed with chest x-ray as above.  OG tube placed for gastric distention.  Unfortunately patient's intake neurologic exam was discouraging with bilaterally dilated and fixed pupils.  Unfortunately patient likely has severe anoxic injury   I-STAT Chem-8 with hyperkalemia with K6.5, 1 g calcium gluconate administered.  Hypoglycemia with glucose less than 20 on i-STAT.  Amp of D50 administered.   At this time patient remains on epi drip with stable vital signs, consult to critical care as above, Dr. Tonia Brooms is agreeable to admitting the patient to his service.    Amount and/or Complexity of Data Reviewed Labs: ordered.    Details: ABG with pH of 6.88. Radiology: ordered.    Details: ET tube 3.3 cm above carina on CXR, vusualized by this provider.  Risk Prescription drug management.   Patient unfortunately with likely severe anoxic brain injury. She remains hypothermic, though temp improving to 92 F, on temp foley at this time. Patient placed on bairhugger, admitted to ICU for remaining neurologic evaluation and further stabilization.   This chart was dictated using voice recognition software, Dragon. Despite the best efforts of this provider to proofread and correct errors, errors may still occur which can change documentation meaning.  Final  Clinical Impression(s) / ED Diagnoses Final  diagnoses:  Cardiac arrest [facility](HCC)  History of drug abuse (HCC)  Acute renal insufficiency  Hypoglycemia  Hyperkalemia    Rx / DC Orders ED Discharge Orders     None         Sherrilee GillesSponseller, Daruis Swaim R, PA-C 06/23/22 2354    Blane OharaZavitz, Joshua, MD 11/11/22 0011

## 2022-11-10 NOTE — Consult Note (Signed)
NEUROLOGY CONSULTATION NOTE   Date of service: November 11, 2022 Patient Name: Erika Taylor MRN:  [id number] DOB:  1986/06/26 Reason for consult: "concern for severe anoxic brain injury" Requesting Provider: Josephine Igo, DO _ _ _   _ __   _ __ _ _  __ __   _ __   __ _  History of Present Illness  Erika Taylor is a 36 y.o. female with PMH significant for polysubstance use and depression who presents to the ED unresponsive and CPR in progress with high concern for overdose. She was seen by family 45 mins prior to patient being found pulseless and apneic. She received Narcan and EMS did CPR with ROSc before loss of pulse x 2. She lost pulse as she was beign unloaded at the ED ambulance bay. ETT was placed in field. She was hypothermic on arrival to 30F.  She is unresponsive despite no sedation with fixed and unreactive pupils. UDS positive for cocaine, opiates, benzos. ABG with pH of 6.863 with lactate > 9. Neurology consulted for prognostication.    ROS   Unable to obtain 2/2 intubation and comatose.  Past History   Past Medical History:  Diagnosis Date   Depression    No past surgical history on file. Family History  Adopted: Yes  Problem Relation Age of Onset   Healthy Mother    Healthy Father    Social History   Socioeconomic History   Marital status: Single    Spouse name: Not on file   Number of children: 1   Years of education: 14   Highest education level: Associate degree: academic program  Occupational History   Not on file  Tobacco Use   Smoking status: Some Days    Packs/day: 0.50    Years: 12.00    Total pack years: 6.00    Types: Cigarettes    Start date: 09/14/2015   Smokeless tobacco: Never  Vaping Use   Vaping Use: Never used  Substance and Sexual Activity   Alcohol use: Yes    Alcohol/week: 0.0 standard drinks of alcohol    Comment: social   Drug use: Not Currently    Types: Cocaine, Benzodiazepines, Barbituates, Marijuana    Comment:  heroin   Sexual activity: Yes    Birth control/protection: None  Other Topics Concern   Not on file  Social History Narrative   Not on file   Social Determinants of Health   Financial Resource Strain: Not on file  Food Insecurity: Food Insecurity Present (11/07/2022)   Hunger Vital Sign    Worried About Running Out of Food in the Last Year: Often true    Ran Out of Food in the Last Year: Often true  Transportation Needs: No Transportation Needs (11/16/2020)   PRAPARE - Administrator, Civil Service (Medical): No    Lack of Transportation (Non-Medical): No  Physical Activity: Not on file  Stress: Not on file  Social Connections: Not on file   Allergies  Allergen Reactions   Penicillins Rash    Medications  (Not in a hospital admission)    Vitals   Vitals:   11/11/22 0045 11/11/22 0100 11/11/22 0108 11/11/22 0109  BP: (!) 100/41 (!) 65/52 (!) 100/44   Pulse: 63 (!) 52  (!) 57  Resp: (!) 24 19 18 16   Temp: (!) 92.3 F (33.5 C) (!) 92.4 F (33.6 C) (!) 92.6 F (33.7 C) (!) 92.6 F (33.7 C)  SpO2: 100% 95%  96%     There is no height or weight on file to calculate BMI.  Physical Exam   General: Laying comfortably in bed; intubated. HENT: Normal oropharynx and mucosa. Normal external appearance of ears and nose.  Neck: Supple, no pain or tenderness  CV: No JVD. No peripheral edema.  Pulmonary: Symmetric Chest rise. Normal respiratory effort.  Abdomen: Soft to touch, non-tender.  Ext: No cyanosis, edema, or deformity  Skin: No rash. Normal palpation of skin.   Musculoskeletal: Normal digits and nails by inspection. No clubbing.   Neurologic Examination  Mental status/Cognition: eyes closed, no response to voice, loud clap or to noxious stimuli. Speech/language: mute, no speech, no attempts to communicate, does not follow commands. Cranial nerves:   CN II Pupils 54mm BL, no response to bright light.   CN III,IV,VI Dyscinjugate gaze with absent dolls  eye reflex. No gaze deviation or preference.   CN V Corneals absent BL   CN VII No facial grimace to noxious stimuli   CN VIII Does not turn head towards speech   CN IX & X Cough absent, gag absent   CN XI Head midline   CN XII midline tongue grossly, does not protrude on command.   Motor/sensory:  Muscle bulk: normal, tone flaccid in all extremities.  No response to proximal pinch in any of the extremities.  Reflexes:  Right Left Comments  Pectoralis      Biceps (C5/6) 1 1   Brachioradialis (C5/6) 2 2    Triceps (C6/7) 2 2    Patellar (L3/4) 2 2    Achilles (S1)      Hoffman      Plantar mute mute   Jaw jerk    Coordination/Complex Motor:  Unable to assess.  Labs   CBC:  Recent Labs  Lab 11/06/22 2138 11/06/22 2201 11/07/22 0406 11/24/2022 2256 November 24, 2022 2307 Nov 24, 2022 2345  WBC 29.5*   < > 15.6*  --  9.0  --   NEUTROABS 27.3*  --   --   --  5.3  --   HGB 13.3   < > 11.6*   < > 12.7 10.9*  HCT 40.2   < > 36.6   < > 43.3 32.0*  MCV 100.5*   < > 101.4*  --  113.9*  --   PLT 350   < > 279  --  244  --    < > = values in this interval not displayed.    Basic Metabolic Panel:  Lab Results  Component Value Date   NA 136 November 24, 2022   K 6.0 (H) 2022/11/24   CO2 12 (L) 11/24/2022   GLUCOSE <20 (LL) 11-24-2022   BUN 20 2022/11/24   CREATININE 3.21 (H) 11/24/2022   CALCIUM 7.7 (L) 2022-11-24   GFRNONAA 18 (L) 24-Nov-2022   GFRAA >60 09/14/2018   Lipid Panel:  Lab Results  Component Value Date   LDLCALC 96 01/13/2021   HgbA1c:  Lab Results  Component Value Date   HGBA1C 5.3 11/07/2022   Urine Drug Screen:     Component Value Date/Time   LABOPIA POSITIVE (A) 11/24/2022 2331   COCAINSCRNUR POSITIVE (A) Nov 24, 2022 2331   LABBENZ POSITIVE (A) 11/24/22 2331   AMPHETMU NONE DETECTED 11/24/2022 2331   THCU NONE DETECTED November 24, 2022 2331   LABBARB NONE DETECTED 11-24-2022 2331    Alcohol Level     Component Value Date/Time   ETH <10 11/07/2022 0100     CT Head without contrast: pending  MRI Brain: Will need to get between day 3-5  rEEG:  pending  Impression   Erika Taylor is a 36 y.o. female with PMH significant for polysubstance use and depression who presents to the ED in cardiac arrest, last seen 45 prior to being found unresponsive. Lost pulse x 2 before ROSC again. On arrival, absent brainstem reflexes despite not on sedation but core temp of 92, severe acidosis on ABG and UDS positive for cocaine, benzos and opiates.  High suspicion for significant anoxic injury. However, metabolic abnormalities need to be corrected before accurate prognostication can be made.  Recommendations  - STAT CTH - rEEG - MRI Brain between day 3-5. - avoid hypotension, hyperthermia and hyponatremia. - neurology will continue to follow along. ______________________________________________________________________   Thank you for the opportunity to take part in the care of this patient. If you have any further questions, please contact the neurology consultation attending.  Signed,  Erick Blinks [facility] Pager Number [phone] _ _ _   _ __   _ __ _ _  __ __   _ __   __ _

## 2022-11-10 NOTE — ED Triage Notes (Signed)
Pt in from home via [facility] as probable OD, CPR in progress. CPR start at 2208. EMS gave 20 min CPR and gained ROSC, then lost pulses as they were pulling into ED. Given Narcan by fire dept, 6 total epi, NS en route.  King airway in place

## 2022-11-11 ENCOUNTER — Inpatient Hospital Stay (HOSPITAL_COMMUNITY): Payer: Medicaid Other

## 2022-11-11 DIAGNOSIS — E871 Hypo-osmolality and hyponatremia: Secondary | ICD-10-CM | POA: Diagnosis present

## 2022-11-11 DIAGNOSIS — I2489 Other forms of acute ischemic heart disease: Secondary | ICD-10-CM | POA: Diagnosis present

## 2022-11-11 DIAGNOSIS — L89899 Pressure ulcer of other site, unspecified stage: Secondary | ICD-10-CM | POA: Diagnosis present

## 2022-11-11 DIAGNOSIS — G40901 Epilepsy, unspecified, not intractable, with status epilepticus: Secondary | ICD-10-CM | POA: Diagnosis present

## 2022-11-11 DIAGNOSIS — M6282 Rhabdomyolysis: Secondary | ICD-10-CM | POA: Diagnosis present

## 2022-11-11 DIAGNOSIS — J9601 Acute respiratory failure with hypoxia: Secondary | ICD-10-CM | POA: Diagnosis present

## 2022-11-11 DIAGNOSIS — I10 Essential (primary) hypertension: Secondary | ICD-10-CM | POA: Diagnosis present

## 2022-11-11 DIAGNOSIS — K7200 Acute and subacute hepatic failure without coma: Secondary | ICD-10-CM | POA: Diagnosis present

## 2022-11-11 DIAGNOSIS — G931 Anoxic brain damage, not elsewhere classified: Secondary | ICD-10-CM

## 2022-11-11 DIAGNOSIS — N170 Acute kidney failure with tubular necrosis: Secondary | ICD-10-CM | POA: Diagnosis present

## 2022-11-11 DIAGNOSIS — F1410 Cocaine abuse, uncomplicated: Secondary | ICD-10-CM | POA: Diagnosis present

## 2022-11-11 DIAGNOSIS — Z66 Do not resuscitate: Secondary | ICD-10-CM | POA: Diagnosis not present

## 2022-11-11 DIAGNOSIS — Z9911 Dependence on respirator [ventilator] status: Secondary | ICD-10-CM | POA: Diagnosis not present

## 2022-11-11 DIAGNOSIS — J15211 Pneumonia due to Methicillin susceptible Staphylococcus aureus: Secondary | ICD-10-CM | POA: Diagnosis not present

## 2022-11-11 DIAGNOSIS — I469 Cardiac arrest, cause unspecified: Secondary | ICD-10-CM | POA: Diagnosis present

## 2022-11-11 DIAGNOSIS — R578 Other shock: Secondary | ICD-10-CM | POA: Diagnosis present

## 2022-11-11 DIAGNOSIS — A419 Sepsis, unspecified organism: Secondary | ICD-10-CM | POA: Diagnosis not present

## 2022-11-11 DIAGNOSIS — F1110 Opioid abuse, uncomplicated: Secondary | ICD-10-CM | POA: Diagnosis present

## 2022-11-11 DIAGNOSIS — F1310 Sedative, hypnotic or anxiolytic abuse, uncomplicated: Secondary | ICD-10-CM | POA: Diagnosis present

## 2022-11-11 DIAGNOSIS — R6521 Severe sepsis with septic shock: Secondary | ICD-10-CM | POA: Diagnosis not present

## 2022-11-11 DIAGNOSIS — G9341 Metabolic encephalopathy: Secondary | ICD-10-CM | POA: Diagnosis not present

## 2022-11-11 DIAGNOSIS — Z515 Encounter for palliative care: Secondary | ICD-10-CM | POA: Diagnosis not present

## 2022-11-11 DIAGNOSIS — L89159 Pressure ulcer of sacral region, unspecified stage: Secondary | ICD-10-CM | POA: Diagnosis present

## 2022-11-11 DIAGNOSIS — I509 Heart failure, unspecified: Secondary | ICD-10-CM | POA: Diagnosis not present

## 2022-11-11 DIAGNOSIS — E874 Mixed disorder of acid-base balance: Secondary | ICD-10-CM | POA: Diagnosis present

## 2022-11-11 DIAGNOSIS — R7881 Bacteremia: Secondary | ICD-10-CM | POA: Diagnosis present

## 2022-11-11 DIAGNOSIS — D689 Coagulation defect, unspecified: Secondary | ICD-10-CM | POA: Diagnosis present

## 2022-11-11 DIAGNOSIS — T50901A Poisoning by unspecified drugs, medicaments and biological substances, accidental (unintentional), initial encounter: Secondary | ICD-10-CM | POA: Diagnosis present

## 2022-11-11 DIAGNOSIS — R9401 Abnormal electroencephalogram [EEG]: Secondary | ICD-10-CM | POA: Diagnosis not present

## 2022-11-11 DIAGNOSIS — F32A Depression, unspecified: Secondary | ICD-10-CM | POA: Diagnosis present

## 2022-11-11 LAB — COMPREHENSIVE METABOLIC PANEL
ALT: 2320 U/L — ABNORMAL HIGH (ref 0–44)
ALT: 4666 U/L — ABNORMAL HIGH (ref 0–44)
AST: 4600 U/L — ABNORMAL HIGH (ref 15–41)
AST: >10000 U/L — ABNORMAL HIGH (ref 15–41)
Albumin: 3.1 g/dL — ABNORMAL LOW (ref 3.5–5.0)
Albumin: 2.2 g/dL — ABNORMAL LOW (ref 3.5–5.0)
Alkaline Phosphatase: 56 U/L (ref 38–126)
Alkaline Phosphatase: 69 U/L (ref 38–126)
Anion gap: 27 — ABNORMAL HIGH (ref 5–15)
Anion gap: 32 — ABNORMAL HIGH (ref 5–15)
BUN: 20 mg/dL (ref 6–20)
BUN: 23 mg/dL — ABNORMAL HIGH (ref 6–20)
CO2: 12 mmol/L — ABNORMAL LOW (ref 22–32)
CO2: 12 mmol/L — ABNORMAL LOW (ref 22–32)
Calcium: 7.7 mg/dL — ABNORMAL LOW (ref 8.9–10.3)
Calcium: 8.8 mg/dL — ABNORMAL LOW (ref 8.9–10.3)
Chloride: 103 mmol/L (ref 98–111)
Chloride: 101 mmol/L (ref 98–111)
Creatinine, Ser: 3.21 mg/dL — ABNORMAL HIGH (ref 0.44–1.00)
Creatinine, Ser: 4.36 mg/dL — ABNORMAL HIGH (ref 0.44–1.00)
GFR, Estimated: 18 mL/min — ABNORMAL LOW (ref >60)
GFR, Estimated: 13 mL/min — ABNORMAL LOW (ref >60)
Glucose, Bld: <20 mg/dL — CL (ref 70–99)
Glucose, Bld: 278 mg/dL — ABNORMAL HIGH (ref 70–99)
Potassium: 6.8 mmol/L (ref 3.5–5.1)
Potassium: 4.8 mmol/L (ref 3.5–5.1)
Sodium: 142 mmol/L (ref 135–145)
Sodium: 145 mmol/L (ref 135–145)
Total Bilirubin: 0.7 mg/dL (ref 0.3–1.2)
Total Bilirubin: 1.7 mg/dL — ABNORMAL HIGH (ref 0.3–1.2)
Total Protein: 5.7 g/dL — ABNORMAL LOW (ref 6.5–8.1)
Total Protein: 4 g/dL — ABNORMAL LOW (ref 6.5–8.1)

## 2022-11-11 LAB — I-STAT ARTERIAL BLOOD GAS, ED
Acid-base deficit: 19 mmol/L — ABNORMAL HIGH (ref 0.0–2.0)
Bicarbonate: 9.8 mmol/L — ABNORMAL LOW (ref 20.0–28.0)
Calcium, Ion: 0.8 mmol/L — CL (ref 1.15–1.40)
HCT: 29 % — ABNORMAL LOW (ref 36.0–46.0)
Hemoglobin: 9.9 g/dL — ABNORMAL LOW (ref 12.0–15.0)
O2 Saturation: 99 %
Patient temperature: 92.7
Potassium: 5.4 mmol/L — ABNORMAL HIGH (ref 3.5–5.1)
Sodium: 141 mmol/L (ref 135–145)
TCO2: 11 mmol/L — ABNORMAL LOW (ref 22–32)
pCO2 arterial: 30.4 mmHg — ABNORMAL LOW (ref 32–48)
pH, Arterial: 7.095 — CL (ref 7.35–7.45)
pO2, Arterial: 199 mmHg — ABNORMAL HIGH (ref 83–108)

## 2022-11-11 LAB — POCT I-STAT 7, (LYTES, BLD GAS, ICA,H+H)
Acid-base deficit: 17 mmol/L — ABNORMAL HIGH (ref 0.0–2.0)
Bicarbonate: 10.2 mmol/L — ABNORMAL LOW (ref 20.0–28.0)
Calcium, Ion: 0.71 mmol/L — CL (ref 1.15–1.40)
HCT: 34 % — ABNORMAL LOW (ref 36.0–46.0)
Hemoglobin: 11.6 g/dL — ABNORMAL LOW (ref 12.0–15.0)
O2 Saturation: 100 %
Patient temperature: 98.8
Potassium: 5.1 mmol/L (ref 3.5–5.1)
Sodium: 139 mmol/L (ref 135–145)
TCO2: 11 mmol/L — ABNORMAL LOW (ref 22–32)
pCO2 arterial: 29 mmHg — ABNORMAL LOW (ref 32–48)
pH, Arterial: 7.155 — CL (ref 7.35–7.45)
pO2, Arterial: 246 mmHg — ABNORMAL HIGH (ref 83–108)

## 2022-11-11 LAB — I-STAT CHEM 8, ED
BUN: 28 mg/dL — ABNORMAL HIGH (ref 6–20)
Calcium, Ion: 0.76 mmol/L — CL (ref 1.15–1.40)
Chloride: 107 mmol/L (ref 98–111)
Creatinine, Ser: 3.1 mg/dL — ABNORMAL HIGH (ref 0.44–1.00)
Glucose, Bld: 156 mg/dL — ABNORMAL HIGH (ref 70–99)
HCT: 37 % (ref 36.0–46.0)
Hemoglobin: 12.6 g/dL (ref 12.0–15.0)
Potassium: 5.6 mmol/L — ABNORMAL HIGH (ref 3.5–5.1)
Sodium: 139 mmol/L (ref 135–145)
TCO2: 12 mmol/L — ABNORMAL LOW (ref 22–32)

## 2022-11-11 LAB — URINALYSIS, ROUTINE W REFLEX MICROSCOPIC
Bilirubin Urine: NEGATIVE
Glucose, UA: >=500 mg/dL — AB
Hgb urine dipstick: NEGATIVE
Ketones, ur: NEGATIVE mg/dL
Nitrite: NEGATIVE
Protein, ur: 30 mg/dL — AB
Specific Gravity, Urine: 1.017 (ref 1.005–1.030)
WBC, UA: >50 WBC/hpf — ABNORMAL HIGH (ref 0–5)
pH: 5 (ref 5.0–8.0)

## 2022-11-11 LAB — BLOOD GAS, ARTERIAL
Acid-base deficit: 17.8 mmol/L — ABNORMAL HIGH (ref 0.0–2.0)
Bicarbonate: 11.2 mmol/L — ABNORMAL LOW (ref 20.0–28.0)
Drawn by: 33176
O2 Saturation: 96.7 %
Patient temperature: 37.3
pCO2 arterial: 37 mmHg (ref 32–48)
pH, Arterial: 7.09 — CL (ref 7.35–7.45)
pO2, Arterial: 97 mmHg (ref 83–108)

## 2022-11-11 LAB — RENAL FUNCTION PANEL
Albumin: 2.6 g/dL — ABNORMAL LOW (ref 3.5–5.0)
Anion gap: 33 — ABNORMAL HIGH (ref 5–15)
BUN: 24 mg/dL — ABNORMAL HIGH (ref 6–20)
CO2: 14 mmol/L — ABNORMAL LOW (ref 22–32)
Calcium: 6.7 mg/dL — ABNORMAL LOW (ref 8.9–10.3)
Chloride: 95 mmol/L — ABNORMAL LOW (ref 98–111)
Creatinine, Ser: 4.32 mg/dL — ABNORMAL HIGH (ref 0.44–1.00)
GFR, Estimated: 13 mL/min — ABNORMAL LOW (ref >60)
Glucose, Bld: 250 mg/dL — ABNORMAL HIGH (ref 70–99)
Phosphorus: 9.9 mg/dL — ABNORMAL HIGH (ref 2.5–4.6)
Potassium: 3.9 mmol/L (ref 3.5–5.1)
Sodium: 142 mmol/L (ref 135–145)

## 2022-11-11 LAB — DIC (DISSEMINATED INTRAVASCULAR COAGULATION)PANEL
D-Dimer, Quant: >20 ug/mL-FEU — ABNORMAL HIGH (ref 0.00–0.50)
D-Dimer, Quant: >20 ug/mL-FEU — ABNORMAL HIGH (ref 0.00–0.50)
Fibrinogen: 121 mg/dL — ABNORMAL LOW (ref 210–475)
Fibrinogen: 111 mg/dL — ABNORMAL LOW (ref 210–475)
INR: 5.2 (ref 0.8–1.2)
INR: 4.7 (ref 0.8–1.2)
Platelets: 187 10*3/uL (ref 150–400)
Platelets: 175 10*3/uL (ref 150–400)
Prothrombin Time: 47.6 seconds — ABNORMAL HIGH (ref 11.4–15.2)
Prothrombin Time: 44 seconds — ABNORMAL HIGH (ref 11.4–15.2)
Smear Review: NONE SEEN
Smear Review: NONE SEEN
aPTT: 52 seconds — ABNORMAL HIGH (ref 24–36)
aPTT: 52 seconds — ABNORMAL HIGH (ref 24–36)

## 2022-11-11 LAB — CBC
HCT: 43 % (ref 36.0–46.0)
HCT: 34.3 % — ABNORMAL LOW (ref 36.0–46.0)
Hemoglobin: 12.5 g/dL (ref 12.0–15.0)
Hemoglobin: 10.2 g/dL — ABNORMAL LOW (ref 12.0–15.0)
MCH: 32.3 pg (ref 26.0–34.0)
MCH: 32.2 pg (ref 26.0–34.0)
MCHC: 29.1 g/dL — ABNORMAL LOW (ref 30.0–36.0)
MCHC: 29.7 g/dL — ABNORMAL LOW (ref 30.0–36.0)
MCV: 111.1 fL — ABNORMAL HIGH (ref 80.0–100.0)
MCV: 108.2 fL — ABNORMAL HIGH (ref 80.0–100.0)
Platelets: 174 10*3/uL (ref 150–400)
Platelets: 190 10*3/uL (ref 150–400)
RBC: 3.87 MIL/uL (ref 3.87–5.11)
RBC: 3.17 MIL/uL — ABNORMAL LOW (ref 3.87–5.11)
RDW: 11.7 % (ref 11.5–15.5)
RDW: 11.8 % (ref 11.5–15.5)
WBC: 17.9 10*3/uL — ABNORMAL HIGH (ref 4.0–10.5)
WBC: 16.9 10*3/uL — ABNORMAL HIGH (ref 4.0–10.5)
nRBC: 1.4 % — ABNORMAL HIGH (ref 0.0–0.2)
nRBC: 0.3 % — ABNORMAL HIGH (ref 0.0–0.2)

## 2022-11-11 LAB — POCT I-STAT EG7
Acid-base deficit: 14 mmol/L — ABNORMAL HIGH (ref 0.0–2.0)
Bicarbonate: 13.8 mmol/L — ABNORMAL LOW (ref 20.0–28.0)
Calcium, Ion: 0.68 mmol/L — CL (ref 1.15–1.40)
HCT: 31 % — ABNORMAL LOW (ref 36.0–46.0)
Hemoglobin: 10.5 g/dL — ABNORMAL LOW (ref 12.0–15.0)
O2 Saturation: 73 %
Patient temperature: 98.8
Potassium: 4.9 mmol/L (ref 3.5–5.1)
Sodium: 141 mmol/L (ref 135–145)
TCO2: 15 mmol/L — ABNORMAL LOW (ref 22–32)
pCO2, Ven: 41 mmHg — ABNORMAL LOW (ref 44–60)
pH, Ven: 7.137 — CL (ref 7.25–7.43)
pO2, Ven: 50 mmHg — ABNORMAL HIGH (ref 32–45)

## 2022-11-11 LAB — BASIC METABOLIC PANEL
Anion gap: 28 — ABNORMAL HIGH (ref 5–15)
Anion gap: 31 — ABNORMAL HIGH (ref 5–15)
BUN: 19 mg/dL (ref 6–20)
BUN: 25 mg/dL — ABNORMAL HIGH (ref 6–20)
CO2: 10 mmol/L — ABNORMAL LOW (ref 22–32)
CO2: 10 mmol/L — ABNORMAL LOW (ref 22–32)
Calcium: 6.5 mg/dL — ABNORMAL LOW (ref 8.9–10.3)
Calcium: 6.4 mg/dL — CL (ref 8.9–10.3)
Chloride: 106 mmol/L (ref 98–111)
Chloride: 100 mmol/L (ref 98–111)
Creatinine, Ser: 3.08 mg/dL — ABNORMAL HIGH (ref 0.44–1.00)
Creatinine, Ser: 4.51 mg/dL — ABNORMAL HIGH (ref 0.44–1.00)
GFR, Estimated: 19 mL/min — ABNORMAL LOW (ref >60)
GFR, Estimated: 12 mL/min — ABNORMAL LOW (ref >60)
Glucose, Bld: 79 mg/dL (ref 70–99)
Glucose, Bld: 336 mg/dL — ABNORMAL HIGH (ref 70–99)
Potassium: 5.5 mmol/L — ABNORMAL HIGH (ref 3.5–5.1)
Potassium: 4.4 mmol/L (ref 3.5–5.1)
Sodium: 144 mmol/L (ref 135–145)
Sodium: 141 mmol/L (ref 135–145)

## 2022-11-11 LAB — GLUCOSE, CAPILLARY
Glucose-Capillary: 203 mg/dL — ABNORMAL HIGH (ref 70–99)
Glucose-Capillary: 243 mg/dL — ABNORMAL HIGH (ref 70–99)
Glucose-Capillary: 276 mg/dL — ABNORMAL HIGH (ref 70–99)
Glucose-Capillary: 241 mg/dL — ABNORMAL HIGH (ref 70–99)
Glucose-Capillary: 180 mg/dL — ABNORMAL HIGH (ref 70–99)
Glucose-Capillary: 133 mg/dL — ABNORMAL HIGH (ref 70–99)
Glucose-Capillary: 121 mg/dL — ABNORMAL HIGH (ref 70–99)

## 2022-11-11 LAB — LACTIC ACID, PLASMA
Lactic Acid, Venous: >9 mmol/L (ref 0.5–1.9)
Lactic Acid, Venous: >9 mmol/L (ref 0.5–1.9)
Lactic Acid, Venous: >9 mmol/L (ref 0.5–1.9)
Lactic Acid, Venous: >9 mmol/L (ref 0.5–1.9)
Lactic Acid, Venous: >9 mmol/L (ref 0.5–1.9)

## 2022-11-11 LAB — RAPID URINE DRUG SCREEN, HOSP PERFORMED
Amphetamines: NOT DETECTED
Barbiturates: NOT DETECTED
Benzodiazepines: POSITIVE — AB
Cocaine: POSITIVE — AB
Opiates: POSITIVE — AB
Tetrahydrocannabinol: NOT DETECTED

## 2022-11-11 LAB — PHOSPHORUS
Phosphorus: >30 mg/dL — ABNORMAL HIGH (ref 2.5–4.6)
Phosphorus: >30 mg/dL — ABNORMAL HIGH (ref 2.5–4.6)

## 2022-11-11 LAB — MRSA NEXT GEN BY PCR, NASAL: MRSA by PCR Next Gen: NOT DETECTED

## 2022-11-11 LAB — SALICYLATE LEVEL: Salicylate Lvl: 9 mg/dL (ref 7.0–30.0)

## 2022-11-11 LAB — CBG MONITORING, ED: Glucose-Capillary: 76 mg/dL (ref 70–99)

## 2022-11-11 LAB — TYPE AND SCREEN
ABO/RH(D): A POS
Antibody Screen: NEGATIVE

## 2022-11-11 LAB — ABO/RH: ABO/RH(D): A POS

## 2022-11-11 LAB — CK: Total CK: 7421 U/L — ABNORMAL HIGH (ref 38–234)

## 2022-11-11 LAB — MAGNESIUM
Magnesium: 3 mg/dL — ABNORMAL HIGH (ref 1.7–2.4)
Magnesium: 2.5 mg/dL — ABNORMAL HIGH (ref 1.7–2.4)

## 2022-11-11 LAB — ACETAMINOPHEN LEVEL: Acetaminophen (Tylenol), Serum: <10 ug/mL — ABNORMAL LOW (ref 10–30)

## 2022-11-11 LAB — I-STAT BETA HCG BLOOD, ED (MC, WL, AP ONLY): I-stat hCG, quantitative: <5 m[IU]/mL (ref <5)

## 2022-11-11 MED ORDER — SODIUM BICARBONATE 8.4 % IV SOLN
INTRAVENOUS | Status: AC
  Administered 2022-11-11: 100 meq
  Filled 2022-11-11: qty 100

## 2022-11-11 MED ORDER — NOREPINEPHRINE 16 MG/250ML-% IV SOLN
0.0000 ug/min | INTRAVENOUS | Status: DC
  Administered 2022-11-11: 29 ug/min via INTRAVENOUS
  Administered 2022-11-11: 44 ug/min via INTRAVENOUS
  Administered 2022-11-12: 12 ug/min via INTRAVENOUS
  Administered 2022-11-13 – 2022-11-14 (×2): 13 ug/min via INTRAVENOUS
  Filled 2022-11-11 (×5): qty 250

## 2022-11-11 MED ORDER — CALCIUM CHLORIDE 10 % IV SOLN
INTRAVENOUS | Status: AC
  Administered 2022-11-11: 1000 mg
  Filled 2022-11-11: qty 10

## 2022-11-11 MED ORDER — SODIUM CHLORIDE 0.9 % IV SOLN: INTRAVENOUS | Status: DC | PRN

## 2022-11-11 MED ORDER — PRISMASOL BGK 4/2.5 32-4-2.5 MEQ/L REPLACEMENT SOLN
Status: DC
  Filled 2022-11-11 (×6): qty 5000

## 2022-11-11 MED ORDER — SODIUM CHLORIDE 0.9% IV SOLUTION: Freq: Once | INTRAVENOUS | Status: AC

## 2022-11-11 MED ORDER — CHLORHEXIDINE GLUCONATE CLOTH 2 % EX PADS
6.0000 | MEDICATED_PAD | Freq: Every day | CUTANEOUS | Status: DC
  Administered 2022-11-11 – 2022-11-14 (×4): 6 via TOPICAL

## 2022-11-11 MED ORDER — VANCOMYCIN HCL 2000 MG/400ML IV SOLN
2000.0000 mg | Freq: Once | INTRAVENOUS | Status: AC
  Administered 2022-11-11: 2000 mg via INTRAVENOUS
  Filled 2022-11-11: qty 400

## 2022-11-11 MED ORDER — SODIUM CHLORIDE 0.9 % IV SOLN
2.0000 g | Freq: Once | INTRAVENOUS | Status: AC
  Administered 2022-11-11: 2 g via INTRAVENOUS
  Filled 2022-11-11: qty 12.5

## 2022-11-11 MED ORDER — SODIUM BICARBONATE 8.4 % IV SOLN
INTRAVENOUS | Status: AC
  Filled 2022-11-11: qty 50

## 2022-11-11 MED ORDER — INSULIN ASPART 100 UNIT/ML IJ SOLN
0.0000 [IU] | INTRAMUSCULAR | Status: DC
  Administered 2022-11-11: 2 [IU] via SUBCUTANEOUS
  Administered 2022-11-11: 3 [IU] via SUBCUTANEOUS
  Administered 2022-11-11: 1 [IU] via SUBCUTANEOUS

## 2022-11-11 MED ORDER — CALCIUM CHLORIDE 10 % IV SOLN
INTRAVENOUS | Status: AC
  Filled 2022-11-11: qty 10

## 2022-11-11 MED ORDER — PANTOPRAZOLE SODIUM 40 MG IV SOLR
40.0000 mg | Freq: Every day | INTRAVENOUS | Status: DC
  Administered 2022-11-11 – 2022-11-13 (×4): 40 mg via INTRAVENOUS
  Filled 2022-11-11 (×4): qty 10

## 2022-11-11 MED ORDER — PRISMASOL BGK 4/2.5 32-4-2.5 MEQ/L EC SOLN
Status: DC
  Filled 2022-11-11 (×16): qty 5000

## 2022-11-11 MED ORDER — VANCOMYCIN HCL 1750 MG/350ML IV SOLN
1750.0000 mg | INTRAVENOUS | Status: DC
  Filled 2022-11-11: qty 350

## 2022-11-11 MED ORDER — HEPARIN SODIUM (PORCINE) 1000 UNIT/ML DIALYSIS: 1000.0000 [IU] | INTRAMUSCULAR | Status: DC | PRN

## 2022-11-11 MED ORDER — SODIUM BICARBONATE 8.4 % IV SOLN: 100.0000 meq | Freq: Once | INTRAVENOUS | Status: AC

## 2022-11-11 MED ORDER — ORAL CARE MOUTH RINSE
15.0000 mL | OROMUCOSAL | Status: DC
  Administered 2022-11-11 – 2022-11-14 (×40): 15 mL via OROMUCOSAL

## 2022-11-11 MED ORDER — PRISMASOL BGK 0/2.5 32-2.5 MEQ/L EC SOLN
Status: DC
  Filled 2022-11-11 (×2): qty 5000

## 2022-11-11 MED ORDER — VITAMIN K1 10 MG/ML IJ SOLN
10.0000 mg | Freq: Once | INTRAVENOUS | Status: AC
  Administered 2022-11-11: 10 mg via INTRAVENOUS
  Filled 2022-11-11 (×2): qty 1

## 2022-11-11 MED ORDER — SODIUM BICARBONATE 8.4 % IV SOLN: 100.0000 meq | Freq: Once | INTRAVENOUS | Status: DC

## 2022-11-11 MED ORDER — POLYETHYLENE GLYCOL 3350 17 G PO PACK: 17.0000 g | PACK | Freq: Every day | ORAL | Status: DC | PRN

## 2022-11-11 MED ORDER — HEPARIN SODIUM (PORCINE) 5000 UNIT/ML IJ SOLN
5000.0000 [IU] | Freq: Three times a day (TID) | INTRAMUSCULAR | Status: DC
  Administered 2022-11-11: 5000 [IU] via SUBCUTANEOUS
  Filled 2022-11-11: qty 1

## 2022-11-11 MED ORDER — SODIUM BICARBONATE 8.4 % IV SOLN
50.0000 meq | Freq: Once | INTRAVENOUS | Status: AC
  Administered 2022-11-11: 50 meq via INTRAVENOUS

## 2022-11-11 MED ORDER — DEXTROSE 50 % IV SOLN
INTRAVENOUS | Status: AC
  Administered 2022-11-11: 1 via INTRAVENOUS
  Filled 2022-11-11: qty 50

## 2022-11-11 MED ORDER — NOREPINEPHRINE 4 MG/250ML-% IV SOLN
0.0000 ug/min | INTRAVENOUS | Status: DC
  Administered 2022-11-11: 34 ug/min via INTRAVENOUS
  Administered 2022-11-11: 33 ug/min via INTRAVENOUS
  Administered 2022-11-11: 2 ug/min via INTRAVENOUS
  Administered 2022-11-11: 35 ug/min via INTRAVENOUS
  Administered 2022-11-11: 30 ug/min via INTRAVENOUS
  Filled 2022-11-11 (×7): qty 250

## 2022-11-11 MED ORDER — DOCUSATE SODIUM 100 MG PO CAPS: 100.0000 mg | ORAL_CAPSULE | Freq: Two times a day (BID) | ORAL | Status: DC | PRN

## 2022-11-11 MED ORDER — SODIUM CHLORIDE 0.9 % FOR CRRT: INTRAVENOUS_CENTRAL | Status: DC | PRN

## 2022-11-11 MED ORDER — SODIUM CHLORIDE 0.9% FLUSH: 10.0000 mL | INTRAVENOUS | Status: DC | PRN

## 2022-11-11 MED ORDER — SODIUM BICARBONATE 8.4 % IV SOLN
INTRAVENOUS | Status: DC | PRN
  Administered 2022-11-10 – 2022-11-11 (×4): 50 meq via INTRAVENOUS

## 2022-11-11 MED ORDER — PHENYLEPHRINE 80 MCG/ML (10ML) SYRINGE FOR IV PUSH (FOR BLOOD PRESSURE SUPPORT)
PREFILLED_SYRINGE | INTRAVENOUS | Status: AC
  Administered 2022-11-11: 800 ug
  Filled 2022-11-11: qty 10

## 2022-11-11 MED ORDER — STERILE WATER FOR INJECTION IV SOLN
INTRAVENOUS | Status: DC
  Filled 2022-11-11 (×4): qty 1000

## 2022-11-11 MED ORDER — CALCIUM GLUCONATE-NACL 1-0.675 GM/50ML-% IV SOLN
1.0000 g | Freq: Once | INTRAVENOUS | Status: AC
  Administered 2022-11-11: 1000 mg via INTRAVENOUS
  Filled 2022-11-11: qty 50

## 2022-11-11 MED ORDER — SODIUM CHLORIDE 0.9 % IV SOLN
2.0000 g | Freq: Two times a day (BID) | INTRAVENOUS | Status: DC
  Administered 2022-11-11 – 2022-11-13 (×5): 2 g via INTRAVENOUS
  Filled 2022-11-11 (×6): qty 12.5

## 2022-11-11 MED ORDER — HYDROCORTISONE SOD SUC (PF) 100 MG IJ SOLR
100.0000 mg | Freq: Three times a day (TID) | INTRAMUSCULAR | Status: DC
  Administered 2022-11-11 – 2022-11-14 (×9): 100 mg via INTRAVENOUS
  Filled 2022-11-11 (×10): qty 2

## 2022-11-11 MED ORDER — CALCIUM CHLORIDE 10 % IV SOLN
1.0000 g | Freq: Once | INTRAVENOUS | Status: AC
  Administered 2022-11-11: 1 g via INTRAVENOUS

## 2022-11-11 MED ORDER — ORAL CARE MOUTH RINSE: 15.0000 mL | OROMUCOSAL | Status: DC | PRN

## 2022-11-11 MED ORDER — PRISMASOL BGK 0/2.5 32-2.5 MEQ/L EC SOLN
Status: DC
  Filled 2022-11-11 (×8): qty 5000

## 2022-11-11 MED ORDER — VASOPRESSIN 20 UNITS/100 ML INFUSION FOR SHOCK
0.0000 [IU]/min | INTRAVENOUS | Status: DC
  Administered 2022-11-11: 0.04 [IU]/min via INTRAVENOUS
  Administered 2022-11-11: 0.03 [IU]/min via INTRAVENOUS
  Administered 2022-11-11 – 2022-11-14 (×8): 0.04 [IU]/min via INTRAVENOUS
  Filled 2022-11-11 (×10): qty 100

## 2022-11-11 MED ORDER — SODIUM CHLORIDE 0.9% FLUSH
10.0000 mL | Freq: Two times a day (BID) | INTRAVENOUS | Status: DC
  Administered 2022-11-11 – 2022-11-13 (×5): 10 mL

## 2022-11-11 MED ORDER — STERILE WATER FOR INJECTION IV SOLN
INTRAVENOUS | Status: DC
  Filled 2022-11-11 (×6): qty 150

## 2022-11-11 NOTE — Progress Notes (Signed)
vLTM maintenance   All impedances below 10kohms.  No skin breakdown noted at all skin sites 

## 2022-11-11 NOTE — Progress Notes (Signed)
eLink Physician-Brief Progress Note Patient Name: Erika Taylor DOB: [birth year] MRN: [id number]   Date of Service  11/11/2022  HPI/Events of Note  Nursing request for A-line and central venous line. Nursing questioning accuracy of BP by cuff. Patient is on Epinephrine and Norepinephrine via IO line.  eICU Interventions  Plan: RT to place A-line. Will forward request for central venous line to PCCM ground team.      Intervention Category Major Interventions: Hypotension - evaluation and management  Yobani Schertzer Dennard Nip 11/11/2022, 5:55 AM

## 2022-11-11 NOTE — ED Notes (Signed)
EEG tech performing EEG at bedside

## 2022-11-11 NOTE — Progress Notes (Signed)
Pharmacy Antibiotic Note  Erika Taylor is a 36 y.o. female admitted on 11-Nov-2022 s/p OHCA with unknown down time (possibly >/= 45 minutes) now with sepsis of unclear source. WBC 16.9/afeb. Pharmacy has been consulted for vancomycin and cefepime dosing. Patient is to be started on CRRT today (12/8).  Plan: Vancomycin 2g IV x1 then vancomycin 1750mg  q24h -vancomycin levels PRN per protocol -goal vancomycin trough 15-20 mcg/mL Cefepime 2g IV q12h F/u cultures, renal replacement therapies, clinical picture/GOC    Weight: 91.4 kg (201 lb 8 oz)  Temp (24hrs), Avg:94.9 F (34.9 C), Min:92.1 F (33.4 C), Max:99 F (37.2 C)  Recent Labs  Lab 11/07/22 0017 11/07/22 0019 11/07/22 0406 11/07/22 0740 11/11/22 2256 2022/11/11 2307 11/11/22 0123 11/11/22 0255 11/11/22 0311 11/11/22 0859  WBC 19.8*  --  15.6*  --   --  9.0  --  17.9*  --  16.9*  CREATININE 0.94  --  0.93  --  2.90* 3.21* 3.08*  --  3.10* 4.36*  LATICACIDVEN  --    < > 2.4* 1.6  --  >9.0* >9.0*  --   --  >9.0*   < > = values in this interval not displayed.    Estimated Creatinine Clearance: 21.5 mL/min (A) (by C-G formula based on SCr of 4.36 mg/dL (H)).    Allergies  Allergen Reactions   Penicillins Rash    Antimicrobials this admission: Vanc 12/8> Cefepime 12/8>  Dose adjustments this admission:   Microbiology results: 12/8 BCx:  12/8 resp cx:   12/8 MRSA PCR: neg -- Prior admit: 12/4 Ucx: staph epidermidis (MRSE) 12/3 Bcx: 1/2 strep mitis/oralis   Thank you for allowing pharmacy to be a part of this patient's care.  14/3, PharmD Clinical Pharmacist 11/11/2022 12:15 PM

## 2022-11-11 NOTE — Progress Notes (Signed)
NAME:  Markeeta Scalf, MRN:  [id number], DOB:  June 19, 1986, LOS: 0 ADMISSION DATE:  2022/12/05, CONSULTATION DATE:  11/11/2022 REFERRING MD: Alvy Beal, PA, CHIEF COMPLAINT: Cardiac arrest  History of Present Illness:  This is a 36 year old female, past medical history of depression, arrived via EMS, known history of polysubstance use.  Recently admitted 12/3-12/4 with acute overdose, narcan responsive.   Patient was last seen by family at least around 45 minutes prior to being found unresponsive pulseless and apneic.  She was given Narcan by the fire department and CPR was initiated at 2145 by EMS.  She obtained ROSC but lost pulse x 2.  She was brought to to the emergency department she had a left tibial IO placed with epinephrine infusing.   Pertinent  Medical History  Polysubstance Abuse  Depression   Significant Hospital Events: Including procedures, antibiotic start and stop dates in addition to other pertinent events   12/7 > Presents to ED post-cardiac arrest. Overdose   Interim History / Subjective:  This AM on 40 mcg/min Levophed and 20 EPI. Overbreathing vent, remains with poor neuro examination otherwise.   Objective   Blood pressure (!) 158/133, pulse 82, temperature 98.6 F (37 C), resp. rate 16, weight 91.4 kg, SpO2 96 %.    Vent Mode: PRVC FiO2 (%):  [50 %-100 %] 50 % Set Rate:  [26 bmp-30 bmp] 30 bmp Vt Set:  [500 mL-530 mL] 530 mL PEEP:  [5 cmH20] 5 cmH20 Plateau Pressure:  [22 cmH20] 22 cmH20   Intake/Output Summary (Last 24 hours) at 11/11/2022 1052 Last data filed at 11/11/2022 0900 Gross per 24 hour  Intake 1379.05 ml  Output 331 ml  Net 1048.05 ml   Filed Weights   11/11/22 0355  Weight: 91.4 kg    Examination: General: Critically ill young female on vent  HENT: ETT, OG in place >> with black output  Lungs: coarse breath sounds with mild accessory muscle use  Cardiovascular: Regular rate rhythm, S1-S2 Abdomen: distended, soft, active bowel sounds   Extremities: -edema, left tibial IO Neuro: unresponsive. Pupils 5 mm and non-reactive, no cough, no gag  GU: foley in place   Resolved Hospital Problem list     Assessment & Plan:   Acute metabolic encephalopathy multifactorial in setting of acute drug overdose, post-cardiac arrest with concern for severe anoxic brain injury, +severe metabolic derangements  History of polysubstance abuse -UDS + Opiates, Cocaine, Benzo -CT head with no acute  Plan  - LTM in place >> read this AM with severe encephalopathy no seizures  - Neurology Following  - Treatment as below   Profound vasoplegic shock -Recent BC +STREPTOCOCCUS MITIS/ORALIS  Cardiac Arrest with unclear down-time in setting of acute overdose  Plan - Cardiac Monitoring  - Continue Vasopressors and wean for MAP goal >65  - Add Vasopressin  - PAN Culture  - Start Cefepime/Vancomycin   Respiratory Insufficieny in setting of post cardiac arrest, encephalopathy  Plan - Continue Vent support >> No plans for PS today given poor neuro examination and critical shock state - Trend ABG/CXR   Severe Metabolic Acidosis, Lactic Acidosis, Hyperkalemia, AKI Plan  - Consult Nephrology  - Give 2 amps bicarb and Calcium Cl now  - Continue Bicarb gtt  - Trend BMP and Lactic Acid  - Obtain CK   Liver Shock in setting of cardiac arrest  Plan - Trend LFT - Send DIC panel   Best Practice (right click and "Reselect all SmartList Selections" daily)   Diet/type:  NPO DVT prophylaxis: prophylactic heparin  GI prophylaxis: PPI Lines: Central line, Dialysis Catheter, and Arterial Line Foley:  Yes, and it is still needed Code Status:  full code Last date of multidisciplinary goals of care discussion have attempted multiple times to call family with no answer.   Labs   CBC: Recent Labs  Lab 11/06/22 2138 11/06/22 2201 11/07/22 0017 11/07/22 0406 11/26/2022 2256 11/06/2022 2307 12/04/2022 2345 11/11/22 0114 11/11/22 0255 11/11/22 0311  11/11/22 0859  WBC 29.5*  --  19.8* 15.6*  --  9.0  --   --  17.9*  --  16.9*  NEUTROABS 27.3*  --   --   --   --  5.3  --   --   --   --   --   HGB 13.3   < > 12.4 11.6*   < > 12.7 10.9* 9.9* 12.5 12.6 10.2*  HCT 40.2   < > 37.3 36.6   < > 43.3 32.0* 29.0* 43.0 37.0 34.3*  MCV 100.5*  --  100.3* 101.4*  --  113.9*  --   --  111.1*  --  108.2*  PLT 350  --  310 279  --  244  --   --  174  --  187  190   < > = values in this interval not displayed.    Basic Metabolic Panel: Recent Labs  Lab 11/06/22 2138 11/06/22 2201 11/06/22 2328 11/07/22 0017 11/07/22 0406 12/02/2022 2256 11/05/2022 2307 11/05/2022 2345 11/11/22 0114 11/11/22 0123 11/11/22 0255 11/11/22 0311  NA 136   < >  --   --  138 137 142 136 141 144  --  139  K 5.5*   < >  --   --  4.0 6.5* 6.8* 6.0* 5.4* 5.5*  --  5.6*  CL 108  --   --   --  105 108 103  --   --  106  --  107  CO2 18*  --   --   --  25  --  12*  --   --  10*  --   --   GLUCOSE 313*  --   --   --  97 <20* <20*  --   --  79  --  156*  BUN 10  --   --   --  9 30* 20  --   --  19  --  28*  CREATININE 1.09*  --   --    < > 0.93 2.90* 3.21*  --   --  3.08*  --  3.10*  CALCIUM 8.6*  --   --   --  9.0  --  7.7*  --   --  6.5*  --   --   MG  --   --  2.1  --   --   --   --   --   --   --  3.0*  --   PHOS  --   --   --   --   --   --   --   --   --   --  >30.0*  --    < > = values in this interval not displayed.   GFR: Estimated Creatinine Clearance: 30.2 mL/min (A) (by C-G formula based on SCr of 3.1 mg/dL (H)). Recent Labs  Lab 11/06/22 2328 11/07/22 0017 11/07/22 0406 11/07/22 0740 11/12/2022 2307 11/11/22 0123 11/11/22 0255 11/11/22 0859  PROCALCITON 1.64  --   --   --   --   --   --   --  WBC  --    < > 15.6*  --  9.0  --  17.9* 16.9*  LATICACIDVEN  --    < > 2.4* 1.6 >9.0* >9.0*  --  >9.0*   < > = values in this interval not displayed.    Liver Function Tests: Recent Labs  Lab 11/06/22 2138 11/07/22 0406 11/07/2022 2307  AST 31 24 4,600*   ALT 18 16 2,320*  ALKPHOS 49 38 56  BILITOT 0.2* 0.4 0.7  PROT 6.4* 6.3* 5.7*  ALBUMIN 3.7 3.5 3.1*   No results for input(s): "LIPASE", "AMYLASE" in the last 168 hours. Recent Labs  Lab 11/07/22 0100  AMMONIA 23    ABG    Component Value Date/Time   PHART 7.095 (LL) 11/11/2022 0114   PCO2ART 30.4 (L) 11/11/2022 0114   PO2ART 199 (H) 11/11/2022 0114   HCO3 9.8 (L) 11/11/2022 0114   TCO2 12 (L) 11/11/2022 0311   ACIDBASEDEF 19.0 (H) 11/11/2022 0114   O2SAT 99 11/11/2022 0114     Coagulation Profile: Recent Labs  Lab 11/11/22 0859  INR 5.2*    Cardiac Enzymes: No results for input(s): "CKTOTAL", "CKMB", "CKMBINDEX", "TROPONINI" in the last 168 hours.  HbA1C: Hgb A1c MFr Bld  Date/Time Value Ref Range Status  11/07/2022 01:00 AM 5.3 4.8 - 5.6 % Final    Comment:    (NOTE)         Prediabetes: 5.7 - 6.4         Diabetes: >6.4         Glycemic control for adults with diabetes: <7.0   01/13/2021 06:30 AM 5.5 4.8 - 5.6 % Final    Comment:    (NOTE) Pre diabetes:          5.7%-6.4%  Diabetes:              >6.4%  Glycemic control for   <7.0% adults with diabetes     CBG: Recent Labs  Lab 11/07/22 1137 11/15/2022 2335 11/11/22 0156 11/11/22 0344 11/11/22 0748  GLUCAP 114* 157* 76 203* 243*    Review of Systems:   Critically ill, unable to obtain complete ROS.   CRITICAL CARE Performed by: Omar Person   Total critical care time: 55 minutes  Critical care time was exclusive of separately billable procedures and treating other patients.  Critical care was necessary to treat or prevent imminent or life-threatening deterioration.  Critical care was time spent personally by me on the following activities: development of treatment plan with patient and/or surrogate as well as nursing, discussions with consultants, evaluation of patient's response to treatment, examination of patient, obtaining history from patient or surrogate, ordering and  performing treatments and interventions, ordering and review of laboratory studies, ordering and review of radiographic studies, pulse oximetry and re-evaluation of patient's condition.

## 2022-11-11 NOTE — H&P (Signed)
NAME:  Erika Taylor, MRN:  [id number], DOB:  1986-08-15, LOS: 0 ADMISSION DATE:  11-17-2022, CONSULTATION DATE:  11/11/2022 REFERRING MD: Alvy Beal, PA, CHIEF COMPLAINT: Cardiac arrest  History of Present Illness:  This is a 36 year old female, past medical history of depression, arrived via EMS, known history of polysubstance use.  Patient was last seen by family at least around 45 minutes prior to being found unresponsive pulseless and apneic.  She was given Narcan by the fire department and CPR was initiated at 2145 by EMS.  She obtained ROSC but lost pulse x 2.  She was brought to to the emergency department she had a left tibial IO placed with epinephrine infusing.  Pertinent  Medical History   Past Medical History:  Diagnosis Date   Depression      Significant Hospital Events: Including procedures, antibiotic start and stop dates in addition to other pertinent events     Interim History / Subjective:  Critically ill, per HPI above  Objective   There were no vitals taken for this visit.    Vent Mode: PRVC FiO2 (%):  [80 %-100 %] 80 % Set Rate:  [26 bmp-28 bmp] 28 bmp Vt Set:  [500 mL-530 mL] 530 mL PEEP:  [5 cmH20] 5 cmH20 Plateau Pressure:  [22 cmH20] 22 cmH20  No intake or output data in the 24 hours ending 11/11/22 0002 There were no vitals filed for this visit.  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 Presumed drug overdose History of polysubstance abuse Profound vasoplegic shock Hyperkalemia, related to acidosis, mixed metabolic and respiratory acidosis Hypoglycemia  Plan: 2 amp of bicarb Start bicarbonate infusion, in D5 which will also help with hypoglycemia, she did respond to 1 amp of D50, Q2-hour CBG. Other temporizing measures, Lokelma for hyperkalemia Continue epinephrine infusion to maintain mean arterial pressure greater than 65 Warming blanket/Bair hugger started Consult to neurology Stat EEG Head CT pending Increased minute ventilation on support to decrease pCO2. Repeat ABG in an hour after vent changes. ICU admission, low tidal volume ventilation, VAP prophylaxis Holding all sedation, she has not received any sedatives, also received no medications for induction for intubation or paralytics.   Best Practice (right click and "Reselect all SmartList Selections" daily)   Diet/type: NPO DVT prophylaxis: prophylactic heparin  GI prophylaxis: PPI Lines: N/A Foley:  N/A Code Status:  full code Last date of multidisciplinary goals of care discussion [we will continue to attempt to contact family.  Currently no family at bedside or in ER.]  Labs   CBC: Recent Labs  Lab 11/06/22 2138 11/06/22 2201 11/07/22 0017 11/07/22 0406 17-Nov-2022 2256 November 17, 2022 2307 11-17-2022 2345  WBC 29.5*  --  19.8* 15.6*  --  9.0  --   NEUTROABS 27.3*  --   --   --   --  5.3  --  HGB 13.3   < > 12.4 11.6* 13.3 12.7 10.9*  HCT 40.2   < > 37.3 36.6 39.0 43.3 32.0*  MCV 100.5*  --  100.3* 101.4*  --  113.9*  --   PLT 350  --  310 279  --  244  --    < > = values in this interval not displayed.    Basic Metabolic Panel: Recent Labs  Lab 11/06/22 2138 11/06/22 2201 11/06/22 2328 11/07/22 0017 11/07/22 0406  December 01, 2022 2256 12/01/22 2345  NA 136 137  --   --  138 137 136  K 5.5* 7.2*  --   --  4.0 6.5* 6.0*  CL 108  --   --   --  105 108  --   CO2 18*  --   --   --  25  --   --   GLUCOSE 313*  --   --   --  97 <20*  --   BUN 10  --   --   --  9 30*  --   CREATININE 1.09*  --   --  0.94 0.93 2.90*  --   CALCIUM 8.6*  --   --   --  9.0  --   --   MG  --   --  2.1  --   --   --   --    GFR: Estimated Creatinine Clearance: 31.6 mL/min (A) (by C-G formula based on SCr of 2.9 mg/dL (H)). Recent Labs  Lab 11/06/22 2138 11/06/22 2328 11/07/22 0017 11/07/22 0019 11/07/22 0406 11/07/22 0740 12-01-22 2307  PROCALCITON  --  1.64  --   --   --   --   --   WBC 29.5*  --  19.8*  --  15.6*  --  9.0  LATICACIDVEN  --   --   --  1.5 2.4* 1.6  --     Liver Function Tests: Recent Labs  Lab 11/06/22 2138 11/07/22 0406  AST 31 24  ALT 18 16  ALKPHOS 49 38  BILITOT 0.2* 0.4  PROT 6.4* 6.3*  ALBUMIN 3.7 3.5   No results for input(s): "LIPASE", "AMYLASE" in the last 168 hours. Recent Labs  Lab 11/07/22 0100  AMMONIA 23    ABG    Component Value Date/Time   PHART 6.863 (LL) 12-01-22 2345   PCO2ART 53.7 (H) Dec 01, 2022 2345   PO2ART 181 (H) 12/01/22 2345   HCO3 9.8 (L) 01-Dec-2022 2345   TCO2 11 (L) 12/01/2022 2345   ACIDBASEDEF 23.0 (H) 12/01/22 2345   O2SAT 98 01-Dec-2022 2345     Coagulation Profile: No results for input(s): "INR", "PROTIME" in the last 168 hours.  Cardiac Enzymes: No results for input(s): "CKTOTAL", "CKMB", "CKMBINDEX", "TROPONINI" in the last 168 hours.  HbA1C: Hgb A1c MFr Bld  Date/Time Value Ref Range Status  11/07/2022 01:00 AM 5.3 4.8 - 5.6 % Final    Comment:    (NOTE)         Prediabetes: 5.7 - 6.4         Diabetes: >6.4         Glycemic control for adults with diabetes: <7.0   01/13/2021 06:30 AM 5.5 4.8 - 5.6 % Final    Comment:    (NOTE) Pre diabetes:          5.7%-6.4%  Diabetes:              >6.4%  Glycemic control for    <  7.0% adults with diabetes     CBG: Recent Labs  Lab 11/07/22 0134 11/07/22 0347 11/07/22 0753 11/07/22 1137 Nov 23, 2022 2335  GLUCAP 105* 70 121* 114* 157*    Review of Systems:   Critically ill, per HPI above unable to obtain complete ROS.  Past Medical History:  She,  has a past medical history of Depression.   Surgical History:  No past surgical history on file.   Social History:   reports that she has been smoking cigarettes. She started smoking about 7 years ago. She has a 6.00 pack-year smoking history. She has never used smokeless tobacco. She reports current alcohol use. She reports that she does not currently use drugs after having used the following drugs: Cocaine, Benzodiazepines, Barbituates, and Marijuana.   Family History:  Her family history includes Healthy in her father and mother. She was adopted.   Allergies Allergies  Allergen Reactions   Penicillins Rash     Home Medications  Prior to Admission medications   Medication Sig Start Date End Date Taking? Authorizing Provider  ALPRAZolam Prudy Feeler) 0.5 MG tablet Take 0.5 mg by mouth 3 (three) times daily. 10/31/22   [provider]  escitalopram (LEXAPRO) 10 MG tablet Take 10 mg by mouth daily. 10/05/22   [provider]  ibuprofen (ADVIL) 800 MG tablet Take 1 tablet (800 mg total) by mouth every 8 (eight) hours as needed for moderate pain (ankle pain). 11/07/22   Desai, Rahul P, PA-C  naloxone [facility]) nasal spray 4 mg/0.1 mL Place 1 spray into the nose once as needed for up to 1 dose. 01/29/21   Tegeler, Canary Brim, MD  nystatin ointment (MYCOSTATIN) Apply 1 Application topically 3 (three) times daily. Lips 10/17/22   [provider]  amitriptyline (ELAVIL) 25 MG tablet 1 po tid PRN anxiety or pain 09/21/18 11/16/20  Opalski, Gavin Pound, DO  pregabalin (LYRICA) 75 MG capsule Take 1 capsule (75 mg total) by mouth 2 (two) times daily. 09/21/18 11/16/20  Thomasene Lot, DO     This  patient is critically ill with multiple organ system failure; which, requires frequent high complexity decision making, assessment, support, evaluation, and titration of therapies. This was completed through the application of advanced monitoring technologies and extensive interpretation of multiple databases. During this encounter critical care time was devoted to patient care services described in this note for 36 minutes.  Josephine Igo, DO [facility] 11/11/2022 12:14 AM

## 2022-11-11 NOTE — Procedures (Signed)
Central Venous Catheter Insertion Procedure Note  Orvella Digiulio  [id number]  1986-01-05  Date:11/11/22  Time:10:48 AM   Provider Performing:Erian Lariviere Judie Petit Janyth Contes   Procedure: Insertion of Non-tunneled Central Venous Catheter(36556)with US guidance (02409)    Indication(s) Hemodialysis  Consent Unable to obtain consent due to emergent nature of procedure.  Anesthesia Topical only with 1% lidocaine   Timeout Verified patient identification, verified procedure, site/side was marked, verified correct patient position, special equipment/implants available, medications/allergies/relevant history reviewed, required imaging and test results available.  Sterile Technique Maximal sterile technique including full sterile barrier drape, hand hygiene, sterile gown, sterile gloves, mask, hair covering, sterile ultrasound probe cover (if used).  Procedure Description Area of catheter insertion was cleaned with chlorhexidine and draped in sterile fashion.   With real-time ultrasound guidance a HD catheter was placed into the right internal jugular vein.  Nonpulsatile blood flow and easy flushing noted in all ports.  The catheter was sutured in place and sterile dressing applied.  Complications/Tolerance None; patient tolerated the procedure well. Chest X-ray is ordered to verify placement for internal jugular or subclavian cannulation.  Chest x-ray is not ordered for femoral cannulation.  EBL Minimal  Specimen(s) None

## 2022-11-11 NOTE — Progress Notes (Signed)
RT attempted a line with the assistance of another RT. A line unsuccessful. RN made aware.

## 2022-11-11 NOTE — Procedures (Signed)
[facility]  Stat EEG Report  Video Performed: Performed  Demographics: Patient Name:   Erika, Taylor  Date of Birth:   [birth year]  Identification Number:   MRN - [id number]  Study Times:  Study Start Time:   11/11/2022 02:31:00 Study End Time:   11/11/2022 02:58:00  Duration:   27 minutes  Indication(s): Encephalopathy  Technical Summary:  This EEG was performed utilizing standard International 10-20 System of electrode placement. One channel electrocardiogram was monitored. Data were obtained and interpreted utilizing referential montage recording, with reformatting to longitudinal, transverse bipolar, and referential montages as necessary for interpretation.  State(s):      Coma (not medically induced)   Activation Procedures: Hyperventilation: Not performed Photic Stimulation: Not performed   EEG Description:  There is no normal pattern of wake and sleep cycle.  The background is unreactive and consist of burst-suppression pattern. There is 1-2 seconds of delta slowing during burst intermix with 2-4 seconds of suppression. Study limited by electrode artifacts seen at right posterior leads, P8, P4, and O2.    Event: none reported  Impression:  This is an abnormal EEG study   Clinical Correlation:  This is consistent with severe diffuse nonspecific cerebral dysfunction. Burst suppression pattern can be seen with hypothermia, hypoxic-ischemic encephalopathy, anesthesia induced coma, etc. Clinical correlation is advised.   Dr Larkin Ina      [facility] For Inpatient follow-up with [facility] physician please call [facility] [phone]. This is not an outpatient service. Post hospital discharge, please contact hospital directly.

## 2022-11-11 NOTE — Procedures (Signed)
Arterial Catheter Insertion Procedure Note  Jyl Chico  [id number]  05-14-1986  Date:11/11/22  Time:10:47 AM    Provider Performing: Tobey Grim    Procedure: Insertion of Arterial Line (91478) with US guidance (29562)   Indication(s) Blood pressure monitoring and/or need for frequent ABGs  Consent Unable to obtain consent due to emergent nature of procedure.  Anesthesia None   Time Out Verified patient identification, verified procedure, site/side was marked, verified correct patient position, special equipment/implants available, medications/allergies/relevant history reviewed, required imaging and test results available.   Sterile Technique Maximal sterile technique including full sterile barrier drape, hand hygiene, sterile gown, sterile gloves, mask, hair covering, sterile ultrasound probe cover (if used).   Procedure Description Area of catheter insertion was cleaned with chlorhexidine and draped in sterile fashion. With real-time ultrasound guidance an arterial catheter was placed into the right femoral artery.  Appropriate arterial tracings confirmed on monitor.     Complications/Tolerance None; patient tolerated the procedure well.   EBL Minimal   Specimen(s) None

## 2022-11-11 NOTE — Progress Notes (Signed)
I was called about this patient-  [name], [name]

## 2022-11-11 NOTE — H&P (Incomplete)
NAME:  Erika Taylor, MRN:  [id number], DOB:  17-Apr-1986, LOS: 0 ADMISSION DATE:  2022-11-25, CONSULTATION DATE:  11/11/2022 REFERRING MD: Alvy Beal, PA, CHIEF COMPLAINT: Cardiac arrest  History of Present Illness:  This is a 36 year old female, past medical history of depression, arrived via EMS, known history of polysubstance use.  Patient was last seen by family at least around 45 minutes prior to being found unresponsive pulseless and apneic.  She was given Narcan by the fire department and CPR was initiated at 2145 by EMS.  She obtained ROSC but lost pulse x 2.  She was brought to to the emergency department she had a left tibial IO placed with epinephrine infusing.  Pertinent  Medical History   Past Medical History:  Diagnosis Date  . Depression      Significant Hospital Events: Including procedures, antibiotic start and stop dates in addition to other pertinent events     Interim History / Subjective:  ***  Objective   There were no vitals taken for this visit.    Vent Mode: PRVC FiO2 (%):  [80 %-100 %] 80 % Set Rate:  [26 bmp-28 bmp] 28 bmp Vt Set:  [500 mL-530 mL] 530 mL PEEP:  [5 cmH20] 5 cmH20 Plateau Pressure:  [22 cmH20] 22 cmH20  No intake or output data in the 24 hours ending 11/11/22 0002 There were no vitals filed for this visit.  Examination: General: *** HENT: *** Lungs: *** Cardiovascular: *** Abdomen: *** Extremities: *** Neuro: *** GU: ***  Resolved Hospital Problem list   ***  Assessment & Plan:  ***  Best Practice (right click and "Reselect all SmartList Selections" daily)   Diet/type: {diet type:25684} DVT prophylaxis: {anticoagulation (Optional):25687} GI prophylaxis: {XT:02409} Lines: {Central Venous Access:25771} Foley:  {Central Venous Access:25691} Code Status:  {Code Status:26939} Last date of multidisciplinary goals of care discussion [***]  Labs   CBC: Recent Labs  Lab 11/06/22 2138 11/06/22 2201 11/07/22 0017  11/07/22 0406 11/25/2022 2256 11/25/22 2307 11-25-2022 2345  WBC 29.5*  --  19.8* 15.6*  --  9.0  --   NEUTROABS 27.3*  --   --   --   --  5.3  --   HGB 13.3   < > 12.4 11.6* 13.3 12.7 10.9*  HCT 40.2   < > 37.3 36.6 39.0 43.3 32.0*  MCV 100.5*  --  100.3* 101.4*  --  113.9*  --   PLT 350  --  310 279  --  244  --    < > = values in this interval not displayed.    Basic Metabolic Panel: Recent Labs  Lab 11/06/22 2138 11/06/22 2201 11/06/22 2328 11/07/22 0017 11/07/22 0406 November 25, 2022 2256 Nov 25, 2022 2345  NA 136 137  --   --  138 137 136  K 5.5* 7.2*  --   --  4.0 6.5* 6.0*  CL 108  --   --   --  105 108  --   CO2 18*  --   --   --  25  --   --   GLUCOSE 313*  --   --   --  97 <20*  --   BUN 10  --   --   --  9 30*  --   CREATININE 1.09*  --   --  0.94 0.93 2.90*  --   CALCIUM 8.6*  --   --   --  9.0  --   --   MG  --   --  2.1  --   --   --   --    GFR: Estimated Creatinine Clearance: 31.6 mL/min (A) (by C-G formula based on SCr of 2.9 mg/dL (H)). Recent Labs  Lab 11/06/22 2138 11/06/22 2328 11/07/22 0017 11/07/22 0019 11/07/22 0406 11/07/22 0740 29-Nov-2022 2307  PROCALCITON  --  1.64  --   --   --   --   --   WBC 29.5*  --  19.8*  --  15.6*  --  9.0  LATICACIDVEN  --   --   --  1.5 2.4* 1.6  --     Liver Function Tests: Recent Labs  Lab 11/06/22 2138 11/07/22 0406  AST 31 24  ALT 18 16  ALKPHOS 49 38  BILITOT 0.2* 0.4  PROT 6.4* 6.3*  ALBUMIN 3.7 3.5   No results for input(s): "LIPASE", "AMYLASE" in the last 168 hours. Recent Labs  Lab 11/07/22 0100  AMMONIA 23    ABG    Component Value Date/Time   PHART 6.863 (LL) 29-Nov-2022 2345   PCO2ART 53.7 (H) Nov 29, 2022 2345   PO2ART 181 (H) 2022/11/29 2345   HCO3 9.8 (L) 11-29-2022 2345   TCO2 11 (L) Nov 29, 2022 2345   ACIDBASEDEF 23.0 (H) 11/29/22 2345   O2SAT 98 2022/11/29 2345     Coagulation Profile: No results for input(s): "INR", "PROTIME" in the last 168 hours.  Cardiac Enzymes: No results  for input(s): "CKTOTAL", "CKMB", "CKMBINDEX", "TROPONINI" in the last 168 hours.  HbA1C: Hgb A1c MFr Bld  Date/Time Value Ref Range Status  11/07/2022 01:00 AM 5.3 4.8 - 5.6 % Final    Comment:    (NOTE)         Prediabetes: 5.7 - 6.4         Diabetes: >6.4         Glycemic control for adults with diabetes: <7.0   01/13/2021 06:30 AM 5.5 4.8 - 5.6 % Final    Comment:    (NOTE) Pre diabetes:          5.7%-6.4%  Diabetes:              >6.4%  Glycemic control for   <7.0% adults with diabetes     CBG: Recent Labs  Lab 11/07/22 0134 11/07/22 0347 11/07/22 0753 11/07/22 1137 11/29/22 2335  GLUCAP 105* 70 121* 114* 157*    Review of Systems:   ***  Past Medical History:  She,  has a past medical history of Depression.   Surgical History:  No past surgical history on file.   Social History:   reports that she has been smoking cigarettes. She started smoking about 7 years ago. She has a 6.00 pack-year smoking history. She has never used smokeless tobacco. She reports current alcohol use. She reports that she does not currently use drugs after having used the following drugs: Cocaine, Benzodiazepines, Barbituates, and Marijuana.   Family History:  Her family history includes Healthy in her father and mother. She was adopted.   Allergies Allergies  Allergen Reactions  . Penicillins Rash     Home Medications  Prior to Admission medications   Medication Sig Start Date End Date Taking? Authorizing Provider  ALPRAZolam Prudy Feeler) 0.5 MG tablet Take 0.5 mg by mouth 3 (three) times daily. 10/31/22   [provider]  escitalopram (LEXAPRO) 10 MG tablet Take 10 mg by mouth daily. 10/05/22   [provider]  ibuprofen (ADVIL) 800 MG tablet Take 1 tablet (800 mg total) by  mouth every 8 (eight) hours as needed for moderate pain (ankle pain). 11/07/22   Desai, Rahul P, PA-C  naloxone [facility]) nasal spray 4 mg/0.1 mL Place 1 spray into the nose once as needed for up  to 1 dose. 01/29/21   Tegeler, Canary Brim, MD  nystatin ointment (MYCOSTATIN) Apply 1 Application topically 3 (three) times daily. Lips 10/17/22   [provider]  amitriptyline (ELAVIL) 25 MG tablet 1 po tid PRN anxiety or pain 09/21/18 11/16/20  Opalski, Gavin Pound, DO  pregabalin (LYRICA) 75 MG capsule Take 1 capsule (75 mg total) by mouth 2 (two) times daily. 09/21/18 11/16/20  Thomasene Lot, DO     Critical care time: ***

## 2022-11-11 NOTE — Progress Notes (Incomplete)
Noemi Chapel called and she stated that the patient lived with her and her family for about 15 years in and out between jail visits ect. Florentina Addison states that  her mother has dementia and patient son is 51. Florentina Addison states the patient is the only child. There is no father. [phone]

## 2022-11-11 NOTE — Progress Notes (Addendum)
Neurology Progress Note  Brief HPI: Patient with history of polysubstance abuse and depression initially presented unresponsive and pulseless with concern for overdose.  She was found pulseless and apneic by family at home, received Narcan and CPR with EMS.  ROSC was achieved, but pulse was lost 3 times and then regained.  On exam, patient is unresponsive despite no sedation.  Urine toxicology was positive for cocaine, opiates and benzos.  Subjective: Patient remains in the ICU with high doses of pressors.  She does trigger the ventilator at times and has a cough but no other reflexes.  Exam: Vitals:   11/11/22 0750 11/11/22 0836  BP:  (!) 86/42  Pulse:  76  Resp:  (!) 36  Temp: 98.8 F (37.1 C)   SpO2:  100%   Gen: In bed, NAD Resp: Respirations synchronous with ventilator  Neuro (on no sedation): Pupils 4 mm, and midposition and fixed, no oculocephalic reflex, no corneal reflex cough reflex present, and patient does trigger breaths on the ventilator occasionally.  She does not respond to sternal rub or noxious stimulation in all 4 extremities.   Pertinent Labs:    Latest Ref Rng & Units 11/11/2022    8:59 AM 11/11/2022    3:11 AM 11/11/2022    2:55 AM  CBC  WBC 4.0 - 10.5 K/uL 16.9   17.9   Hemoglobin 12.0 - 15.0 g/dL 90.3  00.9  23.3   Hematocrit 36.0 - 46.0 % 34.3  37.0  43.0   Platelets 150 - 400 K/uL 150 - 400 K/uL 190    187   174        Latest Ref Rng & Units 11/11/2022    3:11 AM 11/11/2022    1:23 AM 11/11/2022    1:14 AM  BMP  Glucose 70 - 99 mg/dL 007  79    BUN 6 - 20 mg/dL 28  19    Creatinine 6.22 - 1.00 mg/dL 6.33  3.54    Sodium 562 - 145 mmol/L 139  144  141   Potassium 3.5 - 5.1 mmol/L 5.6  5.5  5.4   Chloride 98 - 111 mmol/L 107  106    CO2 22 - 32 mmol/L  10    Calcium 8.9 - 10.3 mg/dL  6.5     ABG    Component Value Date/Time   PHART 7.095 (LL) 11/11/2022 0114   PCO2ART 30.4 (L) 11/11/2022 0114   PO2ART 199 (H) 11/11/2022 0114   HCO3 9.8 (L)  11/11/2022 0114   TCO2 12 (L) 11/11/2022 0311   ACIDBASEDEF 19.0 (H) 11/11/2022 0114   O2SAT 99 11/11/2022 0114     Imaging Reviewed: CT head: Reading states no acute abnormality, however ventricles look small and crowded.  Assessment: 36 year old patient with history of polysubstance abuse and depression was admitted after being found pulseless and apneic at home.  She was given Narcan by EMS, and CPR was performed with ROSC achieved however pulse was lost subsequently 3 times.  Pupils are midline fixed and nonreactive, and patient has no response to noxious stimuli.  Neurology consulted for prognostication.  High suspicion for significant anoxic brain injury.  Patient is currently in the ICU and is rather unstable on high doses of pressors and will be starting CRRT today.  Read on EEG reveals complete background suppression and absent reactivity but no seizure activity.  Would like to repeat CT scan of head given midline, fixed pupils and crowded appearance of prior head CT, however  patient is too unstable for this right now.  Will need to correct metabolic abnormalities before being able to give prognosis based on neuro exam.  Patient will need MRI between day 3 and 5.  Impression: High suspicion for significant anoxic brain injury and postcardiac arrest patient  Recommendations: 1) repeat CT head when patient is stable 2) continue LTM EEG 3) MRI brain between day 3 and 5 4) avoid hypotension, hyperthermia and hyponatremia 5) neurology will continue to follow  Cortney E Ernestina Columbia , MSN, AGACNP-BC [facility] See Amion for schedule and pager information 11/11/2022 9:30 AM   Attending Neurohospitalist Addendum Patient seen and examined with APP/Resident. Agree with the history and physical as documented above. Agree with the plan as documented, which I helped formulate. I have edited the note above to reflect my full findings and recommendations. I have independently  reviewed the chart, obtained history, review of systems and examined the patient.I have personally reviewed pertinent head/neck/spine imaging (CT/MRI). Please feel free to call with any questions.  D/w CCM, will d/c EEG to make more room around the bed (patient is on 3 pressors, CRRT as well). Off sedation patient has no brainstem reflexes other than weak cough and EEG shows only diffuse suppression, both c/w poor prognosis. Will plan for MRI brain at 72 hrs if GOC decision has not been made by that time. Neurology will continue to follow for prognostication.   This patient is critically ill and at significant risk of neurological worsening, death and care requires constant monitoring of vital signs, hemodynamics,respiratory and cardiac monitoring, neurological assessment, discussion with family, other specialists and medical decision making of high complexity. I spent 45 minutes of neurocritical care time  in the care of  this patient. This was time spent independent of any time provided by nurse practitioner or PA.  Bing Neighbors, MD [facility] [phone]  If 7pm- 7am, please page neurology on call as listed in AMION.

## 2022-11-11 NOTE — Progress Notes (Signed)
Critical Care Attending  I was asked by Dr Marchelle Gearing for concurrence with a medical DNR.  This unfortunate woman suffered an unwitnessed cardiac arrest at home.  I have personally reviewed the chart and examined the patient.  Neurologically there is no response to noxious or verbal stimuli and the only reflex present is respiratory drive.   She has developed multi-system organ failure and is on CRRT. She is on near maximal doses of NE and epi.   Survival at this point is doubtful and impossible if she were to suffer a further cardiac arrest.  CPR is therefore futile and should not be offered.   I agree with a DNR order in case for further cardiac arrest.   Lynnell Catalan, MD [facility] ICU Physician [facility]  Pager: [phone] Or Epic Secure Chat After hours: [phone].  11/11/2022, 6:49 PM

## 2022-11-11 NOTE — Consult Note (Signed)
[name] Renal Consultation Note  Requesting MD: Ramaswamy Indication for Consultation: s/p cardiac arrest-  ATN with multiple metabolic derangements  HPI:  Erika Taylor is a 36 y.o. female with history of depression and poly substance abuse-  just discharged from the hospital on 12/4 after overdose.  Brought back to the ER last night in cardiac arrest-  had last been seen by family 45 minutes prior to being found unresponsive-  had ROSC but then arrested in the ER-  neuro has seen-  pupils fixed, no corneal reflex, cough reflex but is overbreathing the vent  Creatinine, Ser  Date/Time Value Ref Range Status  11/11/2022 11:35 AM 4.51 (H) 0.44 - 1.00 mg/dL Final  11/11/2022 08:59 AM 4.36 (H) 0.44 - 1.00 mg/dL Final  11/11/2022 03:11 AM 3.10 (H) 0.44 - 1.00 mg/dL Final  11/11/2022 01:23 AM 3.08 (H) 0.44 - 1.00 mg/dL Final  11/16/2022 11:07 PM 3.21 (H) 0.44 - 1.00 mg/dL Final  11/22/2022 10:56 PM 2.90 (H) 0.44 - 1.00 mg/dL Final  11/07/2022 04:06 AM 0.93 0.44 - 1.00 mg/dL Final  11/07/2022 12:17 AM 0.94 0.44 - 1.00 mg/dL Final  11/06/2022 09:38 PM 1.09 (H) 0.44 - 1.00 mg/dL Final  12/30/2021 02:20 PM 0.75 0.44 - 1.00 mg/dL Final  06/02/2021 10:30 AM 0.80 0.44 - 1.00 mg/dL Final  01/15/2021 06:39 AM 0.84 0.44 - 1.00 mg/dL Final  01/11/2021 06:07 AM 1.02 (H) 0.44 - 1.00 mg/dL Final  01/11/2021 02:22 AM 1.23 (H) 0.44 - 1.00 mg/dL Final  09/14/2018 02:55 AM 0.72 0.44 - 1.00 mg/dL Final  12/09/2017 12:04 AM 1.00 0.44 - 1.00 mg/dL Final  12/08/2017 11:50 PM 0.81 0.44 - 1.00 mg/dL Final  10/06/2015 01:15 AM 1.05 (H) 0.44 - 1.00 mg/dL Final  10/05/2015 05:16 AM 0.96 0.44 - 1.00 mg/dL Final  10/04/2015 06:05 AM 1.05 (H) 0.44 - 1.00 mg/dL Final  10/03/2015 01:45 PM 1.69 (H) 0.44 - 1.00 mg/dL Final  03/08/2012 05:42 PM 0.87 0.50 - 1.10 mg/dL Final  12/28/2011 02:45 PM 0.75 0.50 - 1.10 mg/dL Final     PMHx:   Past Medical History:  Diagnosis Date   Depression     No  past surgical history on file.  Family Hx:  Family History  Adopted: Yes  Problem Relation Age of Onset   Healthy Mother    Healthy Father     Social History:  reports that she has been smoking cigarettes. She started smoking about 7 years ago. She has a 6.00 pack-year smoking history. She has never used smokeless tobacco. She reports current alcohol use. She reports that she does not currently use drugs after having used the following drugs: Cocaine, Benzodiazepines, Barbituates, and Marijuana.  Allergies:  Allergies  Allergen Reactions   Penicillins Rash    Medications: Prior to Admission medications   Medication Sig Start Date End Date Taking? Authorizing Provider  ALPRAZolam Duanne Moron) 0.5 MG tablet Take 0.5 mg by mouth 3 (three) times daily. 10/31/22  Yes [provider]  escitalopram (LEXAPRO) 10 MG tablet Take 10 mg by mouth daily. 10/05/22  Yes [provider]  ibuprofen (ADVIL) 800 MG tablet Take 1 tablet (800 mg total) by mouth every 8 (eight) hours as needed for moderate pain (ankle pain). 11/07/22  Yes Desai, Rahul P, PA-C  naloxone (NARCAN) nasal spray 4 mg/0.1 mL Place 1 spray into the nose once as needed for up to 1 dose. 01/29/21  Yes Tegeler, Gwenyth Allegra, MD  nystatin ointment (MYCOSTATIN) Apply 1 Application topically  3 (three) times daily. Lips 10/17/22  Yes [provider]  amitriptyline (ELAVIL) 25 MG tablet 1 po tid PRN anxiety or pain 09/21/18 11/16/20  Opalski, Neoma Laming, DO  pregabalin (LYRICA) 75 MG capsule Take 1 capsule (75 mg total) by mouth 2 (two) times daily. 09/21/18 11/16/20  Mellody Dance, DO    I have reviewed the patient's current medications.  Labs:  Results for orders placed or performed during the hospital encounter of 11/15/2022 (from the past 48 hour(s))  I-stat chem 8, ed     Status: Abnormal   Collection Time: 12/03/2022 10:56 PM  Result Value Ref Range   Sodium 137 135 - 145 mmol/L   Potassium 6.5 (HH) 3.5 - 5.1  mmol/L   Chloride 108 98 - 111 mmol/L   BUN 30 (H) 6 - 20 mg/dL   Creatinine, Ser 2.90 (H) 0.44 - 1.00 mg/dL   Glucose, Bld <20 (LL) 70 - 99 mg/dL    Comment: Glucose reference range applies only to samples taken after fasting for at least 8 hours.   Calcium, Ion 0.87 (LL) 1.15 - 1.40 mmol/L   TCO2 18 (L) 22 - 32 mmol/L   Hemoglobin 13.3 12.0 - 15.0 g/dL   HCT 39.0 36.0 - 46.0 %   Comment NOTIFIED PHYSICIAN   Comprehensive metabolic panel     Status: Abnormal   Collection Time: 11/18/2022 11:07 PM  Result Value Ref Range   Sodium 142 135 - 145 mmol/L   Potassium 6.8 (HH) 3.5 - 5.1 mmol/L    Comment: CRITICAL RESULT CALLED TO, READ BACK BY AND VERIFIED WITH ANNA JASPER RN 11/11/22 0041 Jerilynn Mages KOROLESKI   Chloride 103 98 - 111 mmol/L   CO2 12 (L) 22 - 32 mmol/L   Glucose, Bld <20 (LL) 70 - 99 mg/dL    Comment: CRITICAL RESULT CALLED TO, READ BACK BY AND VERIFIED WITH ANNA JASPER RN 11/11/22 0041 M KOROLESKI Glucose reference range applies only to samples taken after fasting for at least 8 hours.    BUN 20 6 - 20 mg/dL   Creatinine, Ser 3.21 (H) 0.44 - 1.00 mg/dL   Calcium 7.7 (L) 8.9 - 10.3 mg/dL   Total Protein 5.7 (L) 6.5 - 8.1 g/dL   Albumin 3.1 (L) 3.5 - 5.0 g/dL   AST 4,600 (H) 15 - 41 U/L    Comment: RESULT CONFIRMED BY MANUAL DILUTION   ALT 2,320 (H) 0 - 44 U/L    Comment: RESULT CONFIRMED BY MANUAL DILUTION   Alkaline Phosphatase 56 38 - 126 U/L   Total Bilirubin 0.7 0.3 - 1.2 mg/dL   GFR, Estimated 18 (L) >60 mL/min    Comment: (NOTE) Calculated using the CKD-EPI Creatinine Equation (2021)    Anion gap 27 (H) 5 - 15    Comment: ELECTROLYTES REPEATED TO VERIFY Performed at [facility], [address], [place], [place] [address]   CBC with Differential     Status: Abnormal   Collection Time: 12/03/2022 11:07 PM  Result Value Ref Range   WBC 9.0 4.0 - 10.5 K/uL   RBC 3.80 (L) 3.87 - 5.11 MIL/uL   Hemoglobin 12.7 12.0 - 15.0 g/dL   HCT 43.3 36.0 - 46.0 %   MCV 113.9  (H) 80.0 - 100.0 fL   MCH 33.4 26.0 - 34.0 pg   MCHC 29.3 (L) 30.0 - 36.0 g/dL   RDW 11.8 11.5 - 15.5 %   Platelets 244 150 - 400 K/uL   nRBC 1.8 (  H) 0.0 - 0.2 %   Neutrophils Relative % 60 %   Neutro Abs 5.3 1.7 - 7.7 K/uL   Lymphocytes Relative 26 %   Lymphs Abs 2.4 0.7 - 4.0 K/uL   Monocytes Relative 9 %   Monocytes Absolute 0.8 0.1 - 1.0 K/uL   Eosinophils Relative 0 %   Eosinophils Absolute 0.0 0.0 - 0.5 K/uL   Basophils Relative 0 %   Basophils Absolute 0.0 0.0 - 0.1 K/uL   Immature Granulocytes 5 %   Abs Immature Granulocytes 0.45 (H) 0.00 - 0.07 K/uL    Comment: Performed at [facility] [address], [place], [place] [address]  Lactic acid, plasma     Status: Abnormal   Collection Time: 11/06/2022 11:07 PM  Result Value Ref Range   Lactic Acid, Venous >9.0 (HH) 0.5 - 1.9 mmol/L    Comment: CRITICAL RESULT CALLED TO, READ BACK BY AND VERIFIED WITH ANNA JASPER RN 11/11/22 0041 Wiliam Ke Performed at [facility], [place] [address] [place], [place] [address]   Urinalysis, Routine w reflex microscopic Urine, Clean Catch     Status: Abnormal   Collection Time: 11/11/2022 11:30 PM  Result Value Ref Range   Color, Urine YELLOW YELLOW   APPearance CLOUDY (A) CLEAR   Specific Gravity, Urine 1.017 1.005 - 1.030   pH 5.0 5.0 - 8.0   Glucose, UA >=500 (A) NEGATIVE mg/dL   Hgb urine dipstick NEGATIVE NEGATIVE   Bilirubin Urine NEGATIVE NEGATIVE   Ketones, ur NEGATIVE NEGATIVE mg/dL   Protein, ur 30 (A) NEGATIVE mg/dL   Nitrite NEGATIVE NEGATIVE   Leukocytes,Ua MODERATE (A) NEGATIVE   RBC / HPF 0-5 0 - 5 RBC/hpf   WBC, UA >50 (H) 0 - 5 WBC/hpf   Bacteria, UA RARE (A) NONE SEEN   Squamous Epithelial / LPF 6-10 0 - 5   Mucus PRESENT    Hyaline Casts, UA PRESENT     Comment: Performed at [facility], [place] [address], [place], [place] [address]  Urine rapid drug screen (hosp performed)     Status: Abnormal   Collection Time: 11/27/2022 11:31 PM  Result Value Ref  Range   Opiates POSITIVE (A) NONE DETECTED   Cocaine POSITIVE (A) NONE DETECTED   Benzodiazepines POSITIVE (A) NONE DETECTED   Amphetamines NONE DETECTED NONE DETECTED   Tetrahydrocannabinol NONE DETECTED NONE DETECTED   Barbiturates NONE DETECTED NONE DETECTED    Comment: (NOTE) DRUG SCREEN FOR MEDICAL PURPOSES ONLY.  IF CONFIRMATION IS NEEDED FOR ANY PURPOSE, NOTIFY LAB WITHIN 5 DAYS.  LOWEST DETECTABLE LIMITS FOR URINE DRUG SCREEN Drug Class                     Cutoff (ng/mL) Amphetamine and metabolites    1000 Barbiturate and metabolites    200 Benzodiazepine                 200 Opiates and metabolites        300 Cocaine and metabolites        300 THC                            50 Performed at [facility], [place] [address] [place], [place] [address]   CBG monitoring, ED     Status: Abnormal   Collection Time: 11/21/2022 11:35 PM  Result Value Ref Range   Glucose-Capillary 157 (H)  70 - 99 mg/dL    Comment: Glucose reference range applies only to samples taken after fasting for at least 8 hours.  I-Stat arterial blood gas, ED     Status: Abnormal   Collection Time: 11/21/2022 11:45 PM  Result Value Ref Range   pH, Arterial 6.863 (LL) 7.35 - 7.45   pCO2 arterial 53.7 (H) 32 - 48 mmHg   pO2, Arterial 181 (H) 83 - 108 mmHg   Bicarbonate 9.8 (L) 20.0 - 28.0 mmol/L   TCO2 11 (L) 22 - 32 mmol/L   O2 Saturation 98 %   Acid-base deficit 23.0 (H) 0.0 - 2.0 mmol/L   Sodium 136 135 - 145 mmol/L   Potassium 6.0 (H) 3.5 - 5.1 mmol/L   Calcium, Ion 0.91 (L) 1.15 - 1.40 mmol/L   HCT 32.0 (L) 36.0 - 46.0 %   Hemoglobin 10.9 (L) 12.0 - 15.0 g/dL   Patient temperature 97.2 F    Sample type ARTERIAL    Comment NOTIFIED PHYSICIAN   I-Stat arterial blood gas, ED     Status: Abnormal   Collection Time: 11/11/22  1:14 AM  Result Value Ref Range   pH, Arterial 7.095 (LL) 7.35 - 7.45   pCO2 arterial 30.4 (L) 32 - 48 mmHg   pO2, Arterial 199 (H) 83 - 108 mmHg   Bicarbonate 9.8 (L)  20.0 - 28.0 mmol/L   TCO2 11 (L) 22 - 32 mmol/L   O2 Saturation 99 %   Acid-base deficit 19.0 (H) 0.0 - 2.0 mmol/L   Sodium 141 135 - 145 mmol/L   Potassium 5.4 (H) 3.5 - 5.1 mmol/L   Calcium, Ion 0.80 (LL) 1.15 - 1.40 mmol/L   HCT 29.0 (L) 36.0 - 46.0 %   Hemoglobin 9.9 (L) 12.0 - 15.0 g/dL   Patient temperature 92.7 F    Collection site RADIAL, ALLEN'S TEST ACCEPTABLE    Drawn by RT    Sample type ARTERIAL    Comment NOTIFIED PHYSICIAN   Lactic acid, plasma     Status: Abnormal   Collection Time: 11/11/22  1:23 AM  Result Value Ref Range   Lactic Acid, Venous >9.0 (HH) 0.5 - 1.9 mmol/L    Comment: CRITICAL VALUE NOTED. VALUE IS CONSISTENT WITH PREVIOUSLY REPORTED/CALLED VALUE Performed at [facility], [place] [address], [place], [place] [address]   Acetaminophen level     Status: Abnormal   Collection Time: 11/11/22  1:23 AM  Result Value Ref Range   Acetaminophen (Tylenol), Serum <10 (L) 10 - 30 ug/mL    Comment: (NOTE) Therapeutic concentrations vary significantly. A range of 10-30 ug/mL  may be an effective concentration for many patients. However, some  are best treated at concentrations outside of this range. Acetaminophen concentrations >150 ug/mL at 4 hours after ingestion  and >50 ug/mL at 12 hours after ingestion are often associated with  toxic reactions.  Performed at [facility], [place] [address], [place], [place] [id number]   Salicylate level     Status: None   Collection Time: 11/11/22  1:23 AM  Result Value Ref Range   Salicylate Lvl 9.0 7.0 - 30.0 mg/dL    Comment: Performed at [facility] [address] [place], [place] [id number]  Basic metabolic panel     Status: Abnormal   Collection Time: 11/11/22  1:23 AM  Result Value Ref Range   Sodium 144 135 - 145 mmol/L   Potassium 5.5 (H) 3.5 - 5.1 mmol/L  Chloride 106 98 - 111 mmol/L   CO2 10 (L) 22 - 32 mmol/L   Glucose, Bld 79 70 - 99 mg/dL    Comment: Glucose reference range applies  only to samples taken after fasting for at least 8 hours.   BUN 19 6 - 20 mg/dL   Creatinine, Ser 3.08 (H) 0.44 - 1.00 mg/dL   Calcium 6.5 (L) 8.9 - 10.3 mg/dL   GFR, Estimated 19 (L) >60 mL/min    Comment: (NOTE) Calculated using the CKD-EPI Creatinine Equation (2021)    Anion gap 28 (H) 5 - 15    Comment: ELECTROLYTES REPEATED TO VERIFY Performed at [facility] [address] [place], [place] [address]   POC CBG, ED     Status: None   Collection Time: 11/11/22  1:56 AM  Result Value Ref Range   Glucose-Capillary 76 70 - 99 mg/dL    Comment: Glucose reference range applies only to samples taken after fasting for at least 8 hours.  CBC     Status: Abnormal   Collection Time: 11/11/22  2:55 AM  Result Value Ref Range   WBC 17.9 (H) 4.0 - 10.5 K/uL   RBC 3.87 3.87 - 5.11 MIL/uL   Hemoglobin 12.5 12.0 - 15.0 g/dL   HCT 43.0 36.0 - 46.0 %   MCV 111.1 (H) 80.0 - 100.0 fL   MCH 32.3 26.0 - 34.0 pg   MCHC 29.1 (L) 30.0 - 36.0 g/dL   RDW 11.7 11.5 - 15.5 %   Platelets 174 150 - 400 K/uL   nRBC 1.4 (H) 0.0 - 0.2 %    Comment: Performed at [facility] [address] [place], [place] [address]  Magnesium     Status: Abnormal   Collection Time: 11/11/22  2:55 AM  Result Value Ref Range   Magnesium 3.0 (H) 1.7 - 2.4 mg/dL    Comment: Performed at [facility] [address], [place], [place] [address]  Phosphorus     Status: Abnormal   Collection Time: 11/11/22  2:55 AM  Result Value Ref Range   Phosphorus >30.0 (H) 2.5 - 4.6 mg/dL    Comment: RESULT CONFIRMED BY MANUAL DILUTION Performed at [facility], [place] [address], [place],  [address]   I-Stat beta hCG blood, ED     Status: None   Collection Time: 11/11/22  3:09 AM  Result Value Ref Range   I-stat hCG, quantitative <5.0 <5 mIU/mL   Comment 3            Comment:   GEST. AGE      CONC.  (mIU/mL)   <=1 WEEK        5 - 50     2 WEEKS       50 - 500     3 WEEKS       100 - 10,000     4  WEEKS     1,000 - 30,000        FEMALE AND NON-PREGNANT FEMALE:     LESS THAN 5 mIU/mL   I-stat chem 8, ED     Status: Abnormal   Collection Time: 11/11/22  3:11 AM  Result Value Ref Range   Sodium 139 135 - 145 mmol/L   Potassium 5.6 (H) 3.5 - 5.1 mmol/L   Chloride 107 98 - 111 mmol/L   BUN 28 (H) 6 - 20 mg/dL   Creatinine, Ser 3.10 (H) 0.44 -  1.00 mg/dL   Glucose, Bld 156 (H) 70 - 99 mg/dL    Comment: Glucose reference range applies only to samples taken after fasting for at least 8 hours.   Calcium, Ion 0.76 (LL) 1.15 - 1.40 mmol/L   TCO2 12 (L) 22 - 32 mmol/L   Hemoglobin 12.6 12.0 - 15.0 g/dL   HCT 37.0 36.0 - 46.0 %   Comment NOTIFIED PHYSICIAN   MRSA Next Gen by PCR, Nasal     Status: None   Collection Time: 11/11/22  3:42 AM   Specimen: Nasal Mucosa; Nasal Swab  Result Value Ref Range   MRSA by PCR Next Gen NOT DETECTED NOT DETECTED    Comment: (NOTE) The GeneXpert MRSA Assay (FDA approved for NASAL specimens only), is one component of a comprehensive MRSA colonization surveillance program. It is not intended to diagnose MRSA infection nor to guide or monitor treatment for MRSA infections. Test performance is not FDA approved in patients less than 79 years old. Performed at [facility], [place] [address], [place], [place] [address]   Glucose, capillary     Status: Abnormal   Collection Time: 11/11/22  3:44 AM  Result Value Ref Range   Glucose-Capillary 203 (H) 70 - 99 mg/dL    Comment: Glucose reference range applies only to samples taken after fasting for at least 8 hours.  Glucose, capillary     Status: Abnormal   Collection Time: 11/11/22  7:48 AM  Result Value Ref Range   Glucose-Capillary 243 (H) 70 - 99 mg/dL    Comment: Glucose reference range applies only to samples taken after fasting for at least 8 hours.  I-STAT 7, (LYTES, BLD GAS, ICA, H+H)     Status: Abnormal   Collection Time: 11/11/22  8:26 AM  Result Value Ref Range   pH, Arterial 7.155 (LL)  7.35 - 7.45   pCO2 arterial 29.0 (L) 32 - 48 mmHg   pO2, Arterial 246 (H) 83 - 108 mmHg   Bicarbonate 10.2 (L) 20.0 - 28.0 mmol/L   TCO2 11 (L) 22 - 32 mmol/L   O2 Saturation 100 %   Acid-base deficit 17.0 (H) 0.0 - 2.0 mmol/L   Sodium 139 135 - 145 mmol/L   Potassium 5.1 3.5 - 5.1 mmol/L   Calcium, Ion 0.71 (LL) 1.15 - 1.40 mmol/L   HCT 34.0 (L) 36.0 - 46.0 %   Hemoglobin 11.6 (L) 12.0 - 15.0 g/dL   Patient temperature 98.8 F    Sample type ARTERIAL    Comment NOTIFIED PHYSICIAN   POCT I-Stat EG7     Status: Abnormal   Collection Time: 11/11/22  8:42 AM  Result Value Ref Range   pH, Ven 7.137 (LL) 7.25 - 7.43   pCO2, Ven 41.0 (L) 44 - 60 mmHg   pO2, Ven 50 (H) 32 - 45 mmHg   Bicarbonate 13.8 (L) 20.0 - 28.0 mmol/L   TCO2 15 (L) 22 - 32 mmol/L   O2 Saturation 73 %   Acid-base deficit 14.0 (H) 0.0 - 2.0 mmol/L   Sodium 141 135 - 145 mmol/L   Potassium 4.9 3.5 - 5.1 mmol/L   Calcium, Ion 0.68 (LL) 1.15 - 1.40 mmol/L   HCT 31.0 (L) 36.0 - 46.0 %   Hemoglobin 10.5 (L) 12.0 - 15.0 g/dL   Patient temperature 98.8 F    Sample type VENOUS   Culture, Respiratory w Gram Stain     Status: None (Preliminary result)   Collection Time:  11/11/22  8:53 AM   Specimen: Tracheal Aspirate; Respiratory  Result Value Ref Range   Specimen Description TRACHEAL ASPIRATE    Special Requests NONE    Gram Stain      FEW WBC PRESENT, PREDOMINANTLY PMN ABUNDANT GRAM POSITIVE COCCI IN CLUSTERS IN PAIRS IN CHAINS Performed at [facility], [place] [address], [place], [place] [address]    Culture PENDING    Report Status PENDING   CBC     Status: Abnormal   Collection Time: 11/11/22  8:59 AM  Result Value Ref Range   WBC 16.9 (H) 4.0 - 10.5 K/uL   RBC 3.17 (L) 3.87 - 5.11 MIL/uL   Hemoglobin 10.2 (L) 12.0 - 15.0 g/dL   HCT 34.3 (L) 36.0 - 46.0 %   MCV 108.2 (H) 80.0 - 100.0 fL   MCH 32.2 26.0 - 34.0 pg   MCHC 29.7 (L) 30.0 - 36.0 g/dL   RDW 11.8 11.5 - 15.5 %   Platelets 190 150 - 400 K/uL    nRBC 0.3 (H) 0.0 - 0.2 %    Comment: Performed at [facility], [place] [address], [place], [place] [address]  Comprehensive metabolic panel     Status: Abnormal   Collection Time: 11/11/22  8:59 AM  Result Value Ref Range   Sodium 145 135 - 145 mmol/L   Potassium 4.8 3.5 - 5.1 mmol/L   Chloride 101 98 - 111 mmol/L   CO2 12 (L) 22 - 32 mmol/L   Glucose, Bld 278 (H) 70 - 99 mg/dL    Comment: Glucose reference range applies only to samples taken after fasting for at least 8 hours.   BUN 23 (H) 6 - 20 mg/dL   Creatinine, Ser 4.36 (H) 0.44 - 1.00 mg/dL   Calcium 8.8 (L) 8.9 - 10.3 mg/dL    Comment: DELTA CHECK NOTED   Total Protein 4.0 (L) 6.5 - 8.1 g/dL   Albumin 2.2 (L) 3.5 - 5.0 g/dL   AST >10,000 (H) 15 - 41 U/L    Comment: RESULT CONFIRMED BY MANUAL DILUTION   ALT 4,666 (H) 0 - 44 U/L    Comment: RESULT CONFIRMED BY MANUAL DILUTION   Alkaline Phosphatase 69 38 - 126 U/L   Total Bilirubin 1.7 (H) 0.3 - 1.2 mg/dL   GFR, Estimated 13 (L) >60 mL/min    Comment: (NOTE) Calculated using the CKD-EPI Creatinine Equation (2021)    Anion gap 32 (H) 5 - 15    Comment: Electrolytes repeated to confirm. Performed at [facility], [place] [address] [place], [place] [address]   Lactic acid, plasma     Status: Abnormal   Collection Time: 11/11/22  8:59 AM  Result Value Ref Range   Lactic Acid, Venous >9.0 (HH) 0.5 - 1.9 mmol/L    Comment: CRITICAL VALUE NOTED. VALUE IS CONSISTENT WITH PREVIOUSLY REPORTED/CALLED VALUE Performed at [facility], [place] [address], [place], [place] [address]   Magnesium     Status: Abnormal   Collection Time: 11/11/22  8:59 AM  Result Value Ref Range   Magnesium 2.5 (H) 1.7 - 2.4 mg/dL    Comment: Performed at [facility] [address] [place], [place] [address]  Phosphorus     Status: Abnormal   Collection Time: 11/11/22  8:59 AM  Result Value Ref Range   Phosphorus >30.0 (H) 2.5 - 4.6 mg/dL    Comment: RESULT CONFIRMED BY MANUAL  DILUTION Performed at  [facility], [place] [address], [place], [place] [address]   DIC Panel ONCE - STAT     Status: Abnormal   Collection Time: 11/11/22  8:59 AM  Result Value Ref Range   Prothrombin Time 47.6 (H) 11.4 - 15.2 seconds    Comment: REPEATED TO VERIFY   INR 5.2 (HH) 0.8 - 1.2    Comment: REPEATED TO VERIFY CRITICAL RESULT CALLED TO, READ BACK BY AND VERIFIED WITH: JULIAN BEAUBRAUT RN.@1035  ON 12.8.23 BY TCALDWELL MT. (NOTE) INR goal varies based on device and disease states.    aPTT 52 (H) 24 - 36 seconds    Comment:        IF BASELINE aPTT IS ELEVATED, SUGGEST PATIENT RISK ASSESSMENT BE USED TO DETERMINE APPROPRIATE ANTICOAGULANT THERAPY.    Fibrinogen 121 (L) 210 - 475 mg/dL    Comment: (NOTE) Fibrinogen results may be underestimated in patients receiving thrombolytic therapy.    D-Dimer, Quant >20.00 (H) 0.00 - 0.50 ug/mL-FEU    Comment: (NOTE) At the manufacturer cut-off value of 0.5 g/mL FEU, this assay has a negative predictive value of 95-100%.This assay is intended for use in conjunction with a clinical pretest probability (PTP) assessment model to exclude pulmonary embolism (PE) and deep venous thrombosis (DVT) in outpatients suspected of PE or DVT. Results should be correlated with clinical presentation.    Platelets 187 150 - 400 K/uL   Smear Review NO SCHISTOCYTES SEEN     Comment: Performed at [facility], [place] [address], [place], [place] [address]  CK     Status: Abnormal   Collection Time: 11/11/22  8:59 AM  Result Value Ref Range   Total CK 7,421 (H) 38 - 234 U/L    Comment: RESULT CONFIRMED BY MANUAL DILUTION Performed at [facility], [place] [address], [place], [place] [address]   ABO/Rh     Status: None   Collection Time: 11/11/22  8:59 AM  Result Value Ref Range   ABO/RH(D)      A POS Performed at [facility] [address] [place], [place] [address]   Type and screen [place]      Status: None   Collection Time: 11/11/22  9:41 AM  Result Value Ref Range   ABO/RH(D) A POS    Antibody Screen NEG    Sample Expiration      11/28/2022,2359 Performed at [facility], [place] [address] [place], [place] [address]   Glucose, capillary     Status: Abnormal   Collection Time: 11/11/22 11:23 AM  Result Value Ref Range   Glucose-Capillary 276 (H) 70 - 99 mg/dL    Comment: Glucose reference range applies only to samples taken after fasting for at least 8 hours.  Basic metabolic panel     Status: Abnormal   Collection Time: 11/11/22 11:35 AM  Result Value Ref Range   Sodium 141 135 - 145 mmol/L   Potassium 4.4 3.5 - 5.1 mmol/L   Chloride 100 98 - 111 mmol/L   CO2 10 (L) 22 - 32 mmol/L   Glucose, Bld 336 (H) 70 - 99 mg/dL    Comment: Glucose reference range applies only to samples taken after fasting for at least 8 hours.   BUN 25 (H) 6 - 20 mg/dL   Creatinine, Ser 4.51 (H) 0.44 - 1.00 mg/dL   Calcium 6.4 (LL) 8.9 - 10.3 mg/dL    Comment: RESULTS VERIFIED BY REPEAT TESTING DELTA  CHECK NOTED CRITICAL RESULT CALLED TO, READ BACK BY AND VERIFIED WITH J.RIVERA RN @1335  12.08.2023 E.AHMED    GFR, Estimated 12 (L) >60 mL/min    Comment: (NOTE) Calculated using the CKD-EPI Creatinine Equation (2021)    Anion gap 31 (H) 5 - 15    Comment: ELECTROLYTES REPEATED TO VERIFY Performed at [facility], [address] [place], [place]   Lactic acid, plasma     Status: Abnormal   Collection Time: 11/11/22 11:35 AM  Result Value Ref Range   Lactic Acid, Venous >9.0 (HH) 0.5 - 1.9 mmol/L    Comment: CRITICAL VALUE NOTED. VALUE IS CONSISTENT WITH PREVIOUSLY REPORTED/CALLED VALUE Performed at [facility], [address] [place], [place]      ROS:  Review of systems not obtained due to patient factors.  Physical Exam: Vitals:   11/11/22 1122 11/11/22 1145  BP:  (!) 148/55  Pulse:  81  Resp:  (!) 34  Temp: 98.4 F (36.9 C)   SpO2:        General: unresponsive on vent HEENT: pupils not reactive Neck: no JVD Heart:RRR Lungs: mostly clear Abdomen: obese, soft, non tender Extremities: no significant edema  Skin: warm and dry Neuro: per neuro note-    Assessment/Plan: 36 year old WF with poly substance abuse found pulseless at home-  unclear how long down-  now with  hemodynamic instability on pressors, AKI with metabolic derangements 1.Renal- s/p cardiac arrest -  unclear how long down- now with AKI due to ATN, liver injury, lactic acidosis with hyperkalemia-  decision made to continue support and the only way to correct this acidosis and hyperkalemia is with CRRT-  appreciate CCM to place catheter and got CRRT started today-    bicarb post filter and 2 K pre and dialysate-  K already improved-  no volume removal 2. Hypertension/volume  - hypotensive and evidence of decreased perfusion for a prolonged period of time-  being supported with pressors 3. Neuro-  exam suspicious for severe anoxic brain injury-  but giving time to see if will improve- serial scans 4. Anemia  - not an issue 5. Acidosis-  elevated lactate is not coming down yet-  with CRRT bicarb post filter 6. Hyperkalemia -  due to #5-  CRRT    08-25-1984 11/11/2022, 3:03 PM

## 2022-11-11 NOTE — Procedures (Signed)
EEG Procedure CPT/Type of Study: 95718; 2-12hr EEG with video Referring Provider: Icard Primary Neurological Diagnosis: cardiac arrest  History: This is a 36 yr old patient, undergoing an EEG to evaluate for cardiac arrest. Clinical State: comatose  Technical Description:  The EEG was performed using standard setting per the guidelines of American Clinical Neurophysiology Society (ACNS).  A minimum of 21 electrodes were placed on scalp according to the International 10-20 or/and 10-10 Systems. Supplemental electrodes were placed as needed. Single EKG electrode was also used to detect cardiac arrhythmia. Patient's behavior was continuously recorded on video simultaneously with EEG. A minimum of 16 channels were used for data display. Each epoch of study was reviewed manually daily and as needed using standard referential and bipolar montages. Computerized quantitative EEG analysis (such as compressed spectral array analysis, trending, automated spike & seizure detection) were used as indicated.   Day 1: from 0439 11/11/22 to 0730 11/11/22  EEG Description: Overall Amplitude: suppressed Predominant Frequency: The background activity showed complete background suppression with some respirator artifact only. Superimposed Frequencies: none The background was symmetric  Background Abnormalities: Complete background suppression Rhythmic or periodic pattern: No Epileptiform activity: no Electrographic seizures: no Events: no   Breach rhythm: no  Reactivity: Absent  Stimulation procedures:  Hyperventilation: not done Photic stimulation: not done  Sleep Background: none  EKG:no significant arrhythmia  Impression: This was a markedly abnormal continuous video EEG due to complete background suppression and absent reactivity, indicative of a severe encephalopathy pattern. No seizures or epileptiform discharges were seen.

## 2022-11-11 NOTE — ED Notes (Signed)
Bair hugger applied.

## 2022-11-11 NOTE — Progress Notes (Signed)
EEG D/C'd. No skin breakdown. [facility] was notified.  
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
## 2022-11-11 NOTE — Progress Notes (Signed)
Pt transported from TRAA to CT02 by RN and RT w/o complications

## 2022-11-11 NOTE — Progress Notes (Signed)
LTM EEG hooked up and running - no initial skin breakdown - push button tested - [facility] monitoring.  

## 2022-11-11 NOTE — Significant Event (Signed)
Spoke to [employer] (patient boyfriends sister), States that patient was out for most of the day yesterday with a friend was of normal health and then came home and slept for most of the day. Boyfriend continued to check on patient, he states she was breathing and snoring loudly. Throughout the day continued to check on, the last time she was not breathing, he and Genny started CPR and called 911.   Boyfriend states that patient has a 36 year old son that he has a good relationship with and an adopted mother whom he states has dementia. He states that he spoke to patient mother this AM and she stated "do not called my again". Boyfriend states no father is in picture or any other family.   I have attempted multiple times to reach mother at listed number with no response.

## 2022-11-11 NOTE — Progress Notes (Signed)
Pt transported from CT02 to 3M06  by RN and RT w/o complications

## 2022-11-11 NOTE — Progress Notes (Signed)
Initial Nutrition Assessment  DOCUMENTATION CODES:   Not applicable  INTERVENTION:   When pt is hemodynamically stable, recommend initiation of enteral nutrition. Recommend: - Start Vital 1.5 @ 20 ml/hr and advance by 10 ml q 8 hours to goal rate of 60 ml/hr (1440 ml/day) - PROSource TF20 60 ml BID  Recommended tube feeding regimen at goal rate would provide 2320 kcal, 137 grams of protein, and 1100 ml of H2O.  - Recommend renal MVI daily to account for losses with CRRT  NUTRITION DIAGNOSIS:   Inadequate oral intake related to inability to eat as evidenced by NPO status.  GOAL:   Patient will meet greater than or equal to 90% of their needs  MONITOR:   Vent status, Labs, Weight trends, I & O's  REASON FOR ASSESSMENT:   Ventilator    ASSESSMENT:   36 year old female who presented to the ED on 12/07 post-cardiac arrest, presumed drug overdose. Recently admitted 12/3-12/4 with acute overdose. PMH of polysubstance abuse, depression.  12/08 - CRRT to start  Discussed pt with RN. Concern for severe anoxic brain injury. Pt starting on CRRT today. No plan for enteral nutrition today due to hemodynamic instability requiring 3 pressors. OG tube in stomach per x-ray, currently to low intermittent suction with brown output.  Unable to obtain diet and weight history at this time. Reviewed weight history in chart. Weight up compared to weight from 12/4. Pt with mild pitting generalized edema, mild pitting edema to BUE, and mild pitting edema to BLE. Weight up compared to weights from 2021 and 2022.  Patient is currently intubated on ventilator support MV: 15.9 L/min Temp (24hrs), Avg:94.9 F (34.9 C), Min:92.1 F (33.4 C), Max:99 F (37.2 C) BP (a-line): 148/56 MAP (a-line): 77  Drips: Epinephrine: 20 mcg/min Levophed: 48 mcg/min Sodium bicarb: 125 ml/hr Vasopressin: 0.04 units/min  Medications reviewed and include: SSI q 4 hours, IV protonix, IV vitamin K 10 mg x 1, IV  abx  Labs reviewed: BUN 23, creatinine 4.36, ionized calcium 0.76, phosphorus >30.0, magnesium 2.5, elevated LFTs, lactic acid >9.0, WBC 16.9, INR 5.2 CBG's: 76-276 x 24 hours  Rectal tube: 300 ml x 12 hours I/O's: +1.0 L since admit  NUTRITION - FOCUSED PHYSICAL EXAM:  Flowsheet Row Most Recent Value  Orbital Region No depletion  Upper Arm Region No depletion  Thoracic and Lumbar Region No depletion  Buccal Region Unable to assess  Temple Region No depletion  Clavicle Bone Region No depletion  Clavicle and Acromion Bone Region No depletion  Scapular Bone Region No depletion  Dorsal Hand No depletion  Patellar Region No depletion  Anterior Thigh Region No depletion  Posterior Calf Region No depletion  Edema (RD Assessment) Mild  [BUE, BLE]  Hair Reviewed  Eyes Unable to assess  Mouth Reviewed  Skin Reviewed  Nails Reviewed       Diet Order:   Diet Order             Diet NPO time specified  Diet effective now                   EDUCATION NEEDS:   Not appropriate for education at this time  Skin:  Skin Assessment: Reviewed RN Assessment  Last BM:  11/11/22 rectal tube  Height:   Ht Readings from Last 1 Encounters:  11/06/22 5\' 9"  (1.753 m)    Weight:   Wt Readings from Last 1 Encounters:  11/11/22 91.4 kg    Ideal Body Weight:  65.9 kg  BMI:  Body mass index is 29.76 kg/m.  Estimated Nutritional Needs:   Kcal:  2100-2300  Protein:  130-150 grams  Fluid:  >2.0 L    Mertie Clause, MS, RD, LDN Inpatient Clinical Dietitian Please see AMi[facility] for contact information.

## 2022-11-11 NOTE — Progress Notes (Signed)
EEG complete - results pending 

## 2022-11-11 NOTE — Progress Notes (Signed)
Pt arrives from the ER with the following personal clothing:  - 1 pink shirt - 1 blue shorts  Both items soiled and had been cut in ER. Items disposed of in trash

## 2022-11-11 NOTE — Procedures (Signed)
Central Venous Catheter Insertion Procedure Note  Erika Taylor  [id number]  August 18, 1986  Date:11/11/22  Time:10:48 AM   Provider Performing:Rabia Argote Judie Petit Janyth Contes   Procedure: Insertion of Non-tunneled Central Venous Catheter(36556) with US guidance (38333)   Indication(s) Medication administration  Consent Unable to obtain consent due to emergent nature of procedure.  Anesthesia Topical only with 1% lidocaine   Timeout Verified patient identification, verified procedure, site/side was marked, verified correct patient position, special equipment/implants available, medications/allergies/relevant history reviewed, required imaging and test results available.  Sterile Technique Maximal sterile technique including full sterile barrier drape, hand hygiene, sterile gown, sterile gloves, mask, hair covering, sterile ultrasound probe cover (if used).  Procedure Description Area of catheter insertion was cleaned with chlorhexidine and draped in sterile fashion.  With real-time ultrasound guidance a central venous catheter was placed into the right femoral vein. Nonpulsatile blood flow and easy flushing noted in all ports.  The catheter was sutured in place and sterile dressing applied.  Complications/Tolerance None; patient tolerated the procedure well. Chest X-ray is ordered to verify placement for internal jugular or subclavian cannulation.   Chest x-ray is not ordered for femoral cannulation.  EBL Minimal  Specimen(s) None

## 2022-11-12 ENCOUNTER — Inpatient Hospital Stay (HOSPITAL_COMMUNITY): Payer: Medicaid Other

## 2022-11-12 DIAGNOSIS — G40901 Epilepsy, unspecified, not intractable, with status epilepticus: Secondary | ICD-10-CM | POA: Diagnosis not present

## 2022-11-12 DIAGNOSIS — I469 Cardiac arrest, cause unspecified: Secondary | ICD-10-CM | POA: Diagnosis not present

## 2022-11-12 DIAGNOSIS — I509 Heart failure, unspecified: Secondary | ICD-10-CM | POA: Diagnosis not present

## 2022-11-12 LAB — CBC
HCT: 28.4 % — ABNORMAL LOW (ref 36.0–46.0)
Hemoglobin: 9.9 g/dL — ABNORMAL LOW (ref 12.0–15.0)
MCH: 33.3 pg (ref 26.0–34.0)
MCHC: 34.9 g/dL (ref 30.0–36.0)
MCV: 95.6 fL (ref 80.0–100.0)
Platelets: 104 10*3/uL — ABNORMAL LOW (ref 150–400)
RBC: 2.97 MIL/uL — ABNORMAL LOW (ref 3.87–5.11)
RDW: 12.1 % (ref 11.5–15.5)
WBC: 15.5 10*3/uL — ABNORMAL HIGH (ref 4.0–10.5)
nRBC: 0.2 % (ref 0.0–0.2)

## 2022-11-12 LAB — RENAL FUNCTION PANEL
Albumin: 2.8 g/dL — ABNORMAL LOW (ref 3.5–5.0)
Albumin: 2.7 g/dL — ABNORMAL LOW (ref 3.5–5.0)
Anion gap: 30 — ABNORMAL HIGH (ref 5–15)
Anion gap: 20 — ABNORMAL HIGH (ref 5–15)
BUN: 21 mg/dL — ABNORMAL HIGH (ref 6–20)
BUN: 21 mg/dL — ABNORMAL HIGH (ref 6–20)
CO2: 18 mmol/L — ABNORMAL LOW (ref 22–32)
CO2: 20 mmol/L — ABNORMAL LOW (ref 22–32)
Calcium: 6.3 mg/dL — CL (ref 8.9–10.3)
Calcium: 7.1 mg/dL — ABNORMAL LOW (ref 8.9–10.3)
Chloride: 91 mmol/L — ABNORMAL LOW (ref 98–111)
Chloride: 95 mmol/L — ABNORMAL LOW (ref 98–111)
Creatinine, Ser: 3.74 mg/dL — ABNORMAL HIGH (ref 0.44–1.00)
Creatinine, Ser: 3.15 mg/dL — ABNORMAL HIGH (ref 0.44–1.00)
GFR, Estimated: 15 mL/min — ABNORMAL LOW (ref >60)
GFR, Estimated: 19 mL/min — ABNORMAL LOW (ref >60)
Glucose, Bld: 73 mg/dL (ref 70–99)
Glucose, Bld: 99 mg/dL (ref 70–99)
Phosphorus: 7.6 mg/dL — ABNORMAL HIGH (ref 2.5–4.6)
Phosphorus: 6.8 mg/dL — ABNORMAL HIGH (ref 2.5–4.6)
Potassium: 3.6 mmol/L (ref 3.5–5.1)
Potassium: 4.8 mmol/L (ref 3.5–5.1)
Sodium: 139 mmol/L (ref 135–145)
Sodium: 135 mmol/L (ref 135–145)

## 2022-11-12 LAB — POCT I-STAT 7, (LYTES, BLD GAS, ICA,H+H)
Acid-Base Excess: 0 mmol/L (ref 0.0–2.0)
Acid-base deficit: 4 mmol/L — ABNORMAL HIGH (ref 0.0–2.0)
Bicarbonate: 26.5 mmol/L (ref 20.0–28.0)
Bicarbonate: 21.5 mmol/L (ref 20.0–28.0)
Calcium, Ion: 0.67 mmol/L — CL (ref 1.15–1.40)
Calcium, Ion: 0.88 mmol/L — CL (ref 1.15–1.40)
HCT: 28 % — ABNORMAL LOW (ref 36.0–46.0)
HCT: 26 % — ABNORMAL LOW (ref 36.0–46.0)
Hemoglobin: 9.5 g/dL — ABNORMAL LOW (ref 12.0–15.0)
Hemoglobin: 8.8 g/dL — ABNORMAL LOW (ref 12.0–15.0)
O2 Saturation: 97 %
O2 Saturation: 99 %
Patient temperature: 98.7
Patient temperature: 97.5
Potassium: 3.8 mmol/L (ref 3.5–5.1)
Potassium: 4.6 mmol/L (ref 3.5–5.1)
Sodium: 133 mmol/L — ABNORMAL LOW (ref 135–145)
Sodium: 132 mmol/L — ABNORMAL LOW (ref 135–145)
TCO2: 28 mmol/L (ref 22–32)
TCO2: 23 mmol/L (ref 22–32)
pCO2 arterial: 50.9 mmHg — ABNORMAL HIGH (ref 32–48)
pCO2 arterial: 41.5 mmHg (ref 32–48)
pH, Arterial: 7.325 — ABNORMAL LOW (ref 7.35–7.45)
pH, Arterial: 7.32 — ABNORMAL LOW (ref 7.35–7.45)
pO2, Arterial: 105 mmHg (ref 83–108)
pO2, Arterial: 127 mmHg — ABNORMAL HIGH (ref 83–108)

## 2022-11-12 LAB — PREPARE FRESH FROZEN PLASMA
Unit division: 0
Unit division: 0

## 2022-11-12 LAB — GLUCOSE, CAPILLARY
Glucose-Capillary: 74 mg/dL (ref 70–99)
Glucose-Capillary: 64 mg/dL — ABNORMAL LOW (ref 70–99)
Glucose-Capillary: 107 mg/dL — ABNORMAL HIGH (ref 70–99)
Glucose-Capillary: 89 mg/dL (ref 70–99)
Glucose-Capillary: 75 mg/dL (ref 70–99)
Glucose-Capillary: 85 mg/dL (ref 70–99)
Glucose-Capillary: 72 mg/dL (ref 70–99)
Glucose-Capillary: 107 mg/dL — ABNORMAL HIGH (ref 70–99)
Glucose-Capillary: 66 mg/dL — ABNORMAL LOW (ref 70–99)
Glucose-Capillary: 62 mg/dL — ABNORMAL LOW (ref 70–99)
Glucose-Capillary: 132 mg/dL — ABNORMAL HIGH (ref 70–99)
Glucose-Capillary: 131 mg/dL — ABNORMAL HIGH (ref 70–99)

## 2022-11-12 LAB — BASIC METABOLIC PANEL
Anion gap: 22 — ABNORMAL HIGH (ref 5–15)
Anion gap: 31 — ABNORMAL HIGH (ref 5–15)
BUN: 21 mg/dL — ABNORMAL HIGH (ref 6–20)
BUN: 23 mg/dL — ABNORMAL HIGH (ref 6–20)
CO2: 21 mmol/L — ABNORMAL LOW (ref 22–32)
CO2: 17 mmol/L — ABNORMAL LOW (ref 22–32)
Calcium: 6.9 mg/dL — ABNORMAL LOW (ref 8.9–10.3)
Calcium: 6.3 mg/dL — CL (ref 8.9–10.3)
Chloride: 92 mmol/L — ABNORMAL LOW (ref 98–111)
Chloride: 92 mmol/L — ABNORMAL LOW (ref 98–111)
Creatinine, Ser: 3.5 mg/dL — ABNORMAL HIGH (ref 0.44–1.00)
Creatinine, Ser: 3.8 mg/dL — ABNORMAL HIGH (ref 0.44–1.00)
GFR, Estimated: 17 mL/min — ABNORMAL LOW (ref >60)
GFR, Estimated: 15 mL/min — ABNORMAL LOW (ref >60)
Glucose, Bld: 74 mg/dL (ref 70–99)
Glucose, Bld: 112 mg/dL — ABNORMAL HIGH (ref 70–99)
Potassium: 4.4 mmol/L (ref 3.5–5.1)
Potassium: 3.6 mmol/L (ref 3.5–5.1)
Sodium: 135 mmol/L (ref 135–145)
Sodium: 140 mmol/L (ref 135–145)

## 2022-11-12 LAB — HEPATIC FUNCTION PANEL
ALT: 4744 U/L — ABNORMAL HIGH (ref 0–44)
AST: >10000 U/L — ABNORMAL HIGH (ref 15–41)
Albumin: 2.8 g/dL — ABNORMAL LOW (ref 3.5–5.0)
Alkaline Phosphatase: 111 U/L (ref 38–126)
Bilirubin, Direct: 2.4 mg/dL — ABNORMAL HIGH (ref 0.0–0.2)
Indirect Bilirubin: 1.7 mg/dL — ABNORMAL HIGH (ref 0.3–0.9)
Total Bilirubin: 4.1 mg/dL — ABNORMAL HIGH (ref 0.3–1.2)
Total Protein: 5 g/dL — ABNORMAL LOW (ref 6.5–8.1)

## 2022-11-12 LAB — CK
Total CK: 12501 U/L — ABNORMAL HIGH (ref 38–234)
Total CK: 11282 U/L — ABNORMAL HIGH (ref 38–234)
Total CK: 13194 U/L — ABNORMAL HIGH (ref 38–234)
Total CK: 18962 U/L — ABNORMAL HIGH (ref 38–234)

## 2022-11-12 LAB — BPAM FFP
Blood Product Expiration Date: 202312082359
Blood Product Expiration Date: 202312082359
ISSUE DATE / TIME: 202312081609
ISSUE DATE / TIME: 202312081609
Unit Type and Rh: 6200
Unit Type and Rh: 6200

## 2022-11-12 LAB — MAGNESIUM: Magnesium: 2.2 mg/dL (ref 1.7–2.4)

## 2022-11-12 LAB — CULTURE, BLOOD (ROUTINE X 2)
Culture: NO GROWTH
Special Requests: ADEQUATE

## 2022-11-12 LAB — DIC (DISSEMINATED INTRAVASCULAR COAGULATION)PANEL
D-Dimer, Quant: >20 ug/mL-FEU — ABNORMAL HIGH (ref 0.00–0.50)
Fibrinogen: 124 mg/dL — ABNORMAL LOW (ref 210–475)
INR: 4.7 (ref 0.8–1.2)
Platelets: 93 10*3/uL — ABNORMAL LOW (ref 150–400)
Prothrombin Time: 43.8 seconds — ABNORMAL HIGH (ref 11.4–15.2)
Smear Review: NONE SEEN
aPTT: 54 seconds — ABNORMAL HIGH (ref 24–36)

## 2022-11-12 LAB — TROPONIN I (HIGH SENSITIVITY)
Troponin I (High Sensitivity): 14267 ng/L (ref <18)
Troponin I (High Sensitivity): 22150 ng/L (ref <18)
Troponin I (High Sensitivity): 20042 ng/L (ref <18)
Troponin I (High Sensitivity): 20373 ng/L (ref <18)

## 2022-11-12 LAB — LACTIC ACID, PLASMA
Lactic Acid, Venous: >9 mmol/L (ref 0.5–1.9)
Lactic Acid, Venous: >9 mmol/L (ref 0.5–1.9)

## 2022-11-12 LAB — BETA-HYDROXYBUTYRIC ACID: Beta-Hydroxybutyric Acid: 0.67 mmol/L — ABNORMAL HIGH (ref 0.05–0.27)

## 2022-11-12 MED ORDER — ALBUMIN HUMAN 25 % IV SOLN
25.0000 g | Freq: Four times a day (QID) | INTRAVENOUS | Status: AC
  Administered 2022-11-12 – 2022-11-13 (×3): 25 g via INTRAVENOUS
  Filled 2022-11-12 (×3): qty 100

## 2022-11-12 MED ORDER — DEXTROSE 50 % IV SOLN
12.5000 g | INTRAVENOUS | Status: AC
  Administered 2022-11-12: 12.5 g via INTRAVENOUS
  Filled 2022-11-12: qty 50

## 2022-11-12 MED ORDER — MIDAZOLAM HCL 2 MG/2ML IJ SOLN: 2.0000 mg | INTRAMUSCULAR | Status: DC | PRN

## 2022-11-12 MED ORDER — VANCOMYCIN HCL IN DEXTROSE 1-5 GM/200ML-% IV SOLN
1000.0000 mg | INTRAVENOUS | Status: DC
  Administered 2022-11-12 – 2022-11-14 (×3): 1000 mg via INTRAVENOUS
  Filled 2022-11-12 (×3): qty 200

## 2022-11-12 MED ORDER — SODIUM CHLORIDE 0.9 % IV SOLN
4500.0000 mg | Freq: Once | INTRAVENOUS | Status: AC
  Administered 2022-11-12: 4500 mg via INTRAVENOUS
  Filled 2022-11-12: qty 45

## 2022-11-12 MED ORDER — PRISMASOL BGK 4/2.5 32-4-2.5 MEQ/L REPLACEMENT SOLN
Status: DC
  Filled 2022-11-12 (×4): qty 5000

## 2022-11-12 MED ORDER — LORAZEPAM 2 MG/ML IJ SOLN
4.0000 mg | INTRAMUSCULAR | Status: AC
  Administered 2022-11-12: 4 mg via INTRAVENOUS
  Filled 2022-11-12: qty 2

## 2022-11-12 MED ORDER — LEVETIRACETAM IN NACL 1000 MG/100ML IV SOLN
1000.0000 mg | Freq: Two times a day (BID) | INTRAVENOUS | Status: DC
  Administered 2022-11-13 – 2022-11-14 (×4): 1000 mg via INTRAVENOUS
  Filled 2022-11-12 (×4): qty 100

## 2022-11-12 MED ORDER — DEXTROSE 50 % IV SOLN
INTRAVENOUS | Status: AC
  Administered 2022-11-12: 12.5 g via INTRAVENOUS
  Filled 2022-11-12: qty 50

## 2022-11-12 MED ORDER — VITAMIN K1 10 MG/ML IJ SOLN
10.0000 mg | Freq: Every day | INTRAVENOUS | Status: AC
  Administered 2022-11-12 – 2022-11-14 (×3): 10 mg via INTRAVENOUS
  Filled 2022-11-12 (×3): qty 1

## 2022-11-12 MED ORDER — CALCIUM GLUCONATE-NACL 2-0.675 GM/100ML-% IV SOLN
2.0000 g | Freq: Once | INTRAVENOUS | Status: AC
  Administered 2022-11-12: 2000 mg via INTRAVENOUS
  Filled 2022-11-12: qty 100

## 2022-11-12 MED ORDER — DEXTROSE 50 % IV SOLN: 12.5000 g | INTRAVENOUS | Status: AC

## 2022-11-12 MED ORDER — SODIUM CHLORIDE 0.9 % IV SOLN
2.0000 g | Freq: Once | INTRAVENOUS | Status: AC
  Administered 2022-11-12: 2 g via INTRAVENOUS
  Filled 2022-11-12: qty 20

## 2022-11-12 MED ORDER — SODIUM BICARBONATE 8.4 % IV SOLN
INTRAVENOUS | Status: DC
  Filled 2022-11-12: qty 150

## 2022-11-12 MED ORDER — MIDAZOLAM HCL (PF) 5 MG/ML IJ SOLN: 1.0000 mg | INTRAMUSCULAR | Status: DC | PRN

## 2022-11-12 MED ORDER — DEXTROSE 10 % IV SOLN: INTRAVENOUS | Status: DC

## 2022-11-12 MED ORDER — MIDAZOLAM-SODIUM CHLORIDE 100-0.9 MG/100ML-% IV SOLN
2.0000 mg/h | INTRAVENOUS | Status: DC
  Administered 2022-11-12 – 2022-11-13 (×2): 5 mg/h via INTRAVENOUS
  Filled 2022-11-12 (×2): qty 100

## 2022-11-12 MED ORDER — MIDAZOLAM HCL 2 MG/2ML IJ SOLN
2.0000 mg | Freq: Once | INTRAMUSCULAR | Status: AC
  Administered 2022-11-12: 2 mg via INTRAVENOUS
  Filled 2022-11-12: qty 2

## 2022-11-12 NOTE — Progress Notes (Signed)
Hypoglycemic Event  CBG: 66  Treatment: D50 25 mL (12.5 gm)  Symptoms: None  Follow-up CBG: Time:1712 CBG Result:107  Possible Reasons for Event: pt NPO  Comments/MD notified:Katalina Janyth Contes, NP     Renji Berwick, [facility]

## 2022-11-12 NOTE — Progress Notes (Signed)
  Echocardiogram 2D Echocardiogram has been performed.  Gerda Diss 11/12/2022, 5:26 PM

## 2022-11-12 NOTE — Progress Notes (Signed)
Echo attempted. EEG technologist setting up overnight test at time of attempt. Patient also receiving CRRT, limiting space in small room. Will attempt again as conditions permit.

## 2022-11-12 NOTE — Progress Notes (Signed)
eLink Physician-Brief Progress Note Patient Name: Erika Taylor DOB: 03-Oct-1986 MRN: [id number]   Date of Service  11/12/2022  HPI/Events of Note  Multiple issues: 1. Blood glucose has dropped to 121. 2. Troponin = 77 --> 14,267 in the setting of post cardiac arrest/CPR and renal failure. Accuracy? 3. Ca++ = 6.3 which corrects to 7.42 (LOW) given Albumin = 2.6. However, Ca++ X PO4--- product = 73.45. Will avoid Ca++ replacement.   eICU Interventions  Plan: Change NaHCO3 IV infusion to D5NaHCO3 IV infusion.  Repeat Troponin STAT.     Intervention Category Major Interventions: Other:  Magdiel Bartles Dennard Nip 11/12/2022, 2:53 AM

## 2022-11-12 NOTE — Progress Notes (Signed)
Subjective:  still requiring high dose pressors-  pH is normalizing -   no UOP -  3 liters positive Objective Vital signs in last 24 hours: Vitals:   11/12/22 0700 11/12/22 0715 11/12/22 0800 11/12/22 0804  BP:    (!) 124/40  Pulse: 78 82 78 80  Resp: (!) 29 (!) 31 (!) 29 (!) 22  Temp: 99.7 F (37.6 C) 99.9 F (37.7 C) 99.9 F (37.7 C)   TempSrc:      SpO2: 97% 100% 100% 100%  Weight:       Weight change: 6.4 kg  Intake/Output Summary (Last 24 hours) at 11/12/2022 0905 Last data filed at 11/12/2022 0800 Gross per 24 hour  Intake 6867.56 ml  Output 984 ml  Net 5883.56 ml    Assessment/Plan: 36 year old WF with poly substance abuse found pulseless at home-  unclear how long down-  now with  hemodynamic instability on pressors, AKI with metabolic derangements 1.Renal- s/p cardiac arrest -  unclear how long down- now with AKI due to ATN, liver injury, lactic acidosis with hyperkalemia-  decision made to continue support and the only way to correct this acidosis and hyperkalemia is with CRRT-  started 12/8-    bicarb post filter and now 4 K pre and dialysate-   will change to all 4 k baths 2. Hypertension/volume  - hypotensive and evidence of decreased perfusion for a prolonged period of time-  being supported with pressors- keep positive for now 3. Neuro-  exam suspicious for severe anoxic brain injury-  but giving time to see if will improve- neurology involvedserial scans 4. Anemia  - not a major issue 5. Acidosis-  elevated lactate is not coming down yet-  with CRRT bicarb post filter-  better will change to all 4 k bath  6. Hyperkalemia -  resolved -- change post filter to 4 K and see what level is this afternoon  7. Dispo-  not sure what the end point might be-   apparently cannot reliably reach the family      Cecille AverKellie A Domanique Huesman    Labs: Basic Metabolic Panel: Recent Labs  Lab 11/11/22 0859 11/11/22 1135 11/11/22 1640 11/12/22 0000 11/12/22 0155  NA 145   < > 142  140 139  K 4.8   < > 3.9 3.6 3.6  CL 101   < > 95* 92* 91*  CO2 12*   < > 14* 17* 18*  GLUCOSE 278*   < > 250* 112* 73  BUN 23*   < > 24* 23* 21*  CREATININE 4.36*   < > 4.32* 3.80* 3.74*  CALCIUM 8.8*   < > 6.7* 6.3* 6.3*  PHOS >30.0*  --  9.9*  --  7.6*   < > = values in this interval not displayed.   Liver Function Tests: Recent Labs  Lab 2022-08-31 2307 11/11/22 0859 11/11/22 1640 11/12/22 0155  AST 4,600* >10,000*  --  >10,000*  ALT 2,320* 4,666*  --  4,744*  ALKPHOS 56 69  --  111  BILITOT 0.7 1.7*  --  4.1*  PROT 5.7* 4.0*  --  5.0*  ALBUMIN 3.1* 2.2* 2.6* 2.8*  2.8*   No results for input(s): "LIPASE", "AMYLASE" in the last 168 hours. Recent Labs  Lab 11/07/22 0100  AMMONIA 23   CBC: Recent Labs  Lab 11/06/22 2138 11/06/22 2201 11/07/22 0406 2022-08-31 2256 2022-08-31 2307 2022-08-31 2345 11/11/22 0255 11/11/22 0311 11/11/22 0842 11/11/22 0859 11/11/22 1638 11/12/22  0345  WBC 29.5*   < > 15.6*  --  9.0  --  17.9*  --   --  16.9*  --  15.5*  NEUTROABS 27.3*  --   --   --  5.3  --   --   --   --   --   --   --   HGB 13.3   < > 11.6*   < > 12.7   < > 12.5   < > 10.5* 10.2*  --  9.9*  HCT 40.2   < > 36.6   < > 43.3   < > 43.0   < > 31.0* 34.3*  --  28.4*  MCV 100.5*   < > 101.4*  --  113.9*  --  111.1*  --   --  108.2*  --  95.6  PLT 350   < > 279  --  244  --  174  --   --  187  190 175 104*   < > = values in this interval not displayed.   Cardiac Enzymes: Recent Labs  Lab 11/11/22 0859 11/11/22 1640 11/12/22 0000 11/12/22 0155  CKTOTAL 7,421* 12,501* 11,282* 13,194*   CBG: Recent Labs  Lab 11/11/22 2313 11/12/22 0201 11/12/22 0306 11/12/22 0345 11/12/22 0750  GLUCAP 121* 74 64* 107* 75    Iron Studies: No results for input(s): "IRON", "TIBC", "TRANSFERRIN", "FERRITIN" in the last 72 hours. Studies/Results: DG Chest 1 View  Result Date: 11/11/2022 CLINICAL DATA:  Encounter for central line placement EXAM: CHEST  1 VIEW COMPARISON:   11/11/2022 FINDINGS: ET tube tip is above the carina. There is a enteric tube with tip below the level of the GE junction. Interval placement of right IJ catheter with tip in the SVC. No pneumothorax identified. Stable cardiomediastinal contours. The lungs are clear. No pleural fluid or airspace disease. IMPRESSION: Interval placement of right IJ catheter with tip in the SVC. No pneumothorax. Electronically Signed   By: Signa Kell M.D.   On: 11/11/2022 11:20   Overnight EEG with video  Result Date: 11/11/2022 Lind Guest, MD     11/11/2022  8:18 AM EEG Procedure CPT/Type of Study: 95718; 2-12hr EEG with video Referring Provider: Icard Primary Neurological Diagnosis: cardiac arrest History: This is a 35 yr old patient, undergoing an EEG to evaluate for cardiac arrest. Clinical State: comatose Technical Description: The EEG was performed using standard setting per the guidelines of American Clinical Neurophysiology Society (ACNS). A minimum of 21 electrodes were placed on scalp according to the International 10-20 or/and 10-10 Systems. Supplemental electrodes were placed as needed. Single EKG electrode was also used to detect cardiac arrhythmia. Patient's behavior was continuously recorded on video simultaneously with EEG. A minimum of 16 channels were used for data display. Each epoch of study was reviewed manually daily and as needed using standard referential and bipolar montages. Computerized quantitative EEG analysis (such as compressed spectral array analysis, trending, automated spike & seizure detection) were used as indicated. Day 1: from 0439 11/11/22 to 0730 11/11/22 EEG Description: Overall Amplitude: suppressed Predominant Frequency: The background activity showed complete background suppression with some respirator artifact only. Superimposed Frequencies: none The background was symmetric Background Abnormalities: Complete background suppression Rhythmic or periodic pattern: No Epileptiform  activity: no Electrographic seizures: no Events: no Breach rhythm: no Reactivity: Absent Stimulation procedures: Hyperventilation: not done Photic stimulation: not done Sleep Background: none EKG:no significant arrhythmia Impression: This was a markedly abnormal continuous video EEG due  to complete background suppression and absent reactivity, indicative of a severe encephalopathy pattern. No seizures or epileptiform discharges were seen.   DG Chest Port 1 View  Result Date: 11/11/2022 CLINICAL DATA:  Respiratory failure. EXAM: PORTABLE CHEST 1 VIEW COMPARISON:  23-Nov-2022 FINDINGS: 0448 hours. Endotracheal tube tip is 3.3 cm above the base of the carina. The NG tube passes into the stomach although the distal tip position is not included on the film. The lungs are clear without focal pneumonia, edema, pneumothorax or pleural effusion. The cardio pericardial silhouette is enlarged. Telemetry leads overlie the chest. IMPRESSION: Endotracheal tube tip is 3.3 cm above the base of the carina. Interval decompression of gas distended esophagus and stomach with NG tube now in place. Electronically Signed   By: Kennith Center M.D.   On: 11/11/2022 05:36   CT Head Wo Contrast  Result Date: 11/11/2022 CLINICAL DATA:  Arrest, CPR in progress. EXAM: CT HEAD WITHOUT CONTRAST CT CERVICAL SPINE WITHOUT CONTRAST TECHNIQUE: Multidetector CT imaging of the head and cervical spine was performed following the standard protocol without intravenous contrast. Multiplanar CT image reconstructions of the cervical spine were also generated. RADIATION DOSE REDUCTION: This exam was performed according to the departmental dose-optimization program which includes automated exposure control, adjustment of the mA and/or kV according to patient size and/or use of iterative reconstruction technique. COMPARISON:  12/09/2021. FINDINGS: CT HEAD FINDINGS Brain: No acute intracranial hemorrhage, midline shift or mass effect. No extra-axial fluid  collection. Gray-white matter differentiation is within normal limits. No hydrocephalus. Vascular: No hyperdense vessel or unexpected calcification. Skull: Normal. Negative for fracture or focal lesion. Sinuses/Orbits: Diffuse mucosal thickening is noted in the paranasal sinuses. The orbits are within normal limits. Other: Mild subcutaneous fat stranding is noted over the frontal bone on the left anteriorly,, possible small scalp contusion. CT CERVICAL SPINE FINDINGS Alignment: Normal. Skull base and vertebrae: No acute fracture. No primary bone lesion or focal pathologic process. Soft tissues and spinal canal: No prevertebral fluid or swelling. No visible canal hematoma. Disc levels:  Mild endplate osteophyte formation at C5. Upper chest: No acute abnormality. Other: Endotracheal and enteric tubes are noted. IMPRESSION: 1. No acute intracranial process. 2. Mild degenerative changes in the cervical spine without evidence of acute fracture. Electronically Signed   By: Thornell Sartorius M.D.   On: 11/11/2022 03:53   CT Cervical Spine Wo Contrast  Result Date: 11/11/2022 CLINICAL DATA:  Arrest, CPR in progress. EXAM: CT HEAD WITHOUT CONTRAST CT CERVICAL SPINE WITHOUT CONTRAST TECHNIQUE: Multidetector CT imaging of the head and cervical spine was performed following the standard protocol without intravenous contrast. Multiplanar CT image reconstructions of the cervical spine were also generated. RADIATION DOSE REDUCTION: This exam was performed according to the departmental dose-optimization program which includes automated exposure control, adjustment of the mA and/or kV according to patient size and/or use of iterative reconstruction technique. COMPARISON:  12/09/2021. FINDINGS: CT HEAD FINDINGS Brain: No acute intracranial hemorrhage, midline shift or mass effect. No extra-axial fluid collection. Gray-white matter differentiation is within normal limits. No hydrocephalus. Vascular: No hyperdense vessel or unexpected  calcification. Skull: Normal. Negative for fracture or focal lesion. Sinuses/Orbits: Diffuse mucosal thickening is noted in the paranasal sinuses. The orbits are within normal limits. Other: Mild subcutaneous fat stranding is noted over the frontal bone on the left anteriorly,, possible small scalp contusion. CT CERVICAL SPINE FINDINGS Alignment: Normal. Skull base and vertebrae: No acute fracture. No primary bone lesion or focal pathologic process. Soft tissues and spinal  canal: No prevertebral fluid or swelling. No visible canal hematoma. Disc levels:  Mild endplate osteophyte formation at C5. Upper chest: No acute abnormality. Other: Endotracheal and enteric tubes are noted. IMPRESSION: 1. No acute intracranial process. 2. Mild degenerative changes in the cervical spine without evidence of acute fracture. Electronically Signed   By: Thornell Sartorius M.D.   On: 11/11/2022 03:53   EEG adult  Result Date: 11/11/2022 Larkin Ina, MD     11/11/2022  3:26 AM [facility] Stat EEG Report Video Performed: Performed Demographics: Patient Name:   Komal, Stangelo Date of Birth:   03-17-1986 Identification Number:   MRN - [id number] Study Times: Study Start Time:   11/11/2022 02:31:00 Study End Time:   11/11/2022 02:58:00 Duration:   27 minutes Indication(s): Encephalopathy Technical Summary: This EEG was performed utilizing standard International 10-20 System of electrode placement. One channel electrocardiogram was monitored. Data were obtained and interpreted utilizing referential montage recording, with reformatting to longitudinal, transverse bipolar, and referential montages as necessary for interpretation. State(s):      Coma (not medically induced) Activation Procedures: Hyperventilation: Not performed Photic Stimulation: Not performed EEG Description: There is no normal pattern of wake and sleep cycle.  The background is unreactive and consist of burst-suppression  pattern. There is 1-2 seconds of delta slowing during burst intermix with 2-4 seconds of suppression. Study limited by electrode artifacts seen at right posterior leads, P8, P4, and O2.   Event: none reported Impression: This is an abnormal EEG study Clinical Correlation: This is consistent with severe diffuse nonspecific cerebral dysfunction. Burst suppression pattern can be seen with hypothermia, hypoxic-ischemic encephalopathy, anesthesia induced coma, etc. Clinical correlation is advised. Dr Larkin Ina [facility] For Inpatient follow-up with [facility] physician please call [facility] [phone]. This is not an outpatient service. Post hospital discharge, please contact hospital directly.   DG Abd Portable 1V  Result Date: 11/29/2022 CLINICAL DATA:  OG tube EXAM: PORTABLE ABDOMEN - 1 VIEW COMPARISON:  Chest x-ray 11/08/2022 FINDINGS: Orogastric tube tip is in the distal body of the stomach. No dilated bowel loops are seen. There are minimal patchy opacities in the left lower lung. IMPRESSION: 1. Orogastric tube tip is in the distal body of the stomach. 2. There are minimal patchy opacities in the left lower lung. Electronically Signed   By: Darliss Cheney M.D.   On: 11/25/2022 23:57   DG Chest Portable 1 View  Result Date: 11/07/2022 CLINICAL DATA:  Cardiac arrest, CPR, status post intubation. EXAM: PORTABLE CHEST 1 VIEW COMPARISON:  11/06/2022. FINDINGS: Heart is enlarged and the mediastinal contour is stable. The pulmonary vasculature is distended on the left. The proximal esophagus is distended with air. The distal tip of the endotracheal tube terminates 3.3 cm above the carina. Mild atelectasis is present in the perihilar region on the right. No consolidation, effusion, or pneumothorax. The stomach is gas-filled and distended. No acute osseous abnormality. IMPRESSION: 1. Cardiomegaly with mildly distended pulmonary vasculature on the left. 2. Mild atelectasis in the perihilar region on the  right. 3. Endotracheal tube terminates 3.3 cm above the carina. 4. Proximal to mid esophagus and stomach are distended with gas. Electronically Signed   By: Thornell Sartorius M.D.   On: 11/15/2022 23:26   Medications: Infusions:   prismasol BGK 4/2.5 400 mL/hr at 11/11/22 2149   sodium chloride     calcium chloride     ceFEPime (MAXIPIME) IV Stopped (11/11/22 2221)   dextrose     epinephrine 18  mcg/min (11/12/22 0900)   norepinephrine (LEVOPHED) Adult infusion 6 mcg/min (11/12/22 0800)   prismasol BGK 4/2.5 1,500 mL/hr at 11/12/22 0801   sodium bicarbonate 150 mEq in sterile water 1,150 mL infusion 300 mL/hr at 11/12/22 0353   vancomycin     vasopressin 0.04 Units/min (11/12/22 0800)    Scheduled Medications:  Chlorhexidine Gluconate Cloth  6 each Topical Q0600   hydrocortisone sod succinate (SOLU-CORTEF) inj  100 mg Intravenous Q8H   insulin aspart  0-6 Units Subcutaneous Q4H   mouth rinse  15 mL Mouth Rinse Q2H   pantoprazole (PROTONIX) IV  40 mg Intravenous QHS   sodium chloride flush  10-40 mL Intracatheter Q12H    have reviewed scheduled and prn medications.  Physical Exam: General: unresponsive on vent-  no sedation Heart: RRR Lungs: mostly clear Abdomen: distended Extremities: minimal peripheral edema  Dialysis Access: right IJ temp cath placed 12/8    11/12/2022,9:05 AM  LOS: 1 day

## 2022-11-12 NOTE — Progress Notes (Signed)
Pharmacy Antibiotic Note  Erika Taylor is a 36 y.o. female admitted on 11/21/2022 s/p OHCA with unknown down time (possibly >/= 45 minutes) now with sepsis of unclear source.  Pharmacy has been consulted for vancomycin and cefepime dosing. Patient is started on CRRT today.  Afebrile, WBC downtrending.  Weight on admit 91.4 kg.  Plan: Reduce vanc to 1gm IV Q24H Continue cefepime 2gm IV Q12H Monitor CRRT tolerance/interruption, GoC, micro data, vanc trough as indicated  Weight: 97.8 kg (215 lb 9.8 oz)  Temp (24hrs), Avg:98.2 F (36.8 C), Min:97.2 F (36.2 C), Max:99.9 F (37.7 C)  Recent Labs  Lab 11/07/22 0406 11/07/22 0740 12/04/2022 2307 11/11/22 0123 11/11/22 0255 11/11/22 0311 11/11/22 0859 11/11/22 1135 11/11/22 1640 11/11/22 1646 11/11/22 2310 11/12/22 0000 11/12/22 0155 11/12/22 0345  WBC 15.6*  --  9.0  --  17.9*  --  16.9*  --   --   --   --   --   --  15.5*  CREATININE 0.93   < > 3.21* 3.08*  --    < > 4.36* 4.51* 4.32*  --   --  3.80* 3.74*  --   LATICACIDVEN 2.4*   < > >9.0* >9.0*  --   --  >9.0* >9.0*  --  >9.0* >9.0*  --   --   --    < > = values in this interval not displayed.     Estimated Creatinine Clearance: 25.9 mL/min (A) (by C-G formula based on SCr of 3.74 mg/dL (H)).    Allergies  Allergen Reactions   Penicillins Rash    Cefepime 12/8> Vanc 12/8 >   12/8 TA -  12/8 MRSA PCR - negative 12/8 BCx -   Namita Yearwood D. Laney Potash, PharmD, BCPS, BCCCP 11/12/2022, 8:42 AM

## 2022-11-12 NOTE — Progress Notes (Signed)
Hypoglycemic Event  CBG: 62  Treatment: D50 25 mL (12.5 gm)  Symptoms: None  Follow-up CBG: Time:1958 CBG Result:132  Possible Reasons for Event: Inadequate meal intake, disease process     Erika Taylor

## 2022-11-12 NOTE — Procedures (Signed)
EEG Procedure CPT/Type of Study: 95718; 2-12hr EEG with video Referring Provider: Icard Primary Neurological Diagnosis: cardiac arrest  History: This is a 36 yr old patient, undergoing an EEG to evaluate for cardiac arrest. Clinical State: comatose  Technical Description:  The EEG was performed using standard setting per the guidelines of American Clinical Neurophysiology Society (ACNS).  A minimum of 21 electrodes were placed on scalp according to the International 10-20 or/and 10-10 Systems. Supplemental electrodes were placed as needed. Single EKG electrode was also used to detect cardiac arrhythmia. Patient's behavior was continuously recorded on video simultaneously with EEG. A minimum of 16 channels were used for data display. Each epoch of study was reviewed manually daily and as needed using standard referential and bipolar montages. Computerized quantitative EEG analysis (such as compressed spectral array analysis, trending, automated spike & seizure detection) were used as indicated.   Day 2: from 0730 11/11/22 to 1554 11/11/22  EEG Description: Overall Amplitude: suppressed Predominant Frequency: The background activity showed complete background suppression with some respirator artifact only. Superimposed Frequencies: none The background was symmetric  Background Abnormalities: Complete background suppression Rhythmic or periodic pattern: No Epileptiform activity: no Electrographic seizures: no Events: no   Breach rhythm: no  Reactivity: Absent  Stimulation procedures:  Hyperventilation: not done Photic stimulation: not done  Sleep Background: none  EKG:no significant arrhythmia  Impression: This is a markedly abnormal continuous video EEG due to complete background suppression and absent reactivity, indicative of a severe encephalopathy pattern. No seizures or epileptiform discharges were seen.   Windell Norfolk, MD

## 2022-11-12 NOTE — Progress Notes (Signed)
LTM EEG hooked up and running - no initial skin breakdown - push button tested - [facility] monitoring.  

## 2022-11-12 NOTE — Progress Notes (Addendum)
NAME:  Erika Taylor, MRN:  [id number], DOB:  05-12-1986, LOS: 1 ADMISSION DATE:  11/19/2022, CONSULTATION DATE:  11/11/2022 REFERRING MD: Alvy Beal, PA, CHIEF COMPLAINT: Cardiac arrest  History of Present Illness:  This is a 36 year old female, past medical history of depression, arrived via EMS, known history of polysubstance use.  Recently admitted 12/3-12/4 with acute overdose, narcan responsive.   Patient was last seen by family at least around 45 minutes prior to being found unresponsive pulseless and apneic.  She was given Narcan by the fire department and CPR was initiated at 2145 by EMS.  She obtained ROSC but lost pulse x 2.  She was brought to to the emergency department she had a left tibial IO placed with epinephrine infusing.  12/8 with progressive hypotension and acidosis. Nephrology consulted and patient started on CRRT.    Pertinent  Medical History  Polysubstance Abuse  Depression   Significant Hospital Events: Including procedures, antibiotic start and stop dates in addition to other pertinent events   12/7 > Presents to ED post-cardiac arrest. Overdose  12/8 > progressive hypotension and acidosis. Nephrology consulted and patient started on CRRT.   Interim History / Subjective:  This AM on 6 mcg.min levophed and 20 EPI with 0.04 vasopressin.   Objective   Blood pressure (!) 124/40, pulse 80, temperature 99.9 F (37.7 C), resp. rate (!) 22, weight 97.8 kg, SpO2 100 %.    Vent Mode: PSV;CPAP FiO2 (%):  [40 %-50 %] 40 % Set Rate:  [30 bmp] 30 bmp Vt Set:  [530 mL] 530 mL PEEP:  [5 cmH20] 5 cmH20 Pressure Support:  [10 cmH20] 10 cmH20 Plateau Pressure:  [16 cmH20-26 cmH20] 26 cmH20   Intake/Output Summary (Last 24 hours) at 11/12/2022 0951 Last data filed at 11/12/2022 0800 Gross per 24 hour  Intake 6867.56 ml  Output 984 ml  Net 5883.56 ml   Filed Weights   11/11/22 0355 11/12/22 0500  Weight: 91.4 kg 97.8 kg    Examination: General: Critically ill  young female on vent  HENT: ETT, OG in place >> with black output (less then previous day)  Lungs: coarse breath sounds with mild accessory muscle use  Cardiovascular: Regular rate rhythm, no mRG  Abdomen: distended, soft, active bowel sounds  Extremities: generalized edema throughout  Neuro: unresponsive. Pupils 5 mm and non-reactive, no cough, no gag, upper extremities with slight movement noted with nailbed pressure  GU: foley in place   Resolved Hospital Problem list     Assessment & Plan:   Acute metabolic encephalopathy multifactorial in setting of acute drug overdose, post-cardiac arrest with concern for severe anoxic brain injury, +severe metabolic derangements  History of polysubstance abuse -UDS + Opiates, Cocaine, Benzo -CT head with no acute  -LTM no seizures, severe encephalopathy  Plan  - Neurology Following  - Treatment as below   Profound vasoplegic shock -Recent BC +STREPTOCOCCUS MITIS/ORALIS  Cardiac Arrest with unclear down-time in setting of acute overdose  Elevated Troponin  Plan - Cardiac Monitoring  - Continue Vasopressors and wean for Systolic goal >100 (goal to wean EPI today)  - Follow Culture Data  - Continue Cefepime/Vancomycin  - Trend Troponin - Obtain ECHO  - Continue stress dose steroids   Respiratory Insufficieny in setting of post cardiac arrest, encephalopathy  Plan - Continue Vent support >> can PS as tolerates, however neuro examination barrier to extubation  - Trend ABG/CXR   Severe Metabolic Acidosis, Lactic Acidosis, Hyperkalemia, AKI Hypocalcemia  Rhabdomyolysis with unknown downtime  Plan  - Nephrology following  - Continue CRRT  - D/C Bicarb gtt  - Trend BMP - Ionized Cal pending  - Trend CK, Lactic Acid   Coagulopathy in setting of Liver Shock in setting of cardiac arrest with hypoglycemia  - S/P 2 FFP and 10 Vitamin K  Plan - Trend LFT - Trend DIC panel  - Change Bicarb to Dextrose gtt. Trend glucose   Best  Practice (right click and "Reselect all SmartList Selections" daily)   Diet/type: NPO DVT prophylaxis: prophylactic heparin  GI prophylaxis: PPI Lines: Central line, Dialysis Catheter, and Arterial Line Foley:  Yes, and it is still needed Code Status:  full code Last date of multidisciplinary goals of care discussion have attempted multiple times to call family with no answer.   Labs   CBC: Recent Labs  Lab 11/06/22 2138 11/06/22 2201 11/07/22 0406 11/29/2022 2256 11/18/2022 2307 11/13/2022 2345 11/11/22 0255 11/11/22 0311 11/11/22 0826 11/11/22 0842 11/11/22 0859 11/11/22 1638 11/12/22 0345 11/12/22 0849  WBC 29.5*   < > 15.6*  --  9.0  --  17.9*  --   --   --  16.9*  --  15.5*  --   NEUTROABS 27.3*  --   --   --  5.3  --   --   --   --   --   --   --   --   --   HGB 13.3   < > 11.6*   < > 12.7   < > 12.5   < > 11.6* 10.5* 10.2*  --  9.9* 9.5*  HCT 40.2   < > 36.6   < > 43.3   < > 43.0   < > 34.0* 31.0* 34.3*  --  28.4* 28.0*  MCV 100.5*   < > 101.4*  --  113.9*  --  111.1*  --   --   --  108.2*  --  95.6  --   PLT 350   < > 279  --  244  --  174  --   --   --  187  190 175 104*  --    < > = values in this interval not displayed.    Basic Metabolic Panel: Recent Labs  Lab 11/06/22 2328 11/07/22 0017 11/11/22 0255 11/11/22 0311 11/11/22 0859 11/11/22 1135 11/11/22 1640 11/12/22 0000 11/12/22 0155 11/12/22 0849  NA  --    < >  --    < > 145 141 142 140 139 133*  K  --    < >  --    < > 4.8 4.4 3.9 3.6 3.6 3.8  CL  --    < >  --    < > 101 100 95* 92* 91*  --   CO2  --    < >  --   --  12* 10* 14* 17* 18*  --   GLUCOSE  --    < >  --    < > 278* 336* 250* 112* 73  --   BUN  --    < >  --    < > 23* 25* 24* 23* 21*  --   CREATININE  --    < >  --    < > 4.36* 4.51* 4.32* 3.80* 3.74*  --   CALCIUM  --    < >  --   --  8.8* 6.4* 6.7* 6.3* 6.3*  --   MG  2.1  --  3.0*  --  2.5*  --   --   --  2.2  --   PHOS  --   --  >30.0*  --  >30.0*  --  9.9*  --  7.6*  --    < > =  values in this interval not displayed.   GFR: Estimated Creatinine Clearance: 25.9 mL/min (A) (by C-G formula based on SCr of 3.74 mg/dL (H)). Recent Labs  Lab 11/06/22 2328 11/07/22 0017 11/20/2022 2307 11/11/22 0123 11/11/22 0255 11/11/22 0859 11/11/22 1135 11/11/22 1646 11/11/22 2310 11/12/22 0345  PROCALCITON 1.64  --   --   --   --   --   --   --   --   --   WBC  --    < > 9.0  --  17.9* 16.9*  --   --   --  15.5*  LATICACIDVEN  --    < > >9.0*   < >  --  >9.0* >9.0* >9.0* >9.0*  --    < > = values in this interval not displayed.    Liver Function Tests: Recent Labs  Lab 11/06/22 2138 11/07/22 0406 12/04/2022 2307 11/11/22 0859 11/11/22 1640 11/12/22 0155  AST 31 24 4,600* >10,000*  --  >10,000*  ALT 18 16 2,320* 4,666*  --  4,744*  ALKPHOS 49 38 56 69  --  111  BILITOT 0.2* 0.4 0.7 1.7*  --  4.1*  PROT 6.4* 6.3* 5.7* 4.0*  --  5.0*  ALBUMIN 3.7 3.5 3.1* 2.2* 2.6* 2.8*  2.8*   No results for input(s): "LIPASE", "AMYLASE" in the last 168 hours. Recent Labs  Lab 11/07/22 0100  AMMONIA 23    ABG    Component Value Date/Time   PHART 7.325 (L) 11/12/2022 0849   PCO2ART 50.9 (H) 11/12/2022 0849   PO2ART 105 11/12/2022 0849   HCO3 26.5 11/12/2022 0849   TCO2 28 11/12/2022 0849   ACIDBASEDEF 17.8 (H) 11/11/2022 1510   O2SAT 97 11/12/2022 0849     Coagulation Profile: Recent Labs  Lab 11/11/22 0859 11/11/22 1638  INR 5.2* 4.7*    Cardiac Enzymes: Recent Labs  Lab 11/11/22 0859 11/11/22 1640 11/12/22 0000 11/12/22 0155  CKTOTAL 7,421* 12,501* 11,282* 13,194*    HbA1C: Hgb A1c MFr Bld  Date/Time Value Ref Range Status  11/07/2022 01:00 AM 5.3 4.8 - 5.6 % Final    Comment:    (NOTE)         Prediabetes: 5.7 - 6.4         Diabetes: >6.4         Glycemic control for adults with diabetes: <7.0   01/13/2021 06:30 AM 5.5 4.8 - 5.6 % Final    Comment:    (NOTE) Pre diabetes:          5.7%-6.4%  Diabetes:              >6.4%  Glycemic control  for   <7.0% adults with diabetes     CBG: Recent Labs  Lab 11/11/22 2313 11/12/22 0201 11/12/22 0306 11/12/22 0345 11/12/22 0750  GLUCAP 121* 74 64* 107* 75    Review of Systems:   Critically ill, unable to obtain complete ROS.   CRITICAL CARE Performed by: Tobey Grim   Total critical care time: 55 minutes  Critical care time was exclusive of separately billable procedures and treating other patients.  Critical care was necessary to treat or prevent imminent  or life-threatening deterioration.  Critical care was time spent personally by me on the following activities: development of treatment plan with patient and/or surrogate as well as nursing, discussions with consultants, evaluation of patient's response to treatment, examination of patient, obtaining history from patient or surrogate, ordering and performing treatments and interventions, ordering and review of laboratory studies, ordering and review of radiographic studies, pulse oximetry and re-evaluation of patient's condition.

## 2022-11-12 NOTE — Progress Notes (Signed)
eLink Physician-Brief Progress Note Patient Name: Erika Taylor DOB: Apr 09, 1986 MRN: [id number]   Date of Service  11/12/2022  HPI/Events of Note  Ionized calcium remains low at 0.88  eICU Interventions  Placed order for calcium gluconate 2gm IV.  Will continue to monitor serial labs.         Dedra Matsuo M DELA CRUZ 11/12/2022, 9:15 PM

## 2022-11-12 NOTE — Progress Notes (Signed)
Neurology Progress Note  Brief HPI: Patient with history of polysubstance abuse and depression initially presented unresponsive and pulseless with concern for overdose.  She was found pulseless and apneic by family at home, received Narcan and CPR with EMS.  ROSC was achieved, but pulse was lost 3 times and then regained.  On exam, patient is unresponsive despite no sedation.  Urine toxicology was positive for cocaine, opiates and benzos.  Subjective: Patient remains in the ICU and is now on CRRT.  Pressor doses have been decreased since yesterday.  She continues to have some spontaneous respirations.  Left-sided facial twitching was noticed periodically yesterday, but this has ceased.  When applying nailbed pressure to the right hand, shaking of the right hand for about 30 seconds is noted.  Exam: Vitals:   11/12/22 0945 11/12/22 1000  BP:    Pulse: 77 79  Resp: 15 13  Temp: 99.9 F (37.7 C) 99.9 F (37.7 C)  SpO2: 100% 100%   Gen: In bed, NAD Resp: Respirations synchronous with ventilator  Neuro (on no sedation): Pupils 4 mm, and midposition and fixed, weak oculocephalic reflex, no corneal reflex, no cough, patient continues to trigger breaths on the ventilator.  No response to noxious stimuli in left upper extremity and bilateral lower extremities, however with nailbed pressure in right upper extremity, patient begins to shake right arm for about 30 seconds.   Pertinent Labs:    Latest Ref Rng & Units 11/12/2022    8:49 AM 11/12/2022    3:45 AM 11/11/2022    4:38 PM  CBC  WBC 4.0 - 10.5 K/uL  15.5    Hemoglobin 12.0 - 15.0 g/dL 9.5  9.9    Hematocrit 36.0 - 46.0 % 28.0  28.4    Platelets 150 - 400 K/uL  104  175        Latest Ref Rng & Units 11/12/2022    8:49 AM 11/12/2022    1:55 AM 11/12/2022   12:00 AM  BMP  Glucose 70 - 99 mg/dL  73  557   BUN 6 - 20 mg/dL  21  23   Creatinine 3.22 - 1.00 mg/dL  0.25  4.27   Sodium 062 - 145 mmol/L 133  139  140   Potassium 3.5 - 5.1  mmol/L 3.8  3.6  3.6   Chloride 98 - 111 mmol/L  91  92   CO2 22 - 32 mmol/L  18  17   Calcium 8.9 - 10.3 mg/dL  6.3  6.3    ABG    Component Value Date/Time   PHART 7.325 (L) 11/12/2022 0849   PCO2ART 50.9 (H) 11/12/2022 0849   PO2ART 105 11/12/2022 0849   HCO3 26.5 11/12/2022 0849   TCO2 28 11/12/2022 0849   ACIDBASEDEF 17.8 (H) 11/11/2022 1510   O2SAT 97 11/12/2022 0849     Imaging Reviewed: CT head: Reading states no acute abnormality, however ventricles look small and crowded.  Assessment: 36 year old patient with history of polysubstance abuse and depression was admitted after being found pulseless and apneic at home.  She was given Narcan by EMS, and CPR was performed with ROSC achieved however pulse was lost subsequently 3 times.  Pupils are midline fixed and nonreactive, and patient has no response to noxious stimuli.  Neurology consulted for prognostication.  High suspicion for significant anoxic brain injury.  Patient is currently in the ICU and is rather unstable on high doses of pressors and will be starting CRRT today.  Read on EEG reveals complete background suppression and absent reactivity but no seizure activity.  Would like to repeat CT scan of head given midline, fixed pupils and crowded appearance of prior head CT, however patient is too unstable for this right now.  Will need to correct metabolic abnormalities before being able to give prognosis based on neuro exam.  Patient will need MRI between day 3 and 5.  Impression: High suspicion for significant anoxic brain injury and postcardiac arrest patient  Recommendations: 1) MRI brain between day 3 and 5 2) avoid hypotension, hyperthermia and hyponatremia 3) neurology will continue to follow for prognostication  Cortney E Ernestina Columbia , MSN, AGACNP-BC [facility] See Amion for schedule and pager information 11/12/2022 10:10 AM   Attending Neurohospitalist Addendum Patient seen and examined with  APP/Resident. Agree with the history and physical as documented above. Agree with the plan as documented, which I helped formulate. I have edited the note above to reflect my full findings and recommendations. I have independently reviewed the chart, obtained history, review of systems and examined the patient.I have personally reviewed pertinent head/neck/spine imaging (CT/MRI). Please feel free to call with any questions.  cEEG showed electrographic status epilepticus. Patient given 4mg  IV ativan, keppra load 60mg /kg with maint dosing of 1000mg  bid (CRRT dosing) and started on versed gtt. Will continue to follow.  This patient is critically ill and at significant risk of neurological worsening, death and care requires constant monitoring of vital signs, hemodynamics,respiratory and cardiac monitoring, neurological assessment, discussion with family, other specialists and medical decision making of high complexity. I spent 70 minutes of neurocritical care time  in the care of  this patient. This was time spent independent of any time provided by nurse practitioner or PA.  , MD [facility] [phone]  If 7pm- 7am, please page neurology on call as listed in AMION.

## 2022-11-12 NOTE — Progress Notes (Signed)
[facility] now monitoring, and notified that pt has MRI leads on

## 2022-11-13 DIAGNOSIS — I469 Cardiac arrest, cause unspecified: Secondary | ICD-10-CM | POA: Diagnosis not present

## 2022-11-13 DIAGNOSIS — G40901 Epilepsy, unspecified, not intractable, with status epilepticus: Secondary | ICD-10-CM | POA: Diagnosis not present

## 2022-11-13 LAB — GLUCOSE, CAPILLARY
Glucose-Capillary: 119 mg/dL — ABNORMAL HIGH (ref 70–99)
Glucose-Capillary: 102 mg/dL — ABNORMAL HIGH (ref 70–99)
Glucose-Capillary: 103 mg/dL — ABNORMAL HIGH (ref 70–99)
Glucose-Capillary: 100 mg/dL — ABNORMAL HIGH (ref 70–99)
Glucose-Capillary: 94 mg/dL (ref 70–99)
Glucose-Capillary: 114 mg/dL — ABNORMAL HIGH (ref 70–99)
Glucose-Capillary: 123 mg/dL — ABNORMAL HIGH (ref 70–99)
Glucose-Capillary: 118 mg/dL — ABNORMAL HIGH (ref 70–99)
Glucose-Capillary: 124 mg/dL — ABNORMAL HIGH (ref 70–99)
Glucose-Capillary: 113 mg/dL — ABNORMAL HIGH (ref 70–99)

## 2022-11-13 LAB — CBC
HCT: 25.7 % — ABNORMAL LOW (ref 36.0–46.0)
Hemoglobin: 8.4 g/dL — ABNORMAL LOW (ref 12.0–15.0)
MCH: 32.7 pg (ref 26.0–34.0)
MCHC: 32.7 g/dL (ref 30.0–36.0)
MCV: 100 fL (ref 80.0–100.0)
Platelets: 49 10*3/uL — ABNORMAL LOW (ref 150–400)
RBC: 2.57 MIL/uL — ABNORMAL LOW (ref 3.87–5.11)
RDW: 12.6 % (ref 11.5–15.5)
WBC: 14 10*3/uL — ABNORMAL HIGH (ref 4.0–10.5)
nRBC: 10.2 % — ABNORMAL HIGH (ref 0.0–0.2)

## 2022-11-13 LAB — DIC (DISSEMINATED INTRAVASCULAR COAGULATION)PANEL
D-Dimer, Quant: >20 ug/mL-FEU — ABNORMAL HIGH (ref 0.00–0.50)
Fibrinogen: 118 mg/dL — ABNORMAL LOW (ref 210–475)
INR: 5.6 (ref 0.8–1.2)
Platelets: 57 10*3/uL — ABNORMAL LOW (ref 150–400)
Prothrombin Time: 50.2 seconds — ABNORMAL HIGH (ref 11.4–15.2)
Smear Review: NONE SEEN
aPTT: 55 seconds — ABNORMAL HIGH (ref 24–36)

## 2022-11-13 LAB — CULTURE, RESPIRATORY W GRAM STAIN

## 2022-11-13 LAB — BASIC METABOLIC PANEL
Anion gap: 19 — ABNORMAL HIGH (ref 5–15)
Anion gap: 19 — ABNORMAL HIGH (ref 5–15)
Anion gap: 13 (ref 5–15)
BUN: 20 mg/dL (ref 6–20)
BUN: 20 mg/dL (ref 6–20)
BUN: 18 mg/dL (ref 6–20)
CO2: 17 mmol/L — ABNORMAL LOW (ref 22–32)
CO2: 17 mmol/L — ABNORMAL LOW (ref 22–32)
CO2: 19 mmol/L — ABNORMAL LOW (ref 22–32)
Calcium: 7.3 mg/dL — ABNORMAL LOW (ref 8.9–10.3)
Calcium: 7 mg/dL — ABNORMAL LOW (ref 8.9–10.3)
Calcium: 7.2 mg/dL — ABNORMAL LOW (ref 8.9–10.3)
Chloride: 97 mmol/L — ABNORMAL LOW (ref 98–111)
Chloride: 98 mmol/L (ref 98–111)
Chloride: 99 mmol/L (ref 98–111)
Creatinine, Ser: 2.91 mg/dL — ABNORMAL HIGH (ref 0.44–1.00)
Creatinine, Ser: 2.8 mg/dL — ABNORMAL HIGH (ref 0.44–1.00)
Creatinine, Ser: 2.53 mg/dL — ABNORMAL HIGH (ref 0.44–1.00)
GFR, Estimated: 21 mL/min — ABNORMAL LOW (ref >60)
GFR, Estimated: 22 mL/min — ABNORMAL LOW (ref >60)
GFR, Estimated: 25 mL/min — ABNORMAL LOW (ref >60)
Glucose, Bld: 112 mg/dL — ABNORMAL HIGH (ref 70–99)
Glucose, Bld: 102 mg/dL — ABNORMAL HIGH (ref 70–99)
Glucose, Bld: 117 mg/dL — ABNORMAL HIGH (ref 70–99)
Potassium: 5.2 mmol/L — ABNORMAL HIGH (ref 3.5–5.1)
Potassium: 5.8 mmol/L — ABNORMAL HIGH (ref 3.5–5.1)
Potassium: 6.9 mmol/L (ref 3.5–5.1)
Sodium: 133 mmol/L — ABNORMAL LOW (ref 135–145)
Sodium: 134 mmol/L — ABNORMAL LOW (ref 135–145)
Sodium: 131 mmol/L — ABNORMAL LOW (ref 135–145)

## 2022-11-13 LAB — RENAL FUNCTION PANEL
Albumin: 3.2 g/dL — ABNORMAL LOW (ref 3.5–5.0)
Albumin: 3.2 g/dL — ABNORMAL LOW (ref 3.5–5.0)
Anion gap: 20 — ABNORMAL HIGH (ref 5–15)
Anion gap: 14 (ref 5–15)
BUN: 19 mg/dL (ref 6–20)
BUN: 19 mg/dL (ref 6–20)
CO2: 18 mmol/L — ABNORMAL LOW (ref 22–32)
CO2: 20 mmol/L — ABNORMAL LOW (ref 22–32)
Calcium: 7.1 mg/dL — ABNORMAL LOW (ref 8.9–10.3)
Calcium: 6.6 mg/dL — ABNORMAL LOW (ref 8.9–10.3)
Chloride: 97 mmol/L — ABNORMAL LOW (ref 98–111)
Chloride: 99 mmol/L (ref 98–111)
Creatinine, Ser: 2.69 mg/dL — ABNORMAL HIGH (ref 0.44–1.00)
Creatinine, Ser: 2.5 mg/dL — ABNORMAL HIGH (ref 0.44–1.00)
GFR, Estimated: 23 mL/min — ABNORMAL LOW (ref >60)
GFR, Estimated: 25 mL/min — ABNORMAL LOW (ref >60)
Glucose, Bld: 97 mg/dL (ref 70–99)
Glucose, Bld: 128 mg/dL — ABNORMAL HIGH (ref 70–99)
Phosphorus: 5.9 mg/dL — ABNORMAL HIGH (ref 2.5–4.6)
Phosphorus: 7.4 mg/dL — ABNORMAL HIGH (ref 2.5–4.6)
Potassium: 6 mmol/L — ABNORMAL HIGH (ref 3.5–5.1)
Potassium: 6.7 mmol/L (ref 3.5–5.1)
Sodium: 135 mmol/L (ref 135–145)
Sodium: 133 mmol/L — ABNORMAL LOW (ref 135–145)

## 2022-11-13 LAB — LACTIC ACID, PLASMA: Lactic Acid, Venous: 8.4 mmol/L (ref 0.5–1.9)

## 2022-11-13 LAB — CK
Total CK: 22392 U/L — ABNORMAL HIGH (ref 38–234)
Total CK: 33719 U/L — ABNORMAL HIGH (ref 38–234)
Total CK: 47891 U/L — ABNORMAL HIGH (ref 38–234)
Total CK: 48292 U/L — ABNORMAL HIGH (ref 38–234)

## 2022-11-13 LAB — HEPATIC FUNCTION PANEL
ALT: 3757 U/L — ABNORMAL HIGH (ref 0–44)
AST: >10000 U/L — ABNORMAL HIGH (ref 15–41)
Albumin: 3 g/dL — ABNORMAL LOW (ref 3.5–5.0)
Alkaline Phosphatase: 123 U/L (ref 38–126)
Bilirubin, Direct: 4 mg/dL — ABNORMAL HIGH (ref 0.0–0.2)
Indirect Bilirubin: 2.5 mg/dL — ABNORMAL HIGH (ref 0.3–0.9)
Total Bilirubin: 6.5 mg/dL — ABNORMAL HIGH (ref 0.3–1.2)
Total Protein: 4.8 g/dL — ABNORMAL LOW (ref 6.5–8.1)

## 2022-11-13 LAB — TROPONIN I (HIGH SENSITIVITY)
Troponin I (High Sensitivity): 11120 ng/L (ref <18)
Troponin I (High Sensitivity): 12065 ng/L (ref <18)
Troponin I (High Sensitivity): 9072 ng/L (ref <18)

## 2022-11-13 LAB — ECHOCARDIOGRAM LIMITED
S' Lateral: 2.7 cm
Weight: 3449.76 oz

## 2022-11-13 LAB — MAGNESIUM: Magnesium: 2.9 mg/dL — ABNORMAL HIGH (ref 1.7–2.4)

## 2022-11-13 LAB — CALCIUM, IONIZED
Calcium, Ionized, Serum: 3.2 mg/dL — ABNORMAL LOW (ref 4.5–5.6)
Calcium, Ionized, Serum: <3 mg/dL — ABNORMAL LOW (ref 4.5–5.6)

## 2022-11-13 MED ORDER — PRISMASOL BGK 0/2.5 32-2.5 MEQ/L EC SOLN
Status: DC
  Filled 2022-11-13 (×2): qty 5000

## 2022-11-13 MED ORDER — PRISMASOL BGK 0/2.5 32-2.5 MEQ/L EC SOLN
Status: DC
  Filled 2022-11-13 (×11): qty 5000

## 2022-11-13 MED ORDER — PRISMASOL BGK 0/2.5 32-2.5 MEQ/L REPLACEMENT SOLN
Status: DC
  Filled 2022-11-13 (×3): qty 5000

## 2022-11-13 MED ORDER — SODIUM CHLORIDE 0.9 % IV SOLN
4.0000 g | Freq: Once | INTRAVENOUS | Status: AC
  Administered 2022-11-13: 4 g via INTRAVENOUS
  Filled 2022-11-13: qty 40

## 2022-11-13 MED ORDER — NEPRO/CARBSTEADY PO LIQD
1000.0000 mL | ORAL | Status: DC
  Administered 2022-11-13: 1000 mL
  Filled 2022-11-13: qty 1000

## 2022-11-13 NOTE — Progress Notes (Signed)
Subjective:  still requiring high dose pressors but weaning some-  is hypothermic and hypoglycemic-  CRRT machine running well -  k has been creeping up -  positive 1200   Objective Vital signs in last 24 hours: Vitals:   11/13/22 0630 11/13/22 0645 11/13/22 0700 11/13/22 0715  BP:      Pulse: 64 63 63 64  Resp: (!) 30 (!) 27 (!) 27 (!) 24  Temp: (!) 96.4 F (35.8 C) (!) 96.4 F (35.8 C) (!) 96.4 F (35.8 C) (!) 96.4 F (35.8 C)  TempSrc:      SpO2: 100% 100% 100% 100%  Weight:       Weight change: 3.6 kg  Intake/Output Summary (Last 24 hours) at 11/13/2022 0856 Last data filed at 11/13/2022 0700 Gross per 24 hour  Intake 3760.27 ml  Output 533 ml  Net 3227.27 ml    Assessment/Plan: 36 year old WF with poly substance abuse found pulseless at home-  unclear how long down-  now with  hemodynamic instability on pressors, AKI with metabolic derangements 1.Renal- s/p cardiac arrest -  unclear how long down- now with AKI due to ATN, liver injury, lactic acidosis with hyperkalemia-  decision made to continue support -  CRRT started 12/8-   initially  bicarb post filter and  4 K pre and dialysate-   changed to all 4 k baths yest- K creeping up -  will change dialysate to 2 K -  dont want to change all bags 2. Hypertension/volume  - hypotensive and evidence of decreased perfusion for a prolonged period of time-  being supported with pressors- ran positive for a time-  will now change to keeping even 3. Neuro-  exam suspicious for severe anoxic brain injury-  but giving time to see if will improve- neurology involvedserial scans 4. Anemia  - not a major issue 5. Acidosis-  elevated lactate is not coming down yet-  with CRRT bicarb post filter-  better will change to all 4 k bath  6. Hyperkalemia -   had resolved -- changed to 4 K yest-  now creeping back up-  change dialysate to 2 K and as long as K is under 6 with PM lab we are ok  7. Dispo-  not sure what the end point might be-    apparently cannot reliably reach the family      Cecille Aver    Labs: Basic Metabolic Panel: Recent Labs  Lab 11/12/22 0155 11/12/22 0849 11/12/22 1709 11/12/22 1736 11/12/22 2310 11/13/22 0325 11/13/22 0507  NA 139   < > 135   < > 133* 134* 135  K 3.6   < > 4.8   < > 5.2* 5.8* 6.0*  CL 91*   < > 95*  --  97* 98 97*  CO2 18*   < > 20*  --  17* 17* 18*  GLUCOSE 73   < > 99  --  112* 102* 97  BUN 21*   < > 21*  --  20 20 19   CREATININE 3.74*   < > 3.15*  --  2.91* 2.80* 2.69*  CALCIUM 6.3*   < > 7.1*  --  7.3* 7.0* 7.1*  PHOS 7.6*  --  6.8*  --   --   --  5.9*   < > = values in this interval not displayed.   Liver Function Tests: Recent Labs  Lab 11/11/22 0859 11/11/22 1640 11/12/22 0155 11/12/22 1709 11/13/22 0325  11/13/22 0507  AST >10,000*  --  >10,000*  --  >10,000*  --   ALT 4,666*  --  4,744*  --  3,757*  --   ALKPHOS 69  --  111  --  123  --   BILITOT 1.7*  --  4.1*  --  6.5*  --   PROT 4.0*  --  5.0*  --  4.8*  --   ALBUMIN 2.2*   < > 2.8*  2.8* 2.7* 3.0* 3.2*   < > = values in this interval not displayed.   No results for input(s): "LIPASE", "AMYLASE" in the last 168 hours. Recent Labs  Lab 11/07/22 0100  AMMONIA 23   CBC: Recent Labs  Lab 11/06/22 2138 11/06/22 2201 11/07/22 0406 11/18/22 2256 11/18/22 2307 11-18-2022 2345 11/11/22 0255 11/11/22 0311 11/11/22 0859 11/11/22 1638 11/12/22 0345 11/12/22 0849 11/12/22 1315 11/12/22 1736  WBC 29.5*   < > 15.6*  --  9.0  --  17.9*  --  16.9*  --  15.5*  --   --   --   NEUTROABS 27.3*  --   --   --  5.3  --   --   --   --   --   --   --   --   --   HGB 13.3   < > 11.6*   < > 12.7   < > 12.5   < > 10.2*  --  9.9* 9.5*  --  8.8*  HCT 40.2   < > 36.6   < > 43.3   < > 43.0   < > 34.3*  --  28.4* 28.0*  --  26.0*  MCV 100.5*   < > 101.4*  --  113.9*  --  111.1*  --  108.2*  --  95.6  --   --   --   PLT 350   < > 279  --  244  --  174  --  187  190 175 104*  --  93*  --    < > = values  in this interval not displayed.   Cardiac Enzymes: Recent Labs  Lab 11/12/22 0000 11/12/22 0155 11/12/22 1315 11/12/22 2310 11/13/22 0325  CKTOTAL 11,282* 13,194* 18,962* 22,392* 33,719*   CBG: Recent Labs  Lab 11/12/22 2308 11/13/22 0204 11/13/22 0322 11/13/22 0506 11/13/22 0809  GLUCAP 119* 102* 103* 100* 94    Iron Studies: No results for input(s): "IRON", "TIBC", "TRANSFERRIN", "FERRITIN" in the last 72 hours. Studies/Results: Overnight EEG with video  Result Date: 11/13/2022 Windell Norfolk, MD     11/13/2022  8:46 AM EEG Procedure CPT/Type of Study: 95718; 2-12hr EEG with video Referring Provider: Ernestina Columbia Primary Neurological Diagnosis: cardiac arrest History: This is a 36 yr old patient, undergoing an EEG to evaluate for cardiac arrest. Clinical State: comatose Technical Description: The EEG was performed using standard setting per the guidelines of American Clinical Neurophysiology Society (ACNS). A minimum of 21 electrodes were placed on scalp according to the International 10-20 or/and 10-10 Systems. Supplemental electrodes were placed as needed. Single EKG electrode was also used to detect cardiac arrhythmia. Patient's behavior was continuously recorded on video simultaneously with EEG. A minimum of 16 channels were used for data display. Each epoch of study was reviewed manually daily and as needed using standard referential and bipolar montages. Computerized quantitative EEG analysis (such as compressed spectral array analysis, trending, automated spike & seizure detection) were used as indicated.  Day 1: from 1130 11/12/22 to 0730 11/13/22 EEG Description: Overall Amplitude: Low Predominant Frequency: Delta  Superimposed Frequencies: none The background was symmetric Background Abnormalities: Diffuse slowing Rhythmic or periodic pattern: No Epileptiform activity: no Electrographic seizures: Yes, described as 3 to 3.5 Hz generalized discharges Events: no Breach rhythm: no  Reactivity: Absent Stimulation procedures: Hyperventilation: not done Photic stimulation: not done Sleep Background: none EKG:no significant arrhythmia Impression: This is a markedly abnormal continuous video EEG due to a presence of a prolong seizure 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 After administration of medications, background changed to moderate diffuse slowing. Windell Norfolk, MD   DG Chest 1 View  Result Date: 11/11/2022 CLINICAL DATA:  Encounter for central line placement EXAM: CHEST  1 VIEW COMPARISON:  11/11/2022 FINDINGS: ET tube tip is above the carina. There is a enteric tube with tip below the level of the GE junction. Interval placement of right IJ catheter with tip in the SVC. No pneumothorax identified. Stable cardiomediastinal contours. The lungs are clear. No pleural fluid or airspace disease. IMPRESSION: Interval placement of right IJ catheter with tip in the SVC. No pneumothorax. Electronically Signed   By: Signa Kell M.D.   On: 11/11/2022 11:20   Medications: Infusions:   prismasol BGK 4/2.5 400 mL/hr at 11/12/22 2329    prismasol BGK 4/2.5 400 mL/hr at 11/12/22 2219   sodium chloride     ceFEPime (MAXIPIME) IV Stopped (11/12/22 2139)   dextrose 50 mL/hr at 11/13/22 0700   levETIRAcetam Stopped (11/13/22 0217)   midazolam 5 mg/hr (11/13/22 0700)   norepinephrine (LEVOPHED) Adult infusion 10 mcg/min (11/13/22 0700)   phytonadione (VITAMIN K) 10 mg in dextrose 5 % 50 mL IVPB Stopped (11/12/22 1844)   prismasol BGK 4/2.5 1,500 mL/hr at 11/13/22 0412   vancomycin Stopped (11/12/22 1135)   vasopressin 0.04 Units/min (11/13/22 0700)    Scheduled Medications:  Chlorhexidine Gluconate Cloth  6 each Topical Q0600   hydrocortisone sod succinate (SOLU-CORTEF) inj  100 mg Intravenous Q8H   insulin aspart  0-6 Units Subcutaneous Q4H   mouth rinse  15 mL Mouth Rinse Q2H   pantoprazole (PROTONIX) IV  40 mg Intravenous QHS   sodium chloride flush  10-40  mL Intracatheter Q12H    have reviewed scheduled and prn medications.  Physical Exam: General: unresponsive on vent-  no sedation Heart: RRR Lungs: mostly clear Abdomen: distended Extremities: minimal peripheral edema  Dialysis Access: right IJ temp cath placed 12/8    11/13/2022,8:56 AM  LOS: 2 days

## 2022-11-13 NOTE — Progress Notes (Signed)
NUTRITION NOTE RD working remotely.  Message received from CCM NP who shares plan to initiate trickle tube feeds via OGT today. Patient was last assessed in person by a [place] RD on 12/8 at which time recommendation for tube feeding regimen was Vital 1.5 @ 20 ml/hr to advance by 10 ml every 8 hours to goal rate of 60 ml/hr with 60 ml Prosource TF20 BID.   NP shares patient with persistent hyperkalemia despite CRRT since 12/8. RD able to review labs from 12/8-12/10 and noted that K, Mg, and Phos all trending up since initiation of CRRT.   Today, K: 6.7 mmol/l, Mg: 2.9 mg/dl, and Phos: 7.4 mg/dl.  Updated tube feeding recommendation: Nepro @ 15 ml/hr to advance by 10 ml every 8 hours to reach goal rate of 45 ml/hr with 60 ml Prosource TF20 TID. At goal rate, this regimen will provide 2184 kcal, 147 grams protein, 785 ml water, 1025 mg K, 182 mg Mg, and 774 mg Phos.   Estimated Nutritional Needs:  Kcal:  2100-2300 Protein:  130-150 grams Fluid:  >2.0 L    Trenton Gammon, MS, RD, LDN, CNSC Clinical Dietitian PRN/Relief staff On-call/weekend pager # available in [facility]

## 2022-11-13 NOTE — Progress Notes (Addendum)
NAME:  Erika Taylor, MRN:  [id number], DOB:  17-Jun-1986, LOS: 2 ADMISSION DATE:  2022/12/07, CONSULTATION DATE:  11/11/2022 REFERRING MD: Alvy Beal, PA, CHIEF COMPLAINT: Cardiac arrest  History of Present Illness:  This is a 36 year old female, past medical history of depression, arrived via EMS, known history of polysubstance use.  Recently admitted 12/3-12/4 with acute overdose, narcan responsive.   Patient was last seen by family at least around 45 minutes prior to being found unresponsive pulseless and apneic.  She was given Narcan by the fire department and CPR was initiated at 2145 by EMS.  She obtained ROSC but lost pulse x 2.  She was brought to to the emergency department she had a left tibial IO placed with epinephrine infusing.  12/8 with progressive hypotension and acidosis. Nephrology consulted and patient started on CRRT.   Pertinent  Medical History  Polysubstance Abuse  Depression   Significant Hospital Events: Including procedures, antibiotic start and stop dates in addition to other pertinent events   12/7 > Presents to ED post-cardiac arrest. Overdose  12/8 > progressive hypotension and acidosis. Nephrology consulted and patient started on CRRT.  12/9 > patient with clinical signs of seizures. EEG replaced. Noted status. Get keppra load and started on versed gtt.   Interim History / Subjective:  This AM off EPI gtt. On Levophed at 15 and vasopressin.   Objective   Blood pressure (!) 105/33, pulse 64, temperature (!) 96.4 F (35.8 C), resp. rate (!) 24, weight 101.4 kg, SpO2 100 %.    Vent Mode: PRVC FiO2 (%):  [40 %] 40 % Set Rate:  [30 bmp] 30 bmp Vt Set:  [530 mL] 530 mL PEEP:  [5 cmH20] 5 cmH20 Pressure Support:  [8 cmH20-10 cmH20] 8 cmH20 Plateau Pressure:  [19 cmH20-24 cmH20] 19 cmH20   Intake/Output Summary (Last 24 hours) at 11/13/2022 0743 Last data filed at 11/13/2022 0700 Gross per 24 hour  Intake 3962.19 ml  Output 450 ml  Net 3512.19 ml    Filed Weights   11/11/22 0355 11/12/22 0500 11/13/22 0500  Weight: 91.4 kg 97.8 kg 101.4 kg    Examination: General: Critically ill young female on vent  HENT: ETT, OG in place >> bile  Lungs: coarse breath sounds with mild accessory muscle use  Cardiovascular: RRR, HR 68, no mRG  Abdomen: distended, soft, active bowel sounds  Extremities: generalized edema throughout  Neuro: unresponsive. Pupils 5 mm and non-reactive, no cough, no gag, upper extremities with tremors noted with nailbed pressure  GU: foley in place   Resolved Hospital Problem list     Assessment & Plan:   Acute metabolic encephalopathy multifactorial in setting of acute drug overdose, post-cardiac arrest with concern for severe anoxic brain injury, +severe metabolic derangements  Status Epilepticus  History of polysubstance abuse -UDS + Opiates, Cocaine, Benzo -CT head with no acute  -LTM no seizures, severe encephalopathy  Plan  - Neurology Following  - EEG read pending - Continue Keppra  - Continue versed gtt >> will titrate up if needed pending EEG read  - Treatment as below   Profound vasoplegic shock -Recent BC +STREPTOCOCCUS MITIS/ORALIS  Cardiac Arrest with unclear down-time in setting of acute overdose  Elevated Troponin in setting of demand ischemia  Plan - Cardiac Monitoring  - Continue Vasopressors and wean for Systolic goal >100  - Follow Culture Data  - Continue Cefepime/Vancomycin >> Resp Quant with abundant Staph. BC pending  - Trend Troponin - ECHO pending  - Continue  stress dose steroids   Respiratory Insufficieny in setting of post cardiac arrest, encephalopathy  Plan - Continue Vent support >> can PS as tolerates, however neuro examination barrier to extubation  - Trend ABG/CXR   Severe Metabolic Acidosis, Lactic Acidosis, Hyperkalemia, AKI Hypocalcemia  Rhabdomyolysis with unknown downtime  Plan  - Nephrology following >> Changing to 2K bath today  - Continue CRRT  -  Trend BMP - Trend Ionized Cal, replace as needed   - Trend CK, Lactic Acid   Coagulopathy in setting of Liver Shock in setting of cardiac arrest with hypoglycemia  - S/P 2 FFP and 10 Vitamin K  Plan - Trend LFT - Trend DIC panel  - Continue Vitamin K  - Continue Dextrose   Best Practice (right click and "Reselect all SmartList Selections" daily)   Diet/type: NPO DVT prophylaxis: SCD GI prophylaxis: PPI Lines: Central line, Dialysis Catheter, and Arterial Line Foley:  Yes, and it is still needed Code Status:  full code Last date of multidisciplinary goals of care discussion have attempted multiple times to call family with no answer.   Labs   CBC: Recent Labs  Lab 11/06/22 2138 11/06/22 2201 11/07/22 0406 Dec 04, 2022 2256 2022-12-04 2307 12/04/2022 2345 11/11/22 0255 11/11/22 0311 11/11/22 0842 11/11/22 0859 11/11/22 1638 11/12/22 0345 11/12/22 0849 11/12/22 1315 11/12/22 1736  WBC 29.5*   < > 15.6*  --  9.0  --  17.9*  --   --  16.9*  --  15.5*  --   --   --   NEUTROABS 27.3*  --   --   --  5.3  --   --   --   --   --   --   --   --   --   --   HGB 13.3   < > 11.6*   < > 12.7   < > 12.5   < > 10.5* 10.2*  --  9.9* 9.5*  --  8.8*  HCT 40.2   < > 36.6   < > 43.3   < > 43.0   < > 31.0* 34.3*  --  28.4* 28.0*  --  26.0*  MCV 100.5*   < > 101.4*  --  113.9*  --  111.1*  --   --  108.2*  --  95.6  --   --   --   PLT 350   < > 279  --  244  --  174  --   --  187  190 175 104*  --  93*  --    < > = values in this interval not displayed.    Basic Metabolic Panel: Recent Labs  Lab 11/06/22 2328 11/07/22 0017 11/11/22 0255 11/11/22 0311 11/11/22 0859 11/11/22 1135 11/11/22 1640 11/12/22 0000 11/12/22 0155 11/12/22 0849 11/12/22 1315 11/12/22 1709 11/12/22 1736 11/12/22 2310 11/13/22 0325 11/13/22 0507  NA  --    < >  --    < > 145   < > 142   < > 139   < > 135 135 132* 133* 134* 135  K  --    < >  --    < > 4.8   < > 3.9   < > 3.6   < > 4.4 4.8 4.6 5.2* 5.8* 6.0*   CL  --    < >  --    < > 101   < > 95*   < > 91*  --  92* 95*  --  97* 98 97*  CO2  --    < >  --   --  12*   < > 14*   < > 18*  --  21* 20*  --  17* 17* 18*  GLUCOSE  --    < >  --    < > 278*   < > 250*   < > 73  --  74 99  --  112* 102* 97  BUN  --    < >  --    < > 23*   < > 24*   < > 21*  --  21* 21*  --  20 20 19   CREATININE  --    < >  --    < > 4.36*   < > 4.32*   < > 3.74*  --  3.50* 3.15*  --  2.91* 2.80* 2.69*  CALCIUM  --    < >  --   --  8.8*   < > 6.7*   < > 6.3*  --  6.9* 7.1*  --  7.3* 7.0* 7.1*  MG 2.1  --  3.0*  --  2.5*  --   --   --  2.2  --   --   --   --   --   --   --   PHOS  --    < > >30.0*  --  >30.0*  --  9.9*  --  7.6*  --   --  6.8*  --   --   --  5.9*   < > = values in this interval not displayed.   GFR: Estimated Creatinine Clearance: 36.7 mL/min (A) (by C-G formula based on SCr of 2.69 mg/dL (H)). Recent Labs  Lab 11/06/22 2328 11/07/22 0017 11/09/2022 2307 11/11/22 0123 11/11/22 0255 11/11/22 0859 11/11/22 1135 11/11/22 1646 11/11/22 2310 11/12/22 0345 11/12/22 1315  PROCALCITON 1.64  --   --   --   --   --   --   --   --   --   --   WBC  --    < > 9.0  --  17.9* 16.9*  --   --   --  15.5*  --   LATICACIDVEN  --    < > >9.0*   < >  --  >9.0* >9.0* >9.0* >9.0*  --  >9.0*   < > = values in this interval not displayed.    Liver Function Tests: Recent Labs  Lab 11/07/22 0406 11/04/2022 2307 11/11/22 0859 11/11/22 1640 11/12/22 0155 11/12/22 1709 11/13/22 0325 11/13/22 0507  AST 24 4,600* >10,000*  --  >10,000*  --  >10,000*  --   ALT 16 2,320* 4,666*  --  4,744*  --  3,757*  --   ALKPHOS 38 56 69  --  111  --  123  --   BILITOT 0.4 0.7 1.7*  --  4.1*  --  6.5*  --   PROT 6.3* 5.7* 4.0*  --  5.0*  --  4.8*  --   ALBUMIN 3.5 3.1* 2.2* 2.6* 2.8*  2.8* 2.7* 3.0* 3.2*   No results for input(s): "LIPASE", "AMYLASE" in the last 168 hours. Recent Labs  Lab 11/07/22 0100  AMMONIA 23    ABG    Component Value Date/Time   PHART 7.320 (L)  11/12/2022 1736   PCO2ART 41.5 11/12/2022 1736   PO2ART 127 (H) 11/12/2022  1736   HCO3 21.5 11/12/2022 1736   TCO2 23 11/12/2022 1736   ACIDBASEDEF 4.0 (H) 11/12/2022 1736   O2SAT 99 11/12/2022 1736     Coagulation Profile: Recent Labs  Lab 11/11/22 0859 11/11/22 1638 11/12/22 1315  INR 5.2* 4.7* 4.7*    Cardiac Enzymes: Recent Labs  Lab 11/12/22 0000 11/12/22 0155 11/12/22 1315 11/12/22 2310 11/13/22 0325  CKTOTAL 11,282* 13,194* 18,962* 22,392* 33,719*    HbA1C: Hgb A1c MFr Bld  Date/Time Value Ref Range Status  11/07/2022 01:00 AM 5.3 4.8 - 5.6 % Final    Comment:    (NOTE)         Prediabetes: 5.7 - 6.4         Diabetes: >6.4         Glycemic control for adults with diabetes: <7.0   01/13/2021 06:30 AM 5.5 4.8 - 5.6 % Final    Comment:    (NOTE) Pre diabetes:          5.7%-6.4%  Diabetes:              >6.4%  Glycemic control for   <7.0% adults with diabetes     CBG: Recent Labs  Lab 11/12/22 2111 11/12/22 2308 11/13/22 0204 11/13/22 0322 11/13/22 0506  GLUCAP 131* 119* 102* 103* 100*    Review of Systems:   Critically ill, unable to obtain complete ROS.   CRITICAL CARE Performed by: Tobey Grim   Total critical care time: 45 minutes  Critical care time was exclusive of separately billable procedures and treating other patients.  Critical care was necessary to treat or prevent imminent or life-threatening deterioration.  Critical care was time spent personally by me on the following activities: development of treatment plan with patient and/or surrogate as well as nursing, discussions with consultants, evaluation of patient's response to treatment, examination of patient, obtaining history from patient or surrogate, ordering and performing treatments and interventions, ordering and review of laboratory studies, ordering and review of radiographic studies, pulse oximetry and re-evaluation of patient's condition.

## 2022-11-13 NOTE — Progress Notes (Signed)
Critical labs Potassium 6.9 Pt nurse aware  Elink aware .  Shak RN

## 2022-11-13 NOTE — Significant Event (Signed)
Nephrology contacted regarding progressive hyperkalemia. Orders changed to all 2K baths.

## 2022-11-13 NOTE — Progress Notes (Signed)
EEG maint complete.  ?

## 2022-11-13 NOTE — Significant Event (Signed)
Connected with Mother. States patients son lives with her. Reviewed critical situation and hospital stay. Understands. Is going to figure out how to get her and patient son to hospital.

## 2022-11-13 NOTE — Progress Notes (Signed)
Neurology Progress Note  Brief HPI: Patient with history of polysubstance abuse and depression initially presented unresponsive and pulseless with concern for overdose.  She was found pulseless and apneic by family at home, received Narcan and CPR with EMS.  ROSC was achieved, but pulse was lost 3 times and then regained.  On exam, patient is unresponsive despite no sedation.  Urine toxicology was positive for cocaine, opiates and benzos.  Subjective: Patient remains in the ICU and continues on CRRT.  She is off epinephrine but continues on norepi and vaso.  She developed abnormal facial twitching and hand shaking yesterday and was noted to be in status epilepticus.  She was given lorazepam, loaded with Keppra and placed on Keppra and a Versed gtt with resultant cessation of seizure activity.  LTM EEG continues.  Exam: Vitals:   11/13/22 0830 11/13/22 0900  BP: (!) 114/27   Pulse: 69 71  Resp: 18 17  Temp:  (!) 96.3 F (35.7 C)  SpO2: 100%    Gen: In bed, NAD Resp: Respirations synchronous with ventilator  Neuro (on Versed gtt at 5mg /hr): Pupils 4 mm, and midposition and fixed, weak oculocephalic reflex, no corneal reflex, no cough, patient continues to trigger breaths on the ventilator.  No response to noxious stimuli in all four extremities, no abnormal facial movement or rhythmic arm twitching noted.   Pertinent Labs:    Latest Ref Rng & Units 11/13/2022    8:04 AM 11/12/2022   11:10 PM 11/12/2022    5:36 PM  CBC  WBC 4.0 - 10.5 K/uL 14.0     Hemoglobin 12.0 - 15.0 g/dL 8.4   8.8   Hematocrit 36.0 - 46.0 % 25.7   26.0   Platelets 150 - 400 K/uL 49  57         Latest Ref Rng & Units 11/13/2022    5:07 AM 11/13/2022    3:25 AM 11/12/2022   11:10 PM  BMP  Glucose 70 - 99 mg/dL 97  14/08/2022  408   BUN 6 - 20 mg/dL 19  20  20    Creatinine 0.44 - 1.00 mg/dL 144   8.18   Sodium 135 - 145 mmol/L 135  134  133   Potassium 3.5 - 5.1 mmol/L 6.0  5.8  5.2   Chloride 98 - 111 mmol/L  97  98  97   CO2 22 - 32 mmol/L 18  17  17    Calcium 8.9 - 10.3 mg/dL 7.1  7.0  7.3    ABG    Component Value Date/Time   PHART 7.320 (L) 11/12/2022 1736   PCO2ART 41.5 11/12/2022 1736   PO2ART 127 (H) 11/12/2022 1736   HCO3 21.5 11/12/2022 1736   TCO2 23 11/12/2022 1736   ACIDBASEDEF 4.0 (H) 11/12/2022 1736   O2SAT 99 11/12/2022 1736     Imaging Reviewed: CT head: Reading states no acute abnormality, however ventricles look small and crowded.  Assessment: 36 year old patient with history of polysubstance abuse and depression was admitted after being found pulseless and apneic at home.  She was given Narcan by EMS, and CPR was performed with ROSC achieved however pulse was lost subsequently 3 times.  Pupils are midline fixed and nonreactive, and patient has no response to noxious stimuli.  Neurology consulted for prognostication.  High suspicion for significant anoxic brain injury.  Patient is currently in the ICU and is rather unstable on high doses of pressors and will be starting CRRT today.  Would like to repeat CT scan of head given midline, fixed pupils and crowded appearance of prior head CT, however patient is too unstable for this right now.  Will need to correct metabolic abnormalities before being able to give prognosis based on neuro exam.  Patient will need MRI between day 3 and 5, when she is stable enough for scan.  Yesterday, she developed abnormal facial and arm movements and was found to be in status epilepticus.  She was given lorazepam, loaded with Keppra and placed on Keppra and a Versed gtt with cessation of seizure activity.  Impression: High suspicion for significant anoxic brain injury and postcardiac arrest patient, status epilepticus terminated with medications.  Recommendations: 1) MRI brain between day 3 and 5 2) avoid hypotension, hyperthermia and hyponatremia 3) neurology will continue to follow for prognostication 4) Continue LTM EEG 5) May wean versed when  stable to do so. Increase if electrographic seizure activity recurs on EEG 6) Continue Keppra 1000 mg q12 hours (CRRT dosing)  Cortney E Ernestina Columbia , MSN, AGACNP-BC [facility] See Amion for schedule and pager information 11/13/2022 10:13 AM   Attending Neurohospitalist Addendum Patient seen and examined with APP/Resident. Agree with the history and physical as documented above. Agree with the plan as documented, which I helped formulate. I have edited the note above to reflect my full findings and recommendations. I have independently reviewed the chart, obtained history, review of systems and examined the patient.I have personally reviewed pertinent head/neck/spine imaging (CT/MRI). Please feel free to call with any questions.   This patient is critically ill and at significant risk of neurological worsening, death and care requires constant monitoring of vital signs, hemodynamics,respiratory and cardiac monitoring, neurological assessment, discussion with family, other specialists and medical decision making of high complexity. I spent 45 minutes of neurocritical care time  in the care of  this patient. This was time spent independent of any time provided by nurse practitioner or PA.   Bing Neighbors, MD [facility] [phone]   If 7pm- 7am, please page neurology on call as listed in AMION.

## 2022-11-13 NOTE — Procedures (Signed)
EEG Procedure CPT/Type of Study: 95718; 2-12hr EEG with video Referring Provider: Ernestina Columbia Primary Neurological Diagnosis: cardiac arrest  History: This is a 36 yr old patient, undergoing an EEG to evaluate for cardiac arrest. Clinical State: comatose  Technical Description:  The EEG was performed using standard setting per the guidelines of American Clinical Neurophysiology Society (ACNS).  A minimum of 21 electrodes were placed on scalp according to the International 10-20 or/and 10-10 Systems. Supplemental electrodes were placed as needed. Single EKG electrode was also used to detect cardiac arrhythmia. Patient's behavior was continuously recorded on video simultaneously with EEG. A minimum of 16 channels were used for data display. Each epoch of study was reviewed manually daily and as needed using standard referential and bipolar montages. Computerized quantitative EEG analysis (such as compressed spectral array analysis, trending, automated spike & seizure detection) were used as indicated.   Day 1: from 1130 11/12/22 to 0730 11/13/22  EEG Description: Overall Amplitude: Low Predominant Frequency: Delta   Superimposed Frequencies: none The background was symmetric  Background Abnormalities: Diffuse slowing Rhythmic or periodic pattern: No Epileptiform activity: no Electrographic seizures: Yes, described as 3 to 3.5 Hz generalized discharges Events: no   Breach rhythm: no  Reactivity: Absent  Stimulation procedures:  Hyperventilation: not done Photic stimulation: not done  Sleep Background: none  EKG:no significant arrhythmia  Impression: This is a markedly abnormal continuous video EEG due to a presence of a prolong seizure 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 After administration of medications, background changed to moderate diffuse slowing.    Windell Norfolk, MD

## 2022-11-14 DIAGNOSIS — R6521 Severe sepsis with septic shock: Secondary | ICD-10-CM

## 2022-11-14 DIAGNOSIS — Z9911 Dependence on respirator [ventilator] status: Secondary | ICD-10-CM | POA: Diagnosis not present

## 2022-11-14 DIAGNOSIS — J9601 Acute respiratory failure with hypoxia: Secondary | ICD-10-CM | POA: Diagnosis not present

## 2022-11-14 DIAGNOSIS — I469 Cardiac arrest, cause unspecified: Secondary | ICD-10-CM | POA: Diagnosis not present

## 2022-11-14 DIAGNOSIS — J15211 Pneumonia due to Methicillin susceptible Staphylococcus aureus: Secondary | ICD-10-CM

## 2022-11-14 DIAGNOSIS — A419 Sepsis, unspecified organism: Secondary | ICD-10-CM | POA: Diagnosis not present

## 2022-11-14 LAB — BASIC METABOLIC PANEL
Anion gap: 17 — ABNORMAL HIGH (ref 5–15)
Anion gap: 18 — ABNORMAL HIGH (ref 5–15)
BUN: 17 mg/dL (ref 6–20)
BUN: 18 mg/dL (ref 6–20)
CO2: 17 mmol/L — ABNORMAL LOW (ref 22–32)
CO2: 17 mmol/L — ABNORMAL LOW (ref 22–32)
Calcium: 7.2 mg/dL — ABNORMAL LOW (ref 8.9–10.3)
Calcium: 7.2 mg/dL — ABNORMAL LOW (ref 8.9–10.3)
Chloride: 99 mmol/L (ref 98–111)
Chloride: 98 mmol/L (ref 98–111)
Creatinine, Ser: 2.41 mg/dL — ABNORMAL HIGH (ref 0.44–1.00)
Creatinine, Ser: 2.48 mg/dL — ABNORMAL HIGH (ref 0.44–1.00)
GFR, Estimated: 26 mL/min — ABNORMAL LOW (ref >60)
GFR, Estimated: 25 mL/min — ABNORMAL LOW (ref >60)
Glucose, Bld: 103 mg/dL — ABNORMAL HIGH (ref 70–99)
Glucose, Bld: 98 mg/dL (ref 70–99)
Potassium: 5.8 mmol/L — ABNORMAL HIGH (ref 3.5–5.1)
Potassium: 5.4 mmol/L — ABNORMAL HIGH (ref 3.5–5.1)
Sodium: 133 mmol/L — ABNORMAL LOW (ref 135–145)
Sodium: 133 mmol/L — ABNORMAL LOW (ref 135–145)

## 2022-11-14 LAB — POCT I-STAT 7, (LYTES, BLD GAS, ICA,H+H)
Acid-base deficit: 10 mmol/L — ABNORMAL HIGH (ref 0.0–2.0)
Bicarbonate: 16.7 mmol/L — ABNORMAL LOW (ref 20.0–28.0)
Calcium, Ion: 0.97 mmol/L — ABNORMAL LOW (ref 1.15–1.40)
HCT: 24 % — ABNORMAL LOW (ref 36.0–46.0)
Hemoglobin: 8.2 g/dL — ABNORMAL LOW (ref 12.0–15.0)
O2 Saturation: 88 %
Patient temperature: 98.1
Potassium: 5 mmol/L (ref 3.5–5.1)
Sodium: 130 mmol/L — ABNORMAL LOW (ref 135–145)
TCO2: 18 mmol/L — ABNORMAL LOW (ref 22–32)
pCO2 arterial: 37.4 mmHg (ref 32–48)
pH, Arterial: 7.257 — ABNORMAL LOW (ref 7.35–7.45)
pO2, Arterial: 61 mmHg — ABNORMAL LOW (ref 83–108)

## 2022-11-14 LAB — DIC (DISSEMINATED INTRAVASCULAR COAGULATION)PANEL
D-Dimer, Quant: >20 ug/mL-FEU — ABNORMAL HIGH (ref 0.00–0.50)
Fibrinogen: 127 mg/dL — ABNORMAL LOW (ref 210–475)
INR: 5 (ref 0.8–1.2)
Platelets: 28 10*3/uL — CL (ref 150–400)
Prothrombin Time: 45.9 seconds — ABNORMAL HIGH (ref 11.4–15.2)
Smear Review: NONE SEEN
aPTT: 42 seconds — ABNORMAL HIGH (ref 24–36)

## 2022-11-14 LAB — CBC
HCT: 26.8 % — ABNORMAL LOW (ref 36.0–46.0)
Hemoglobin: 8.3 g/dL — ABNORMAL LOW (ref 12.0–15.0)
MCH: 32.3 pg (ref 26.0–34.0)
MCHC: 31 g/dL (ref 30.0–36.0)
MCV: 104.3 fL — ABNORMAL HIGH (ref 80.0–100.0)
Platelets: 22 10*3/uL — CL (ref 150–400)
RBC: 2.57 MIL/uL — ABNORMAL LOW (ref 3.87–5.11)
RDW: 12.9 % (ref 11.5–15.5)
WBC: 19.3 10*3/uL — ABNORMAL HIGH (ref 4.0–10.5)
nRBC: 18.4 % — ABNORMAL HIGH (ref 0.0–0.2)

## 2022-11-14 LAB — RENAL FUNCTION PANEL
Albumin: 2.9 g/dL — ABNORMAL LOW (ref 3.5–5.0)
Anion gap: 18 — ABNORMAL HIGH (ref 5–15)
BUN: 18 mg/dL (ref 6–20)
CO2: 16 mmol/L — ABNORMAL LOW (ref 22–32)
Calcium: 7 mg/dL — ABNORMAL LOW (ref 8.9–10.3)
Chloride: 99 mmol/L (ref 98–111)
Creatinine, Ser: 2.39 mg/dL — ABNORMAL HIGH (ref 0.44–1.00)
GFR, Estimated: 26 mL/min — ABNORMAL LOW (ref >60)
Glucose, Bld: 93 mg/dL (ref 70–99)
Phosphorus: 5.4 mg/dL — ABNORMAL HIGH (ref 2.5–4.6)
Potassium: 5.3 mmol/L — ABNORMAL HIGH (ref 3.5–5.1)
Sodium: 133 mmol/L — ABNORMAL LOW (ref 135–145)

## 2022-11-14 LAB — HEPATIC FUNCTION PANEL
ALT: 2918 U/L — ABNORMAL HIGH (ref 0–44)
AST: 7325 U/L — ABNORMAL HIGH (ref 15–41)
Albumin: 3 g/dL — ABNORMAL LOW (ref 3.5–5.0)
Alkaline Phosphatase: 159 U/L — ABNORMAL HIGH (ref 38–126)
Bilirubin, Direct: 5.2 mg/dL — ABNORMAL HIGH (ref 0.0–0.2)
Indirect Bilirubin: 2.9 mg/dL — ABNORMAL HIGH (ref 0.3–0.9)
Total Bilirubin: 8.1 mg/dL — ABNORMAL HIGH (ref 0.3–1.2)
Total Protein: 4.7 g/dL — ABNORMAL LOW (ref 6.5–8.1)

## 2022-11-14 LAB — CK
Total CK: 46313 U/L — ABNORMAL HIGH (ref 38–234)
Total CK: 48122 U/L — ABNORMAL HIGH (ref 38–234)
Total CK: >50000 U/L — ABNORMAL HIGH (ref 38–234)

## 2022-11-14 LAB — GLUCOSE, CAPILLARY
Glucose-Capillary: 108 mg/dL — ABNORMAL HIGH (ref 70–99)
Glucose-Capillary: 100 mg/dL — ABNORMAL HIGH (ref 70–99)
Glucose-Capillary: 106 mg/dL — ABNORMAL HIGH (ref 70–99)
Glucose-Capillary: 102 mg/dL — ABNORMAL HIGH (ref 70–99)
Glucose-Capillary: 125 mg/dL — ABNORMAL HIGH (ref 70–99)

## 2022-11-14 LAB — MAGNESIUM: Magnesium: 2.7 mg/dL — ABNORMAL HIGH (ref 1.7–2.4)

## 2022-11-14 MED ORDER — MIDAZOLAM BOLUS VIA INFUSION (WITHDRAWAL LIFE SUSTAINING TX): 2.0000 mg | INTRAVENOUS | Status: DC | PRN

## 2022-11-14 MED ORDER — RENA-VITE PO TABS: 1.0000 | ORAL_TABLET | Freq: Every day | ORAL | Status: DC

## 2022-11-14 MED ORDER — GLYCOPYRROLATE 0.2 MG/ML IJ SOLN: 0.2000 mg | INTRAMUSCULAR | Status: DC | PRN

## 2022-11-14 MED ORDER — PROSOURCE TF20 ENFIT COMPATIBL EN LIQD: 60.0000 mL | Freq: Every day | ENTERAL | Status: DC

## 2022-11-14 MED ORDER — VITAMIN K1 10 MG/ML IJ SOLN
5.0000 mg | Freq: Once | INTRAVENOUS | Status: AC
  Administered 2022-11-14: 5 mg via INTRAVENOUS
  Filled 2022-11-14: qty 0.5

## 2022-11-14 MED ORDER — GLYCOPYRROLATE 1 MG PO TABS: 1.0000 mg | ORAL_TABLET | ORAL | Status: DC | PRN

## 2022-11-14 MED ORDER — VITAL HIGH PROTEIN PO LIQD: 1000.0000 mL | ORAL | Status: DC

## 2022-11-14 MED ORDER — MIDAZOLAM HCL 2 MG/2ML IJ SOLN: 1.0000 mg | INTRAMUSCULAR | Status: DC | PRN

## 2022-11-14 MED ORDER — POLYVINYL ALCOHOL 1.4 % OP SOLN: 1.0000 [drp] | Freq: Four times a day (QID) | OPHTHALMIC | Status: DC | PRN

## 2022-11-14 MED ORDER — CEFAZOLIN SODIUM-DEXTROSE 2-4 GM/100ML-% IV SOLN
2.0000 g | Freq: Two times a day (BID) | INTRAVENOUS | Status: DC
  Administered 2022-11-14: 2 g via INTRAVENOUS
  Filled 2022-11-14 (×2): qty 100

## 2022-11-14 MED ORDER — ACETAMINOPHEN 650 MG RE SUPP: 650.0000 mg | Freq: Four times a day (QID) | RECTAL | Status: DC | PRN

## 2022-11-14 MED ORDER — MORPHINE BOLUS VIA INFUSION: 5.0000 mg | INTRAVENOUS | Status: DC | PRN

## 2022-11-14 MED ORDER — CALCIUM GLUCONATE-NACL 2-0.675 GM/100ML-% IV SOLN
2.0000 g | Freq: Once | INTRAVENOUS | Status: AC
  Administered 2022-11-14: 2000 mg via INTRAVENOUS
  Filled 2022-11-14: qty 100

## 2022-11-14 MED ORDER — ACETAMINOPHEN 325 MG PO TABS: 650.0000 mg | ORAL_TABLET | Freq: Four times a day (QID) | ORAL | Status: DC | PRN

## 2022-11-14 MED ORDER — MORPHINE 100MG IN NS 100ML (1MG/ML) PREMIX INFUSION
0.0000 mg/h | INTRAVENOUS | Status: DC
  Administered 2022-11-14: 5 mg/h via INTRAVENOUS
  Filled 2022-11-14: qty 100

## 2022-11-14 MED ORDER — MIDAZOLAM-SODIUM CHLORIDE 100-0.9 MG/100ML-% IV SOLN: 0.0000 mg/h | INTRAVENOUS | Status: DC

## 2022-11-14 MED ORDER — SODIUM CHLORIDE 0.9 % IV SOLN: INTRAVENOUS | Status: DC

## 2022-11-14 MED ORDER — VITAL 1.5 CAL PO LIQD
1000.0000 mL | ORAL | Status: DC
  Administered 2022-11-14: 1000 mL
  Filled 2022-11-14: qty 1000

## 2022-11-15 LAB — PATHOLOGIST SMEAR REVIEW

## 2022-11-15 LAB — NEURON-SPECIFIC ENOLASE(NSE), BLOOD: Neuron-specific Enolase, Serum: 84.7 ng/mL — ABNORMAL HIGH (ref 0.0–17.6)

## 2022-11-16 LAB — CULTURE, BLOOD (ROUTINE X 2)
Culture: NO GROWTH
Culture: NO GROWTH
Special Requests: ADEQUATE
Special Requests: ADEQUATE

## 2022-12-05 NOTE — Progress Notes (Signed)
Nutrition Brief Note ° °Chart reviewed. °Pt now transitioning to comfort care.  °No further nutrition interventions planned at this time.  °Please re-consult as needed. ° ° °Kate Ilina Xu, MS, RD, LDN °Inpatient Clinical Dietitian °Please see AMiON for contact information. ° ° °

## 2022-12-05 NOTE — Death Summary Note (Signed)
DEATH SUMMARY   Patient Details  Name: Erika Taylor MRN: [id number] DOB: 02-07-1986  Admission/Discharge Information   Admit Date:  12/04/2022  Date of Death: Date of Death: Dec 08, 2022  Time of Death: Time of Death: 1501/04/02  Length of Stay: 3  Referring Physician: [facility]   Reason(s) for Hospitalization  Cardiac arrest  Diagnoses  Preliminary cause of death:  Secondary Diagnoses (including complications and co-morbidities):  Principal Problem:   Cardiac arrest (HCC) Acute metabolic encephalopathy Multiorgan failure Severe distributive shock, likely cardiogenic shock Strep mitis bacteremia Status epilepticus Polysubstance abuse 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 Presumed drug overdose History of polysubstance abuse Profound vasoplegic shock Hyperkalemia, related to acidosis, mixed metabolic and respiratory acidosis Hypoglycemia Pressure injury- leg, sacrum Acute respiratory failure with hypoxia Lactic acidosis Hyperkalemia Hyponatremia Hyperkalemia Hypocalcemia Rhabdomyolysis Shock liver Coaguloapthy hypoglycemia  Brief Hospital Course (including significant findings, care, treatment, and services provided and events leading to death)  Erika Taylor is a 37 y.o. year old female with PMH depression, arrived via EMS, known history of polysubstance use.  Recently admitted 12/3-12/4 with acute overdose, narcan responsive.    Patient was last seen by family at least around 45 minutes prior to being found unresponsive pulseless and apneic.  She was given Narcan by the fire department and CPR was initiated at 2144-04-02 by EMS.  She obtained ROSC but lost pulse x 2.  She was brought to to the emergency department she had a left tibial IO placed with epinephrine infusing.   12/8 with progressive hypotension and  acidosis. Nephrology consulted and patient started on CRRT.   She remained in shock requiring vasopressors, antibiotics, CRRT for metabolic clearance and for management of metabolic acidosis. She failed to improve during her hospitalization and due to her poor neurologic prognosis her family elected to withdraw aggressive care measures to focus on her comfort.   Pertinent Labs and Studies  Significant Diagnostic Studies ECHOCARDIOGRAM LIMITED  Result Date: 11/13/2022    ECHOCARDIOGRAM LIMITED REPORT   Patient Name:   Erika Taylor Date of Exam: 11/12/2022 Medical Rec #:  [id number]       Height:       69.0 in Accession #:    [id number]      Weight:       215.6 lb Date of Birth:  07/02/1986       BSA:          2.133 m Patient Age:    36 years        BP:           105/33 mmHg Patient Gender: F               HR:           73 bpm. Exam Location:  Inpatient Procedure: Limited Echo and Color Doppler Indications:    CHF  History:        Patient has prior history of Echocardiogram examinations, most                 recent 11/07/2022. Arrythmias:Ventricular Fibrillation.                 Polysubstance abuse.  Sonographer:    Ross Ludwig RDCS (AE) Referring Phys: [id number] Tobey Grim  Sonographer Comments: Echo performed with patient supine and on artificial respirator. IMPRESSIONS  1. Left  ventricular ejection fraction, by estimation, is 60 to 65%. The left ventricle has normal function. The left ventricle has no regional wall motion abnormalities.  2. Right ventricular systolic function is normal. The right ventricular size is normal. Tricuspid regurgitation signal is inadequate for assessing PA pressure.  3. The mitral valve is normal in structure. No evidence of mitral valve regurgitation. No evidence of mitral stenosis.  4. The aortic valve is normal in structure. Aortic valve regurgitation is not visualized. No aortic stenosis is present.  5. The inferior vena cava is normal in size with <50% respiratory  variability, suggesting right atrial pressure of 8 mmHg. FINDINGS  Left Ventricle: Left ventricular ejection fraction, by estimation, is 60 to 65%. The left ventricle has normal function. The left ventricle has no regional wall motion abnormalities. The left ventricular internal cavity size was normal in size. There is  no left ventricular hypertrophy. Right Ventricle: The right ventricular size is normal. No increase in right ventricular wall thickness. Right ventricular systolic function is normal. Tricuspid regurgitation signal is inadequate for assessing PA pressure. Left Atrium: Left atrial size was normal in size. Right Atrium: Right atrial size was normal in size. Pericardium: There is no evidence of pericardial effusion. Mitral Valve: The mitral valve is normal in structure. No evidence of mitral valve stenosis. Tricuspid Valve: The tricuspid valve is normal in structure. Tricuspid valve regurgitation is trivial. No evidence of tricuspid stenosis. Aortic Valve: The aortic valve is normal in structure. Aortic valve regurgitation is not visualized. No aortic stenosis is present. Pulmonic Valve: The pulmonic valve was normal in structure. Pulmonic valve regurgitation is not visualized. No evidence of pulmonic stenosis. Aorta: The aortic root is normal in size and structure. Venous: The inferior vena cava is normal in size with less than 50% respiratory variability, suggesting right atrial pressure of 8 mmHg. IAS/Shunts: No atrial level shunt detected by color flow Doppler. Additional Comments: Color Doppler performed.  LEFT VENTRICLE PLAX 2D LVIDd:         4.20 cm LVIDs:         2.70 cm LV PW:         1.10 cm LV IVS:        0.90 cm LVOT diam:     2.20 cm LVOT Area:     3.80 cm  LEFT ATRIUM         Index LA diam:    3.10 cm 1.45 cm/m   AORTA Ao Root diam: 2.40 cm Ao Asc diam:  2.50 cm  SHUNTS Systemic Diam: 2.20 cm Armanda Magic MD Electronically signed by Armanda Magic MD Signature Date/Time:  11/13/2022/11:37:32 AM    Final    Overnight EEG with video  Result Date: 11/13/2022 Windell Norfolk, MD     11/13/2022  8:46 AM EEG Procedure CPT/Type of Study: 16109; 2-12hr EEG with video Referring Provider: Ernestina Columbia Primary Neurological Diagnosis: cardiac arrest History: This is a 37 yr old patient, undergoing an EEG to evaluate for cardiac arrest. Clinical State: comatose Technical Description: The EEG was performed using standard setting per the guidelines of American Clinical Neurophysiology Society (ACNS). A minimum of 21 electrodes were placed on scalp according to the International 10-20 or/and 10-10 Systems. Supplemental electrodes were placed as needed. Single EKG electrode was also used to detect cardiac arrhythmia. Patient's behavior was continuously recorded on video simultaneously with EEG. A minimum of 16 channels were used for data display. Each epoch of study was reviewed manually daily and as needed  using standard referential and bipolar montages. Computerized quantitative EEG analysis (such as compressed spectral array analysis, trending, automated spike & seizure detection) were used as indicated. Day 1: from 1130 11/12/22 to 0730 11/13/22 EEG Description: Overall Amplitude: Low Predominant Frequency: Delta  Superimposed Frequencies: none The background was symmetric Background Abnormalities: Diffuse slowing Rhythmic or periodic pattern: No Epileptiform activity: no Electrographic seizures: Yes, described as 3 to 3.5 Hz generalized discharges Events: no Breach rhythm: no Reactivity: Absent Stimulation procedures: Hyperventilation: not done Photic stimulation: not done Sleep Background: none EKG:no significant arrhythmia Impression: This is a markedly abnormal continuous video EEG due to a presence of a prolong seizure 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 After administration of medications, background changed to moderate diffuse slowing. Windell Norfolk, MD   DG  Chest 1 View  Result Date: 11/11/2022 CLINICAL DATA:  Encounter for central line placement EXAM: CHEST  1 VIEW COMPARISON:  11/11/2022 FINDINGS: ET tube tip is above the carina. There is a enteric tube with tip below the level of the GE junction. Interval placement of right IJ catheter with tip in the SVC. No pneumothorax identified. Stable cardiomediastinal contours. The lungs are clear. No pleural fluid or airspace disease. IMPRESSION: Interval placement of right IJ catheter with tip in the SVC. No pneumothorax. Electronically Signed   By: Signa Kell M.D.   On: 11/11/2022 11:20   Overnight EEG with video  Result Date: 11/11/2022 Lind Guest, MD     11/11/2022  8:18 AM EEG Procedure CPT/Type of Study: 95718; 2-12hr EEG with video Referring Provider: Icard Primary Neurological Diagnosis: cardiac arrest History: This is a 37 yr old patient, undergoing an EEG to evaluate for cardiac arrest. Clinical State: comatose Technical Description: The EEG was performed using standard setting per the guidelines of American Clinical Neurophysiology Society (ACNS). A minimum of 21 electrodes were placed on scalp according to the International 10-20 or/and 10-10 Systems. Supplemental electrodes were placed as needed. Single EKG electrode was also used to detect cardiac arrhythmia. Patient's behavior was continuously recorded on video simultaneously with EEG. A minimum of 16 channels were used for data display. Each epoch of study was reviewed manually daily and as needed using standard referential and bipolar montages. Computerized quantitative EEG analysis (such as compressed spectral array analysis, trending, automated spike & seizure detection) were used as indicated. Day 1: from 0439 11/11/22 to 0730 11/11/22 EEG Description: Overall Amplitude: suppressed Predominant Frequency: The background activity showed complete background suppression with some respirator artifact only. Superimposed Frequencies: none The background  was symmetric Background Abnormalities: Complete background suppression Rhythmic or periodic pattern: No Epileptiform activity: no Electrographic seizures: no Events: no Breach rhythm: no Reactivity: Absent Stimulation procedures: Hyperventilation: not done Photic stimulation: not done Sleep Background: none EKG:no significant arrhythmia Impression: This was a markedly abnormal continuous video EEG due to complete background suppression and absent reactivity, indicative of a severe encephalopathy pattern. No seizures or epileptiform discharges were seen.   DG Chest Port 1 View  Result Date: 11/11/2022 CLINICAL DATA:  Respiratory failure. EXAM: PORTABLE CHEST 1 VIEW COMPARISON:  12/08/22 FINDINGS: 0448 hours. Endotracheal tube tip is 3.3 cm above the base of the carina. The NG tube passes into the stomach although the distal tip position is not included on the film. The lungs are clear without focal pneumonia, edema, pneumothorax or pleural effusion. The cardio pericardial silhouette is enlarged. Telemetry leads overlie the chest. IMPRESSION: Endotracheal tube tip is 3.3 cm above the base of the carina.  Interval decompression of gas distended esophagus and stomach with NG tube now in place. Electronically Signed   By: Kennith Center M.D.   On: 11/11/2022 05:36   CT Head Wo Contrast  Result Date: 11/11/2022 CLINICAL DATA:  Arrest, CPR in progress. EXAM: CT HEAD WITHOUT CONTRAST CT CERVICAL SPINE WITHOUT CONTRAST TECHNIQUE: Multidetector CT imaging of the head and cervical spine was performed following the standard protocol without intravenous contrast. Multiplanar CT image reconstructions of the cervical spine were also generated. RADIATION DOSE REDUCTION: This exam was performed according to the departmental dose-optimization program which includes automated exposure control, adjustment of the mA and/or kV according to patient size and/or use of iterative reconstruction technique. COMPARISON:  12/09/2021.  FINDINGS: CT HEAD FINDINGS Brain: No acute intracranial hemorrhage, midline shift or mass effect. No extra-axial fluid collection. Gray-white matter differentiation is within normal limits. No hydrocephalus. Vascular: No hyperdense vessel or unexpected calcification. Skull: Normal. Negative for fracture or focal lesion. Sinuses/Orbits: Diffuse mucosal thickening is noted in the paranasal sinuses. The orbits are within normal limits. Other: Mild subcutaneous fat stranding is noted over the frontal bone on the left anteriorly,, possible small scalp contusion. CT CERVICAL SPINE FINDINGS Alignment: Normal. Skull base and vertebrae: No acute fracture. No primary bone lesion or focal pathologic process. Soft tissues and spinal canal: No prevertebral fluid or swelling. No visible canal hematoma. Disc levels:  Mild endplate osteophyte formation at C5. Upper chest: No acute abnormality. Other: Endotracheal and enteric tubes are noted. IMPRESSION: 1. No acute intracranial process. 2. Mild degenerative changes in the cervical spine without evidence of acute fracture. Electronically Signed   By: Thornell Sartorius M.D.   On: 11/11/2022 03:53   CT Cervical Spine Wo Contrast  Result Date: 11/11/2022 CLINICAL DATA:  Arrest, CPR in progress. EXAM: CT HEAD WITHOUT CONTRAST CT CERVICAL SPINE WITHOUT CONTRAST TECHNIQUE: Multidetector CT imaging of the head and cervical spine was performed following the standard protocol without intravenous contrast. Multiplanar CT image reconstructions of the cervical spine were also generated. RADIATION DOSE REDUCTION: This exam was performed according to the departmental dose-optimization program which includes automated exposure control, adjustment of the mA and/or kV according to patient size and/or use of iterative reconstruction technique. COMPARISON:  12/09/2021. FINDINGS: CT HEAD FINDINGS Brain: No acute intracranial hemorrhage, midline shift or mass effect. No extra-axial fluid collection.  Gray-white matter differentiation is within normal limits. No hydrocephalus. Vascular: No hyperdense vessel or unexpected calcification. Skull: Normal. Negative for fracture or focal lesion. Sinuses/Orbits: Diffuse mucosal thickening is noted in the paranasal sinuses. The orbits are within normal limits. Other: Mild subcutaneous fat stranding is noted over the frontal bone on the left anteriorly,, possible small scalp contusion. CT CERVICAL SPINE FINDINGS Alignment: Normal. Skull base and vertebrae: No acute fracture. No primary bone lesion or focal pathologic process. Soft tissues and spinal canal: No prevertebral fluid or swelling. No visible canal hematoma. Disc levels:  Mild endplate osteophyte formation at C5. Upper chest: No acute abnormality. Other: Endotracheal and enteric tubes are noted. IMPRESSION: 1. No acute intracranial process. 2. Mild degenerative changes in the cervical spine without evidence of acute fracture. Electronically Signed   By: Thornell Sartorius M.D.   On: 11/11/2022 03:53   EEG adult  Result Date: 11/11/2022 Larkin Ina, MD     11/11/2022  3:26 AM [facility] Stat EEG Report Video Performed: Performed Demographics: Patient Name:   Yanessa, Hocevar Date of Birth:   [birth year] Identification Number:   MRN -  [id number] Study Times: Study Start Time:   11/11/2022 02:31:00 Study End Time:   11/11/2022 02:58:00 Duration:   27 minutes Indication(s): Encephalopathy Technical Summary: This EEG was performed utilizing standard International 10-20 System of electrode placement. One channel electrocardiogram was monitored. Data were obtained and interpreted utilizing referential montage recording, with reformatting to longitudinal, transverse bipolar, and referential montages as necessary for interpretation. State(s):      Coma (not medically induced) Activation Procedures: Hyperventilation: Not performed Photic Stimulation: Not performed EEG  Description: There is no normal pattern of wake and sleep cycle.  The background is unreactive and consist of burst-suppression pattern. There is 1-2 seconds of delta slowing during burst intermix with 2-4 seconds of suppression. Study limited by electrode artifacts seen at right posterior leads, P8, P4, and O2.   Event: none reported Impression: This is an abnormal EEG study Clinical Correlation: This is consistent with severe diffuse nonspecific cerebral dysfunction. Burst suppression pattern can be seen with hypothermia, hypoxic-ischemic encephalopathy, anesthesia induced coma, etc. Clinical correlation is advised. Dr Larkin Ina [facility] For Inpatient follow-up with [facility] physician please call [facility] [phone]. This is not an outpatient service. Post hospital discharge, please contact hospital directly.   DG Abd Portable 1V  Result Date: 11-15-22 CLINICAL DATA:  OG tube EXAM: PORTABLE ABDOMEN - 1 VIEW COMPARISON:  Chest x-ray November 15, 2022 FINDINGS: Orogastric tube tip is in the distal body of the stomach. No dilated bowel loops are seen. There are minimal patchy opacities in the left lower lung. IMPRESSION: 1. Orogastric tube tip is in the distal body of the stomach. 2. There are minimal patchy opacities in the left lower lung. Electronically Signed   By: Darliss Cheney M.D.   On: 15-Nov-2022 23:57   DG Chest Portable 1 View  Result Date: 11-15-2022 CLINICAL DATA:  Cardiac arrest, CPR, status post intubation. EXAM: PORTABLE CHEST 1 VIEW COMPARISON:  11/06/2022. FINDINGS: Heart is enlarged and the mediastinal contour is stable. The pulmonary vasculature is distended on the left. The proximal esophagus is distended with air. The distal tip of the endotracheal tube terminates 3.3 cm above the carina. Mild atelectasis is present in the perihilar region on the right. No consolidation, effusion, or pneumothorax. The stomach is gas-filled and distended. No acute osseous abnormality.  IMPRESSION: 1. Cardiomegaly with mildly distended pulmonary vasculature on the left. 2. Mild atelectasis in the perihilar region on the right. 3. Endotracheal tube terminates 3.3 cm above the carina. 4. Proximal to mid esophagus and stomach are distended with gas. Electronically Signed   By: Thornell Sartorius M.D.   On: Nov 15, 2022 23:26   ECHOCARDIOGRAM COMPLETE  Result Date: 11/07/2022    ECHOCARDIOGRAM REPORT   Patient Name:   BLINDA TUREK Date of Exam: 11/07/2022 Medical Rec #:  [id number]       Height:       69.0 in Accession #:    [id number]      Weight:       192.2 lb Date of Birth:  1986-05-02       BSA:          2.032 m Patient Age:    36 years        BP:           104/53 mmHg Patient Gender: F               HR:           66 bpm. Exam Location:  Inpatient Procedure: 2D Echo, Cardiac Doppler and Color  Doppler                      STAT ECHO Reported to: Dr Dietrich Pates on 11/07/2022 9:02:00 AM. Indications:    Drug overdose  History:        Patient has no prior history of Echocardiogram examinations.                 Arrythmias:Ventricular Fibrillation. Polysubstance abuse.  Sonographer:    Ross Ludwig RDCS (AE) Referring Phys: [id number] RAHUL P DESAI IMPRESSIONS  1. Left ventricular ejection fraction, by estimation, is 60 to 65%. The left ventricle has normal function. The left ventricle has no regional wall motion abnormalities. Left ventricular diastolic parameters were normal.  2. Right ventricular systolic function is normal. The right ventricular size is normal.  3. The mitral valve is normal in structure. Trivial mitral valve regurgitation.  4. The aortic valve is normal in structure. Aortic valve regurgitation is not visualized.  5. The inferior vena cava is dilated in size with <50% respiratory variability, suggesting right atrial pressure of 15 mmHg. FINDINGS  Left Ventricle: Left ventricular ejection fraction, by estimation, is 60 to 65%. The left ventricle has normal function. The left ventricle has no  regional wall motion abnormalities. The left ventricular internal cavity size was normal in size. There is  no left ventricular hypertrophy. Left ventricular diastolic parameters were normal. Right Ventricle: The right ventricular size is normal. Right vetricular wall thickness was not assessed. Right ventricular systolic function is normal. Left Atrium: Left atrial size was normal in size. Right Atrium: Right atrial size was normal in size. Pericardium: There is no evidence of pericardial effusion. Mitral Valve: The mitral valve is normal in structure. Trivial mitral valve regurgitation. Tricuspid Valve: The tricuspid valve is normal in structure. Tricuspid valve regurgitation is trivial. Aortic Valve: The aortic valve is normal in structure. Aortic valve regurgitation is not visualized. Aortic valve mean gradient measures 6.0 mmHg. Aortic valve peak gradient measures 11.0 mmHg. Aortic valve area, by VTI measures 2.92 cm. Pulmonic Valve: The pulmonic valve was normal in structure. Pulmonic valve regurgitation is not visualized. Aorta: The aortic root is normal in size and structure. Venous: The inferior vena cava is dilated in size with less than 50% respiratory variability, suggesting right atrial pressure of 15 mmHg. IAS/Shunts: No atrial level shunt detected by color flow Doppler.  LEFT VENTRICLE PLAX 2D LVIDd:         5.00 cm   Diastology LVIDs:         3.40 cm   LV e' medial:   18.10 cm/s LV PW:         1.20 cm   LV E/e' medial: 6.5 LV IVS:        0.90 cm LVOT diam:     2.10 cm LV SV:         93 LV SV Index:   46 LVOT Area:     3.46 cm  RIGHT VENTRICLE             IVC RV Basal diam:  2.70 cm     IVC diam: 2.40 cm RV S prime:     13.30 cm/s TAPSE (M-mode): 2.8 cm LEFT ATRIUM           Index        RIGHT ATRIUM           Index LA diam:      3.10 cm 1.53 cm/m   RA  Area:     23.70 cm LA Vol (A2C): 67.8 ml 33.37 ml/m  RA Volume:   76.50 ml  37.66 ml/m LA Vol (A4C): 77.6 ml 38.20 ml/m  AORTIC VALVE AV Area  (Vmax):    2.80 cm AV Area (Vmean):   2.68 cm AV Area (VTI):     2.92 cm AV Vmax:           166.00 cm/s AV Vmean:          115.000 cm/s AV VTI:            0.318 m AV Peak Grad:      11.0 mmHg AV Mean Grad:      6.0 mmHg LVOT Vmax:         134.00 cm/s LVOT Vmean:        89.100 cm/s LVOT VTI:          0.268 m LVOT/AV VTI ratio: 0.84  AORTA Ao Root diam: 2.80 cm Ao Asc diam:  2.70 cm MITRAL VALVE MV Area (PHT): 2.88 cm     SHUNTS MV Decel Time: 263 msec     Systemic VTI:  0.27 m MV E velocity: 118.00 cm/s  Systemic Diam: 2.10 cm MV A velocity: 62.90 cm/s MV E/A ratio:  1.88 Dietrich Pates MD Electronically signed by Dietrich Pates MD Signature Date/Time: 11/07/2022/9:45:26 AM    Final    DG Foot 2 Views Left  Result Date: 11/07/2022 CLINICAL DATA:  Recent trauma, pain EXAM: LEFT FOOT - 2 VIEW COMPARISON:  09/07/2020 FINDINGS: There is 3 mm linear calcific density adjacent to the base of left fifth metatarsal. This finding was not seen in the previous study. Rest of the bony structures are unremarkable. Bony spurs are noted in the dorsal aspect of talonavicular joint. Possible minimal bony spurs seen in first metatarsophalangeal joint. There is possible tiny plantar spur in calcaneus. There is soft tissue swelling over the dorsum. IMPRESSION: There is 3 mm avulsion fracture in the base of left fifth metatarsal. Electronically Signed   By: Ernie Avena M.D.   On: 11/07/2022 09:30   DG Ankle 2 Views Left  Result Date: 11/07/2022 CLINICAL DATA:  Recent fall, pain EXAM: LEFT ANKLE - 2 VIEW COMPARISON:  None Available. FINDINGS: No recent fracture or dislocation is seen. Small bony spurs are noted in the dorsal aspect of talonavicular joint. There is possible tiny plantar spur in calcaneus. IMPRESSION: No recent fracture or dislocation is seen. There is possible tiny plantar spur in calcaneus. Bony spurs are noted in the dorsal aspect of talonavicular joint. Electronically Signed   By: Ernie Avena M.D.    On: 11/07/2022 09:27   DG Chest Portable 1 View  Result Date: 11/06/2022 CLINICAL DATA:  Found down. EXAM: PORTABLE CHEST 1 VIEW COMPARISON:  Chest x-ray 06/02/2021 FINDINGS: The heart size and mediastinal contours are within normal limits. Both lungs are clear. The visualized skeletal structures are unremarkable. IMPRESSION: No active disease. Electronically Signed   By: Darliss Cheney M.D.   On: 11/06/2022 21:52    Microbiology Recent Results (from the past 240 hour(s))  Blood culture (routine x 2)     Status: Abnormal   Collection Time: 11/06/22 10:52 PM   Specimen: BLOOD RIGHT ARM  Result Value Ref Range Status   Specimen Description BLOOD RIGHT ARM  Final   Special Requests   Final    BOTTLES DRAWN AEROBIC AND ANAEROBIC Blood Culture adequate volume   Culture  Setup Time  Final    GRAM POSITIVE COCCI IN CHAINS ANAEROBIC BOTTLE ONLY CRITICAL RESULT CALLED TO, READ BACK BY AND VERIFIED WITH: RN SARAH JAMES ON 11/07/22 @ 1900 BY DRT    Culture (A)  Final    STREPTOCOCCUS MITIS/ORALIS THE SIGNIFICANCE OF ISOLATING THIS ORGANISM FROM A SINGLE SET OF BLOOD CULTURES WHEN MULTIPLE SETS ARE DRAWN IS UNCERTAIN. PLEASE NOTIFY THE MICROBIOLOGY DEPARTMENT WITHIN ONE WEEK IF SPECIATION AND SENSITIVITIES ARE REQUIRED. Performed at [facility], [address], [place], [place] [address]    Report Status 11/09/2022 FINAL  Final  Blood Culture ID Panel (Reflexed)     Status: Abnormal   Collection Time: 11/06/22 10:52 PM  Result Value Ref Range Status   Enterococcus faecalis NOT DETECTED NOT DETECTED Final   Enterococcus Faecium NOT DETECTED NOT DETECTED Final   Listeria monocytogenes NOT DETECTED NOT DETECTED Final   Staphylococcus species NOT DETECTED NOT DETECTED Final   Staphylococcus aureus (BCID) NOT DETECTED NOT DETECTED Final   Staphylococcus epidermidis NOT DETECTED NOT DETECTED Final   Staphylococcus lugdunensis NOT DETECTED NOT DETECTED Final   Streptococcus species DETECTED  (A) NOT DETECTED Final    Comment: Not Enterococcus species, Streptococcus agalactiae, Streptococcus pyogenes, or Streptococcus pneumoniae. CRITICAL RESULT CALLED TO, READ BACK BY AND VERIFIED WITH: RN SARAH JAMES ON 11/07/22 @ 1900 BY DRT    Streptococcus agalactiae NOT DETECTED NOT DETECTED Final   Streptococcus pneumoniae NOT DETECTED NOT DETECTED Final   Streptococcus pyogenes NOT DETECTED NOT DETECTED Final   A.calcoaceticus-baumannii NOT DETECTED NOT DETECTED Final   Bacteroides fragilis NOT DETECTED NOT DETECTED Final   Enterobacterales NOT DETECTED NOT DETECTED Final   Enterobacter cloacae complex NOT DETECTED NOT DETECTED Final   Escherichia coli NOT DETECTED NOT DETECTED Final   Klebsiella aerogenes NOT DETECTED NOT DETECTED Final   Klebsiella oxytoca NOT DETECTED NOT DETECTED Final   Klebsiella pneumoniae NOT DETECTED NOT DETECTED Final   Proteus species NOT DETECTED NOT DETECTED Final   Salmonella species NOT DETECTED NOT DETECTED Final   Serratia marcescens NOT DETECTED NOT DETECTED Final   Haemophilus influenzae NOT DETECTED NOT DETECTED Final   Neisseria meningitidis NOT DETECTED NOT DETECTED Final   Pseudomonas aeruginosa NOT DETECTED NOT DETECTED Final   Stenotrophomonas maltophilia NOT DETECTED NOT DETECTED Final   Candida albicans NOT DETECTED NOT DETECTED Final   Candida auris NOT DETECTED NOT DETECTED Final   Candida glabrata NOT DETECTED NOT DETECTED Final   Candida krusei NOT DETECTED NOT DETECTED Final   Candida parapsilosis NOT DETECTED NOT DETECTED Final   Candida tropicalis NOT DETECTED NOT DETECTED Final   Cryptococcus neoformans/gattii NOT DETECTED NOT DETECTED Final    Comment: Performed at [facility], [address] [place], [place] [address]  Blood culture (routine x 2)     Status: None   Collection Time: 11/07/22  1:48 AM   Specimen: BLOOD RIGHT FOREARM  Result Value Ref Range Status   Specimen Description BLOOD RIGHT FOREARM  Final    Special Requests   Final    BOTTLES DRAWN AEROBIC AND ANAEROBIC Blood Culture adequate volume   Culture   Final    NO GROWTH 5 DAYS Performed at [facility], [address] [place], [place] [address]    Report Status 11/12/2022 FINAL  Final  Urine Culture     Status: Abnormal   Collection Time: 11/07/22  1:59 AM   Specimen: Urine, Clean Catch  Result Value Ref Range Status  Specimen Description URINE, CLEAN CATCH  Final   Special Requests   Final    NONE Performed at [facility], [address] [place], [place] [address]    Culture 20,000 COLONIES/mL STAPHYLOCOCCUS EPIDERMIDIS (A)  Final   Report Status 11/09/2022 FINAL  Final   Organism ID, Bacteria STAPHYLOCOCCUS EPIDERMIDIS (A)  Final      Susceptibility   Staphylococcus epidermidis - MIC*    CIPROFLOXACIN <=0.5 SENSITIVE Sensitive     GENTAMICIN <=0.5 SENSITIVE Sensitive     NITROFURANTOIN <=16 SENSITIVE Sensitive     OXACILLIN >=4 RESISTANT Resistant     TETRACYCLINE <=1 SENSITIVE Sensitive     VANCOMYCIN 1 SENSITIVE Sensitive     TRIMETH/SULFA <=10 SENSITIVE Sensitive     CLINDAMYCIN <=0.25 SENSITIVE Sensitive     RIFAMPIN <=0.5 SENSITIVE Sensitive     Inducible Clindamycin NEGATIVE Sensitive     * 20,000 COLONIES/mL STAPHYLOCOCCUS EPIDERMIDIS  MRSA Next Gen by PCR, Nasal     Status: None   Collection Time: 11/07/22  1:59 AM   Specimen: Urine, Clean Catch; Nasal Swab  Result Value Ref Range Status   MRSA by PCR Next Gen NOT DETECTED NOT DETECTED Final    Comment: (NOTE) The GeneXpert MRSA Assay (FDA approved for NASAL specimens only), is one component of a comprehensive MRSA colonization surveillance program. It is not intended to diagnose MRSA infection nor to guide or monitor treatment for MRSA infections. Test performance is not FDA approved in patients less than 37 years old. Performed at [facility], [address] [place], [place] [address]   MRSA Next Gen by PCR, Nasal     Status:  None   Collection Time: 11/11/22  3:42 AM   Specimen: Nasal Mucosa; Nasal Swab  Result Value Ref Range Status   MRSA by PCR Next Gen NOT DETECTED NOT DETECTED Final    Comment: (NOTE) The GeneXpert MRSA Assay (FDA approved for NASAL specimens only), is one component of a comprehensive MRSA colonization surveillance program. It is not intended to diagnose MRSA infection nor to guide or monitor treatment for MRSA infections. Test performance is not FDA approved in patients less than 37 years old. Performed at [facility], [address] [place], [place] [address]   Culture, Respiratory w Gram Stain     Status: None   Collection Time: 11/11/22  8:53 AM   Specimen: Tracheal Aspirate; Respiratory  Result Value Ref Range Status   Specimen Description TRACHEAL ASPIRATE  Final   Special Requests NONE  Final   Gram Stain   Final    FEW WBC PRESENT, PREDOMINANTLY PMN ABUNDANT GRAM POSITIVE COCCI IN CLUSTERS IN PAIRS IN CHAINS Performed at [facility], [address] [place], [place] [address]    Culture ABUNDANT STAPHYLOCOCCUS AUREUS  Final   Report Status 11/13/2022 FINAL  Final   Organism ID, Bacteria STAPHYLOCOCCUS AUREUS  Final      Susceptibility   Staphylococcus aureus - MIC*    CIPROFLOXACIN <=0.5 SENSITIVE Sensitive     ERYTHROMYCIN <=0.25 SENSITIVE Sensitive     GENTAMICIN <=0.5 SENSITIVE Sensitive     OXACILLIN 0.5 SENSITIVE Sensitive     TETRACYCLINE <=1 SENSITIVE Sensitive     VANCOMYCIN <=0.5 SENSITIVE Sensitive     TRIMETH/SULFA <=10 SENSITIVE Sensitive     CLINDAMYCIN <=0.25 SENSITIVE Sensitive     RIFAMPIN <=0.5 SENSITIVE Sensitive     Inducible Clindamycin NEGATIVE Sensitive     * ABUNDANT  STAPHYLOCOCCUS AUREUS  Culture, blood (Routine X 2) w Reflex to ID Panel     Status: None (Preliminary result)   Collection Time: 11/11/22  9:40 AM   Specimen: BLOOD  Result Value Ref Range Status   Specimen Description BLOOD BLOOD RIGHT ARM  Final   Special  Requests IN PEDIATRIC BOTTLE Blood Culture adequate volume  Final   Culture   Final    NO GROWTH 3 DAYS Performed at [facility], [address] [place], [place] [address]    Report Status PENDING  Incomplete  Culture, blood (Routine X 2) w Reflex to ID Panel     Status: None (Preliminary result)   Collection Time: 11/11/22  9:40 AM   Specimen: BLOOD  Result Value Ref Range Status   Specimen Description BLOOD BLOOD RIGHT ARM  Final   Special Requests IN PEDIATRIC BOTTLE Blood Culture adequate volume  Final   Culture   Final    NO GROWTH 3 DAYS Performed at [facility], [address] [place], [place] [address]    Report Status PENDING  Incomplete    Lab Basic Metabolic Panel: Recent Labs  Lab 11/11/22 0255 11/11/22 0311 11/11/22 0859 11/11/22 1135 11/12/22 0155 11/12/22 0849 11/12/22 1709 11/12/22 1736 11/13/22 0507 11/13/22 0804 11/13/22 1414 11/13/22 1950 11/13/22 2302 11/29/22 0302 November 29, 2022 0610 Nov 29, 2022 0937  NA  --    < > 145   < > 139   < > 135   < > 135  --  133* 131* 133* 133* 133* 130*  K  --    < > 4.8   < > 3.6   < > 4.8   < > 6.0*  --  6.7* 6.9* 5.8* 5.3* 5.4* 5.0  CL  --    < > 101   < > 91*   < > 95*   < > 97*  --  99 99 99 99 98  --   CO2  --   --  12*   < > 18*   < > 20*   < > 18*  --  20* 19* 17* 16* 17*  --   GLUCOSE  --    < > 278*   < > 73   < > 99   < > 97  --  128* 117* 103* 93 98  --   BUN  --    < > 23*   < > 21*   < > 21*   < > 19  --  19 18 17 18 18   --   CREATININE  --    < > 4.36*   < > 3.74*   < > 3.15*   < > 2.69*  --  2.50* 2.53* 2.41* 2.39* 2.48*  --   CALCIUM  --   --  8.8*   < > 6.3*   < > 7.1*   < > 7.1*  --  6.6* 7.2* 7.2* 7.0* 7.2*  --   MG 3.0*  --  2.5*  --  2.2  --   --   --   --  2.9*  --   --   --   --  2.7*  --   PHOS >30.0*  --  >30.0*   < > 7.6*  --  6.8*  --  5.9*  --  7.4*  --   --  5.4*  --   --    < > = values in  this interval not displayed.   Liver Function Tests: Recent Labs  Lab 11/18/2022 2307  11/11/22 0859 11/11/22 1640 11/12/22 0155 11/12/22 1709 11/13/22 0325 11/13/22 0507 11/13/22 1414 11/13/22 2302 11/27/22 0302  AST 4,600* >10,000*  --  >10,000*  --  >10,000*  --   --  7,325*  --   ALT 2,320* 4,666*  --  4,744*  --  3,757*  --   --  2,918*  --   ALKPHOS 56 69  --  111  --  123  --   --  159*  --   BILITOT 0.7 1.7*  --  4.1*  --  6.5*  --   --  8.1*  --   PROT 5.7* 4.0*  --  5.0*  --  4.8*  --   --  4.7*  --   ALBUMIN 3.1* 2.2*   < > 2.8*  2.8*   < > 3.0* 3.2* 3.2* 3.0* 2.9*   < > = values in this interval not displayed.   No results for input(s): "LIPASE", "AMYLASE" in the last 168 hours. No results for input(s): "AMMONIA" in the last 168 hours. CBC: Recent Labs  Lab 11/13/2022 2307 11/13/2022 2345 11/11/22 0255 11/11/22 0311 11/11/22 0859 11/11/22 1638 11/12/22 0345 11/12/22 0849 11/12/22 1315 11/12/22 1736 11/12/22 2310 11/13/22 0804 11/13/22 2302 11-27-2022 0630 2022/11/27 0937  WBC 9.0  --  17.9*  --  16.9*  --  15.5*  --   --   --   --  14.0*  --  19.3*  --   NEUTROABS 5.3  --   --   --   --   --   --   --   --   --   --   --   --   --   --   HGB 12.7   < > 12.5   < > 10.2*  --  9.9* 9.5*  --  8.8*  --  8.4*  --  8.3* 8.2*  HCT 43.3   < > 43.0   < > 34.3*  --  28.4* 28.0*  --  26.0*  --  25.7*  --  26.8* 24.0*  MCV 113.9*  --  111.1*  --  108.2*  --  95.6  --   --   --   --  100.0  --  104.3*  --   PLT 244  --  174  --  187  190   < > 104*  --  93*  --  57* 49* 28* 22*  --    < > = values in this interval not displayed.   Cardiac Enzymes: Recent Labs  Lab 11/13/22 1414 11/13/22 1950 11/13/22 2302 11/27/22 0610 11/27/22 0944  CKTOTAL 47,891* 48,292* 16,109* 60,454* >50,000*   Sepsis Labs: Recent Labs  Lab 11/11/22 0859 11/11/22 1135 11/11/22 1646 11/11/22 2310 11/12/22 0345 11/12/22 1315 11/13/22 0804 11/13/22 1414 November 27, 2022 0630  WBC 16.9*  --   --   --  15.5*  --  14.0*  --  19.3*  LATICACIDVEN >9.0*   < > >9.0* >9.0*  --  >9.0*  --   8.4*  --    < > = values in this interval not displayed.    Procedures/Operations  Intubation Aline CVC HD catheter insertion   Steffanie Dunn 11/27/22, 6:48 PM

## 2022-12-05 NOTE — Progress Notes (Signed)
EEG LTM D/C'd. No skin breakdown however there was a pressure spot under F7. [facility] was called.

## 2022-12-05 NOTE — Progress Notes (Signed)
Nutrition Follow-up  DOCUMENTATION CODES:   Not applicable  INTERVENTION:   Trickle tube feeds via OG tube: - Change to Vital 1.5 @ 20 ml/hr (480 ml/day)   RD will monitor for ability to advance tube feeding regimen to goal: - Vital 1.5 @ 60 ml/hr (1440 ml/day) - PROSource TF20 60 ml BID  Recommended tube feeding regimen at goal rate would provide 2320 kcal, 137 grams of protein, and 1100 ml of H2O.  - Renal MVI daily to account for losses with CRRT  NUTRITION DIAGNOSIS:   Inadequate oral intake related to inability to eat as evidenced by NPO status.  Ongoing, being addressed via trickle TF  GOAL:   Patient will meet greater than or equal to 90% of their needs  Unmet at this time, pt only on trickle TF  MONITOR:   Vent status, Labs, Weight trends, I & O's  REASON FOR ASSESSMENT:   Ventilator, Consult Enteral/tube feeding initiation and management (trickle tube feeds)  ASSESSMENT:   37 year old female who presented to the ED on 12/07 post-cardiac arrest, presumed drug overdose. Recently admitted 12/3-12/4 with acute overdose. PMH of polysubstance abuse, depression.  12/08 - CRRT to start  Discussed pt with RN and during ICU rounds. Pt remains on CRRT. Hyperkalemia improved and potassium WNL. Pt with MSSA pneumonia and pressure injuries related to prolonged downtime PTA. Family discussions regarding GOC are ongoing.  Pt with non-pitting generalized edema.  Admit weight: 91.4 kg Current weight: 101.6 kg  Patient remains intubated on ventilator support MV: 17.6 L/min Temp (24hrs), Avg:96.6 F (35.9 C), Min:93.9 F (34.4 C), Max:99.3 F (37.4 C)  Drips: Versed Levophed: 13 mcg/min Vasopressin: 0.04 units/min D10: 60 ml/hr  Medications reviewed and include: IV solu-cortef, SSI q 4 hours, IV protonix, IV calcium gluconate 2 grams x 1, IV abx, IV vitamin K 10 mg x 1  Labs reviewed: sodium 130, creatinine 2.48, ionized calcium 0.97, phosphorus 5.4,  magnesium 2.7, elevated LFTs, lactic acid 8.4 on 12/10, WBC 19.3, hemoglobin 8.2, platelets 22, INR 5.0 CBG's: 100-124 x 24 hours  CRRT UF: 2740 ml x 24 hours I/O's: +11.5 L since admit  Diet Order:   Diet Order             Diet NPO time specified  Diet effective now                   EDUCATION NEEDS:   Not appropriate for education at this time  Skin:  Skin Assessment: Reviewed RN Assessment  Last BM:  12/05/2022 type 7  Height:   Ht Readings from Last 1 Encounters:  11/06/22 5\' 9"  (1.753 m)    Weight:   Wt Readings from Last 1 Encounters:  Dec 05, 2022 101.6 kg    Ideal Body Weight:  65.9 kg  BMI:  Body mass index is 33.08 kg/m.  Estimated Nutritional Needs:   Kcal:  2100-2300  Protein:  130-150 grams  Fluid:  >2.0 L    14/11/23, MS, RD, LDN Inpatient Clinical Dietitian Please see AMiON for contact information.

## 2022-12-05 NOTE — TOC Progression Note (Signed)
Transition of Care [facility]) - Progression Note    Patient Details  Name: Erika Taylor MRN: [id number] Date of Birth: 28-Nov-1986  Transition of Care [facility]) CM/SW Contact  Tom-Johnson, Hershal Coria, RN Phone Number: 2022/11/28, 4:39 PM  Clinical Narrative:     Patient admitted for Cardiac Arrest. GOC meeting with family and plan to d/c CRRT, MV, Pressors and focus on in patient comfort care. TOC will continue to follow and support through this transition period.         Expected Discharge Plan and Services                                                 Social Determinants of Health (SDOH) Interventions    Readmission Risk Interventions     No data to display

## 2022-12-05 NOTE — Progress Notes (Signed)
Pt transitioned to comfort care, honor bride notified, pt is a Adult nurse case. Pt asystole on monitor, heart and lungs sounds auscultated pt pronounced by Julius Bowels, RN and Acey Lav, RN.

## 2022-12-05 NOTE — Progress Notes (Signed)
NAME:  Erika Taylor, MRN:  [id number], DOB:  Aug 29, 1986, LOS: 3 ADMISSION DATE:  11/09/2022, CONSULTATION DATE:  11/11/2022 REFERRING MD: Alvy Beal, PA, CHIEF COMPLAINT: Cardiac arrest  History of Present Illness:  This is a 37 year old female, past medical history of depression, arrived via EMS, known history of polysubstance use.  Recently admitted 12/3-12/4 with acute overdose, narcan responsive.   Patient was last seen by family at least around 45 minutes prior to being found unresponsive pulseless and apneic.  She was given Narcan by the fire department and CPR was initiated at 2145 by EMS.  She obtained ROSC but lost pulse x 2.  She was brought to to the emergency department she had a left tibial IO placed with epinephrine infusing.  12/8 with progressive hypotension and acidosis. Nephrology consulted and patient started on CRRT.   Pertinent  Medical History  Polysubstance Abuse  Depression   Significant Hospital Events: Including procedures, antibiotic start and stop dates in addition to other pertinent events   12/7 > Presents to ED post-cardiac arrest. Overdose  12/8 > progressive hypotension and acidosis. Nephrology consulted and patient started on CRRT.  12/9 > patient with clinical signs of seizures. EEG replaced. Noted status. Get keppra load and started on versed gtt.   Interim History / Subjective:  Remains on 2 pressors, CRRT. On versed with cEEG.  Objective   Blood pressure (!) 122/28, pulse 73, temperature 99.1 F (37.3 C), resp. rate (!) 31, weight 101.6 kg, SpO2 97 %.    Vent Mode: PRVC FiO2 (%):  [40 %] 40 % Set Rate:  [30 bmp] 30 bmp Vt Set:  [530 mL] 530 mL PEEP:  [5 cmH20] 5 cmH20 Pressure Support:  [8 cmH20] 8 cmH20 Plateau Pressure:  [20 cmH20-23 cmH20] 23 cmH20   Intake/Output Summary (Last 24 hours) at 11/30/22 1700 Last data filed at Nov 30, 2022 0900 Gross per 24 hour  Intake 2978.66 ml  Output 2857 ml  Net 121.66 ml    Filed Weights    11/12/22 0500 11/13/22 0500 11-30-2022 0500  Weight: 97.8 kg 101.4 kg 101.6 kg    Examination: General: critically ill appearing woman, intubated, sedated on versed 2mg  during my exam  HENT: [place]/AT, eyes icteric, scleral edema Lungs: S1S2, CTA, minimal ETT secretions  Cardiovascular: S1S2, RRR Abdomen: soft, absent bowel sounds Extremities: ++Edema in extremities, compartments in forearms and lower legs compressible. Starting to get blisters in RUE.  R femoral A-line & CVC. L lateral leg pressure injury below knee-- purple discoloration. Neuro: Not responding to verbal stimualtion. Pupils midrange, not reactive. No corneals or doll's eyes. No tracheal cough. Breathing over the vent intermittently. Not withdrawing from pain in extremities.   Na+ 133 K+ 5.4 Bicarb 17 BUN 18 Cr 2.48 WBC 19.3 H/H 8.3/26.8  Platelets 22 Trach aspirate> MSSA  Resolved Hospital Problem list     Assessment & Plan:   Acute metabolic encephalopathy multifactorial in setting of acute drug overdose, post-cardiac arrest with concern for severe anoxic brain injury, +severe metabolic derangements  Status Epilepticus  History of polysubstance abuse; UDS + Opiates, Cocaine, Benzo -appreciate neurology's management -d/c versed per neuro -cEEG-- no seizures -keppra per neuro  Profound shock- likely multifactorial septic, possibly cardiogenic. Recent BC +STREPTOCOCCUS MITIS/ORALIS in 1/2 bottles-- not sure if contaminant MSSA pneumonia -con't antibiotics> deescalate to cefazolin to complete 7 days of antibiotics -vasopressors PRN to maintain MAP >65 -stress dose steroids  Cardiac arrest with unclear down-time in setting of acute overdose  Elevated Troponin in setting  of demand ischemia  -tele monitoring -con't supportive care  Pressure injuries related to prolonged downtime PTA -wound care  Acute respiratory failure due to post cardiac arrest & encephalopathy; ABG with persistent metabolic acidosis with  respiratory compensation -LTVV -VAP prevention protocol -PAD protocol for sedation  Severe metabolic acidosis, lactic acidosis, AKI Hyponatremia Hyperkalemia- improved on CRRT Hypocalcemia  Rhabdomyolysis with unknown downtime  Plan  -con't CRRT per nephrology -trend CK levels, BMP  Shock liver causing coagulopathy & hypoglycemia. No DIC.  S/P 2 FFP and 10 Vitamin K  -monitor LFTs -repeat vit K today -con't d10w -repeat DIC panel pending  At risk for malnutrition -TF start with trickle today  Best Practice (right click and "Reselect all SmartList Selections" daily)   Diet/type: tubefeeds DVT prophylaxis: SCD GI prophylaxis: PPI Lines: Central line, Dialysis Catheter, and Arterial Line Foley:  Yes, and it is still needed Code Status:  full code Last date of multidisciplinary goals of care discussion have attempted multiple times to call family with no answer.   Labs   CBC: Recent Labs  Lab 11/04/2022 2307 11/05/2022 2345 11/11/22 0255 11/11/22 0311 11/11/22 0859 11/11/22 1638 11/12/22 0345 11/12/22 0849 11/12/22 1315 11/12/22 1736 11/12/22 2310 11/13/22 0804 11/13/22 2302  WBC 9.0  --  17.9*  --  16.9*  --  15.5*  --   --   --   --  14.0*  --   NEUTROABS 5.3  --   --   --   --   --   --   --   --   --   --   --   --   HGB 12.7   < > 12.5   < > 10.2*  --  9.9* 9.5*  --  8.8*  --  8.4*  --   HCT 43.3   < > 43.0   < > 34.3*  --  28.4* 28.0*  --  26.0*  --  25.7*  --   MCV 113.9*  --  111.1*  --  108.2*  --  95.6  --   --   --   --  100.0  --   PLT 244  --  174  --  187  190   < > 104*  --  93*  --  57* 49* 28*   < > = values in this interval not displayed.     Basic Metabolic Panel: Recent Labs  Lab 11/11/22 0255 11/11/22 0311 11/11/22 0859 11/11/22 1135 11/12/22 0155 11/12/22 0849 11/12/22 1709 11/12/22 1736 11/13/22 0507 11/13/22 0804 11/13/22 1414 11/13/22 1950 11/13/22 2302 11/23/22 0302 November 23, 2022 0610  NA  --    < > 145   < > 139   < > 135    < > 135  --  133* 131* 133* 133* 133*  K  --    < > 4.8   < > 3.6   < > 4.8   < > 6.0*  --  6.7* 6.9* 5.8* 5.3* 5.4*  CL  --    < > 101   < > 91*   < > 95*   < > 97*  --  99 99 99 99 98  CO2  --   --  12*   < > 18*   < > 20*   < > 18*  --  20* 19* 17* 16* 17*  GLUCOSE  --    < > 278*   < > 73   < >  99   < > 97  --  128* 117* 103* 93 98  BUN  --    < > 23*   < > 21*   < > 21*   < > 19  --  19 18 17 18 18   CREATININE  --    < > 4.36*   < > 3.74*   < > 3.15*   < > 2.69*  --  2.50* 2.53* 2.41* 2.39* 2.48*  CALCIUM  --   --  8.8*   < > 6.3*   < > 7.1*   < > 7.1*  --  6.6* 7.2* 7.2* 7.0* 7.2*  MG 3.0*  --  2.5*  --  2.2  --   --   --   --  2.9*  --   --   --   --  2.7*  PHOS >30.0*  --  >30.0*   < > 7.6*  --  6.8*  --  5.9*  --  7.4*  --   --  5.4*  --    < > = values in this interval not displayed.    GFR: Estimated Creatinine Clearance: 39.8 mL/min (A) (by C-G formula based on SCr of 2.48 mg/dL (H)). Recent Labs  Lab 11/11/22 0255 11/11/22 0859 11/11/22 1135 11/11/22 1646 11/11/22 2310 11/12/22 0345 11/12/22 1315 11/13/22 0804 11/13/22 1414  WBC 17.9* 16.9*  --   --   --  15.5*  --  14.0*  --   LATICACIDVEN  --  >9.0*   < > >9.0* >9.0*  --  >9.0*  --  8.4*   < > = values in this interval not displayed.     Liver Function Tests: Recent Labs  Lab 11/09/2022 2307 11/11/22 0859 11/11/22 1640 11/12/22 0155 11/12/22 1709 11/13/22 0325 11/13/22 0507 11/13/22 1414 11/13/22 2302 12/10/2022 0302  AST 4,600* >10,000*  --  >10,000*  --  >10,000*  --   --  7,325*  --   ALT 2,320* 4,666*  --  4,744*  --  3,757*  --   --  2,918*  --   ALKPHOS 56 69  --  111  --  123  --   --  159*  --   BILITOT 0.7 1.7*  --  4.1*  --  6.5*  --   --  8.1*  --   PROT 5.7* 4.0*  --  5.0*  --  4.8*  --   --  4.7*  --   ALBUMIN 3.1* 2.2*   < > 2.8*  2.8*   < > 3.0* 3.2* 3.2* 3.0* 2.9*   < > = values in this interval not displayed.    ABG    Component Value Date/Time   PHART 7.257 (L) 12-10-22 0937    PCO2ART 37.4 12/10/22 0937   PO2ART 61 (L) Dec 10, 2022 0937   HCO3 16.7 (L) 2022-12-10 0937   TCO2 18 (L) 2022/12/10 0937   ACIDBASEDEF 10.0 (H) 12-10-22 0937   O2SAT 88 12-10-2022 0937      This patient is critically ill with multiple organ system failure which requires frequent high complexity decision making, assessment, support, evaluation, and titration of therapies. This was completed through the application of advanced monitoring technologies and extensive interpretation of multiple databases. During this encounter critical care time was devoted to patient care services described in this note for 41 minutes.  14/10/2022, DO 12/10/22 9:50 AM [facility]  For contact information, see  Amion. If no response to pager, please call PCCM consult pager. After hours, 7PM- 7AM, please call Elink.

## 2022-12-05 NOTE — Progress Notes (Signed)
Subjective: Continues to be unresponsive.   Objective: Current vital signs: BP (!) 122/28   Pulse 69   Temp (!) 97 F (36.1 C)   Resp (!) 31   Wt 101.6 kg   SpO2 91%   BMI 33.08 kg/m  Vital signs in last 24 hours: Temp:  [93.9 F (34.4 C)-99.3 F (37.4 C)] 97 F (36.1 C) (12/11 1000) Pulse Rate:  [61-74] 69 (12/11 1000) Resp:  [17-34] 31 (12/11 1000) BP: (122)/(28) 122/28 (12/10 1107) SpO2:  [87 %-100 %] 91 % (12/11 1000) Arterial Line BP: (91-134)/(25-44) 113/29 (12/11 1000) FiO2 (%):  [40 %] 40 % (12/11 0748) Weight:  [101.6 kg] 101.6 kg (12/11 0500)  Intake/Output from previous day: 12/10 0701 - 12/11 0700 In: 3136.1 [I.V.:2104.8; NG/GT:196.3; IV Piggyback:835] Out: 2740  Intake/Output this shift: Total I/O In: 501.3 [I.V.:257.6; NG/GT:60; IV Piggyback:183.6] Out: 154  Nutritional status:  Diet Order             Diet NPO time specified  Diet effective now                  HEENT: [place]/AT. Scleral icterus noted.  Skin: Jaundiced Ext: Diffuse limb edema x 4  Neurologic Exam: Performed while on Versed gtt at 2 mg/hr.  Ment: No responses to any external stimuli. No spontaneous movement. No eye opening spontaneously or to any stimuli.  CN: Pupils 4 mm, midposition and fixed, eyes are conjugate at the midline, absent oculocephalic reflex, no corneal reflex, no gag.    Motor/Sensory: No responses to noxious stimuli in all four extremities. Flaccid tone x 4. No posturing noted.  Reflexes: Hypoactive x 4 Other: No abnormal facial movement or rhythmic arm twitching noted.   Lab Results: Results for orders placed or performed during the hospital encounter of 11/13/2022 (from the past 48 hour(s))  Basic metabolic panel     Status: Abnormal   Collection Time: 11/12/22  1:15 PM  Result Value Ref Range   Sodium 135 135 - 145 mmol/L   Potassium 4.4 3.5 - 5.1 mmol/L   Chloride 92 (L) 98 - 111 mmol/L   CO2 21 (L) 22 - 32 mmol/L   Glucose, Bld 74 70 - 99 mg/dL     Comment: Glucose reference range applies only to samples taken after fasting for at least 8 hours.   BUN 21 (H) 6 - 20 mg/dL   Creatinine, Ser 1.61 (H) 0.44 - 1.00 mg/dL   Calcium 6.9 (L) 8.9 - 10.3 mg/dL   GFR, Estimated 17 (L) >60 mL/min    Comment: (NOTE) Calculated using the CKD-EPI Creatinine Equation (2021)    Anion gap 22 (H) 5 - 15    Comment: ELECTROLYTES REPEATED TO VERIFY Performed at [facility] Lab, [address], [place], [place] [address]   CK     Status: Abnormal   Collection Time: 11/12/22  1:15 PM  Result Value Ref Range   Total CK 18,962 (H) 38 - 234 U/L    Comment: RESULT CONFIRMED BY MANUAL DILUTION Performed at [facility] Lab, [address], [place], [place] [address]   Troponin I (High Sensitivity)     Status: Abnormal   Collection Time: 11/12/22  1:15 PM  Result Value Ref Range   Troponin I (High Sensitivity) 20,373 (HH) <18 ng/L    Comment: CRITICAL VALUE NOTED. VALUE IS CONSISTENT WITH PREVIOUSLY REPORTED/CALLED VALUE (NOTE) Elevated high sensitivity troponin I (hsTnI) values and significant  changes across serial measurements may suggest  ACS but many other  chronic and acute conditions are known to elevate hsTnI results.  Refer to the "Links" section for chest pain algorithms and additional  guidance. Performed at [facility] Lab, [address] [place], [place] [address]   Lactic acid, plasma     Status: Abnormal   Collection Time: 11/12/22  1:15 PM  Result Value Ref Range   Lactic Acid, Venous >9.0 (HH) 0.5 - 1.9 mmol/L    Comment: CRITICAL VALUE NOTED. VALUE IS CONSISTENT WITH PREVIOUSLY REPORTED/CALLED VALUE Performed at [facility], [address], [place], [place] [address]   DIC Panel ONCE - STAT     Status: Abnormal   Collection Time: 11/12/22  1:15 PM  Result Value Ref Range   Prothrombin Time 43.8 (H) 11.4 - 15.2 seconds   INR 4.7 (HH) 0.8 - 1.2    Comment: REPEATED TO VERIFY CRITICAL RESULT CALLED TO, READ BACK BY  AND VERIFIED WITH: FLYNT,A RN @1422  ON 11/12/22 FLEMINGS (NOTE) INR goal varies based on device and disease states.    aPTT 54 (H) 24 - 36 seconds    Comment:        IF BASELINE aPTT IS ELEVATED, SUGGEST PATIENT RISK ASSESSMENT BE USED TO DETERMINE APPROPRIATE ANTICOAGULANT THERAPY.    Fibrinogen 124 (L) 210 - 475 mg/dL    Comment: (NOTE) Fibrinogen results may be underestimated in patients receiving thrombolytic therapy.    D-Dimer, Quant >20.00 (H) 0.00 - 0.50 ug/mL-FEU    Comment: (NOTE) At the manufacturer cut-off value of 0.5 g/mL FEU, this assay has a negative predictive value of 95-100%.This assay is intended for use in conjunction with a clinical pretest probability (PTP) assessment model to exclude pulmonary embolism (PE) and deep venous thrombosis (DVT) in outpatients suspected of PE or DVT. Results should be correlated with clinical presentation.    Platelets 93 (L) 150 - 400 K/uL    Comment: Immature Platelet Fraction may be clinically indicated, consider ordering this additional test [id number] REPEATED TO VERIFY PLATELET COUNT CONFIRMED BY SMEAR    Smear Review NO SCHISTOCYTES SEEN     Comment: Performed at [facility] Lab, [address], [place], [place] [address]  Glucose, capillary     Status: None   Collection Time: 11/12/22  2:55 PM  Result Value Ref Range   Glucose-Capillary 72 70 - 99 mg/dL    Comment: Glucose reference range applies only to samples taken after fasting for at least 8 hours.  Glucose, capillary     Status: Abnormal   Collection Time: 11/12/22  4:35 PM  Result Value Ref Range   Glucose-Capillary 66 (L) 70 - 99 mg/dL    Comment: Glucose reference range applies only to samples taken after fasting for at least 8 hours.  Renal function panel (daily at 1600)     Status: Abnormal   Collection Time: 11/12/22  5:09 PM  Result Value Ref Range   Sodium 135 135 - 145 mmol/L   Potassium 4.8 3.5 - 5.1 mmol/L   Chloride 95 (L) 98 - 111 mmol/L    CO2 20 (L) 22 - 32 mmol/L   Glucose, Bld 99 70 - 99 mg/dL    Comment: Glucose reference range applies only to samples taken after fasting for at least 8 hours.   BUN 21 (H) 6 - 20 mg/dL   Creatinine, Ser 8.46 (H) 0.44 - 1.00 mg/dL   Calcium 7.1 (L) 8.9 - 10.3 mg/dL   Phosphorus 6.8 (H) 2.5 -  4.6 mg/dL   Albumin 2.7 (L) 3.5 - 5.0 g/dL   GFR, Estimated 19 (L) >60 mL/min    Comment: (NOTE) Calculated using the CKD-EPI Creatinine Equation (2021)    Anion gap 20 (H) 5 - 15    Comment: ELECTROLYTES REPEATED TO VERIFY Performed at [facility] Lab, [address], [place], [place] [address]   Glucose, capillary     Status: Abnormal   Collection Time: 11/12/22  5:12 PM  Result Value Ref Range   Glucose-Capillary 107 (H) 70 - 99 mg/dL    Comment: Glucose reference range applies only to samples taken after fasting for at least 8 hours.  I-STAT 7, (LYTES, BLD GAS, ICA, H+H)     Status: Abnormal   Collection Time: 11/12/22  5:36 PM  Result Value Ref Range   pH, Arterial 7.320 (L) 7.35 - 7.45   pCO2 arterial 41.5 32 - 48 mmHg   pO2, Arterial 127 (H) 83 - 108 mmHg   Bicarbonate 21.5 20.0 - 28.0 mmol/L   TCO2 23 22 - 32 mmol/L   O2 Saturation 99 %   Acid-base deficit 4.0 (H) 0.0 - 2.0 mmol/L   Sodium 132 (L) 135 - 145 mmol/L   Potassium 4.6 3.5 - 5.1 mmol/L   Calcium, Ion 0.88 (LL) 1.15 - 1.40 mmol/L   HCT 26.0 (L) 36.0 - 46.0 %   Hemoglobin 8.8 (L) 12.0 - 15.0 g/dL   Patient temperature 60.4 F    Collection site Web designer by [employer]    Sample type ARTERIAL    Comment NOTIFIED PHYSICIAN   Glucose, capillary     Status: Abnormal   Collection Time: 11/12/22  7:23 PM  Result Value Ref Range   Glucose-Capillary 62 (L) 70 - 99 mg/dL    Comment: Glucose reference range applies only to samples taken after fasting for at least 8 hours.  Glucose, capillary     Status: Abnormal   Collection Time: 11/12/22  7:58 PM  Result Value Ref Range   Glucose-Capillary 132 (H) 70 - 99 mg/dL     Comment: Glucose reference range applies only to samples taken after fasting for at least 8 hours.  Glucose, capillary     Status: Abnormal   Collection Time: 11/12/22  9:11 PM  Result Value Ref Range   Glucose-Capillary 131 (H) 70 - 99 mg/dL    Comment: Glucose reference range applies only to samples taken after fasting for at least 8 hours.  Glucose, capillary     Status: Abnormal   Collection Time: 11/12/22 11:08 PM  Result Value Ref Range   Glucose-Capillary 119 (H) 70 - 99 mg/dL    Comment: Glucose reference range applies only to samples taken after fasting for at least 8 hours.  Basic metabolic panel     Status: Abnormal   Collection Time: 11/12/22 11:10 PM  Result Value Ref Range   Sodium 133 (L) 135 - 145 mmol/L   Potassium 5.2 (H) 3.5 - 5.1 mmol/L   Chloride 97 (L) 98 - 111 mmol/L   CO2 17 (L) 22 - 32 mmol/L   Glucose, Bld 112 (H) 70 - 99 mg/dL    Comment: Glucose reference range applies only to samples taken after fasting for at least 8 hours.   BUN 20 6 - 20 mg/dL   Creatinine, Ser 5.40 (H) 0.44 - 1.00 mg/dL   Calcium 7.3 (L) 8.9 - 10.3 mg/dL   GFR, Estimated 21 (L) >60 mL/min  Comment: (NOTE) Calculated using the CKD-EPI Creatinine Equation (2021)    Anion gap 19 (H) 5 - 15    Comment: Performed at [facility] Lab, [address], [place], [place] [address]  CK     Status: Abnormal   Collection Time: 11/12/22 11:10 PM  Result Value Ref Range   Total CK 22,392 (H) 38 - 234 U/L    Comment: RESULT CONFIRMED BY MANUAL DILUTION Performed at [facility] Lab, [address] [place], [place] [address]   DIC Panel Daily     Status: Abnormal   Collection Time: 11/12/22 11:10 PM  Result Value Ref Range   Prothrombin Time 50.2 (H) 11.4 - 15.2 seconds   INR 5.6 (HH) 0.8 - 1.2    Comment: REPEATED TO VERIFY CRITICAL RESULT CALLED TO, READ BACK BY AND VERIFIED WITH: K. RICE RN 11/13/22  BY J. WHITE (NOTE) INR goal varies based on device and disease states.     aPTT 55 (H) 24 - 36 seconds    Comment:        IF BASELINE aPTT IS ELEVATED, SUGGEST PATIENT RISK ASSESSMENT BE USED TO DETERMINE APPROPRIATE ANTICOAGULANT THERAPY.    Fibrinogen 118 (L) 210 - 475 mg/dL    Comment: (NOTE) Fibrinogen results may be underestimated in patients receiving thrombolytic therapy.    D-Dimer, Quant >20.00 (H) 0.00 - 0.50 ug/mL-FEU    Comment: (NOTE) At the manufacturer cut-off value of 0.5 g/mL FEU, this assay has a negative predictive value of 95-100%.This assay is intended for use in conjunction with a clinical pretest probability (PTP) assessment model to exclude pulmonary embolism (PE) and deep venous thrombosis (DVT) in outpatients suspected of PE or DVT. Results should be correlated with clinical presentation.    Platelets 57 (L) 150 - 400 K/uL    Comment: Immature Platelet Fraction may be clinically indicated, consider ordering this additional test [id number] REPEATED TO VERIFY    Smear Review NO SCHISTOCYTES SEEN     Comment: Performed at [facility] Lab, [address], [place], [place] [address]  Troponin I (High Sensitivity)     Status: Abnormal   Collection Time: 11/12/22 11:10 PM  Result Value Ref Range   Troponin I (High Sensitivity) 11,120 (HH) <18 ng/L    Comment: CRITICAL VALUE NOTED. VALUE IS CONSISTENT WITH PREVIOUSLY REPORTED/CALLED VALUE (NOTE) Elevated high sensitivity troponin I (hsTnI) values and significant  changes across serial measurements may suggest ACS but many other  chronic and acute conditions are known to elevate hsTnI results.  Refer to the "Links" section for chest pain algorithms and additional  guidance. Performed at [facility], [address] [place], [place] [address]   Glucose, capillary     Status: Abnormal   Collection Time: 11/13/22  2:04 AM  Result Value Ref Range   Glucose-Capillary 102 (H) 70 - 99 mg/dL    Comment: Glucose reference range applies only to samples taken after fasting for  at least 8 hours.  Glucose, capillary     Status: Abnormal   Collection Time: 11/13/22  3:22 AM  Result Value Ref Range   Glucose-Capillary 103 (H) 70 - 99 mg/dL    Comment: Glucose reference range applies only to samples taken after fasting for at least 8 hours.  Basic metabolic panel     Status: Abnormal   Collection Time: 11/13/22  3:25 AM  Result Value Ref Range   Sodium 134 (L) 135 - 145 mmol/L   Potassium 5.8 (  H) 3.5 - 5.1 mmol/L   Chloride 98 98 - 111 mmol/L   CO2 17 (L) 22 - 32 mmol/L   Glucose, Bld 102 (H) 70 - 99 mg/dL    Comment: Glucose reference range applies only to samples taken after fasting for at least 8 hours.   BUN 20 6 - 20 mg/dL   Creatinine, Ser 9.32 (H) 0.44 - 1.00 mg/dL   Calcium 7.0 (L) 8.9 - 10.3 mg/dL   GFR, Estimated 22 (L) >60 mL/min    Comment: (NOTE) Calculated using the CKD-EPI Creatinine Equation (2021)    Anion gap 19 (H) 5 - 15    Comment: Performed at [facility] Lab, [address] [place], [place] [address]  CK     Status: Abnormal   Collection Time: 11/13/22  3:25 AM  Result Value Ref Range   Total CK 33,719 (H) 38 - 234 U/L    Comment: RESULT CONFIRMED BY MANUAL DILUTION Performed at [facility] Lab, [address], [place], [place] [address]   Hepatic function panel     Status: Abnormal   Collection Time: 11/13/22  3:25 AM  Result Value Ref Range   Total Protein 4.8 (L) 6.5 - 8.1 g/dL   Albumin 3.0 (L) 3.5 - 5.0 g/dL   AST >83,382 (H) 15 - 41 U/L    Comment: RESULT CONFIRMED BY MANUAL DILUTION   ALT 3,757 (H) 0 - 44 U/L    Comment: RESULT CONFIRMED BY MANUAL DILUTION   Alkaline Phosphatase 123 38 - 126 U/L   Total Bilirubin 6.5 (H) 0.3 - 1.2 mg/dL   Bilirubin, Direct 4.0 (H) 0.0 - 0.2 mg/dL   Indirect Bilirubin 2.5 (H) 0.3 - 0.9 mg/dL    Comment: Performed at [facility], [address], [place], [place] [address]  Troponin I (High Sensitivity)     Status: Abnormal   Collection Time: 11/13/22  3:25 AM  Result Value  Ref Range   Troponin I (High Sensitivity) 12,065 (HH) <18 ng/L    Comment: CRITICAL VALUE NOTED. VALUE IS CONSISTENT WITH PREVIOUSLY REPORTED/CALLED VALUE (NOTE) Elevated high sensitivity troponin I (hsTnI) values and significant  changes across serial measurements may suggest ACS but many other  chronic and acute conditions are known to elevate hsTnI results.  Refer to the "Links" section for chest pain algorithms and additional  guidance. Performed at [facility] Lab, [address] [place], [place] [address]   Glucose, capillary     Status: Abnormal   Collection Time: 11/13/22  5:06 AM  Result Value Ref Range   Glucose-Capillary 100 (H) 70 - 99 mg/dL    Comment: Glucose reference range applies only to samples taken after fasting for at least 8 hours.  Renal function panel (daily at 0500)     Status: Abnormal   Collection Time: 11/13/22  5:07 AM  Result Value Ref Range   Sodium 135 135 - 145 mmol/L   Potassium 6.0 (H) 3.5 - 5.1 mmol/L   Chloride 97 (L) 98 - 111 mmol/L   CO2 18 (L) 22 - 32 mmol/L   Glucose, Bld 97 70 - 99 mg/dL    Comment: Glucose reference range applies only to samples taken after fasting for at least 8 hours.   BUN 19 6 - 20 mg/dL   Creatinine, Ser 1.93 (H) 0.44 - 1.00 mg/dL   Calcium 7.1 (L) 8.9 - 10.3 mg/dL   Phosphorus 5.9 (H) 2.5 - 4.6 mg/dL   Albumin  3.2 (L) 3.5 - 5.0 g/dL   GFR, Estimated 23 (L) >60 mL/min    Comment: (NOTE) Calculated using the CKD-EPI Creatinine Equation (2021)    Anion gap 20 (H) 5 - 15    Comment: Performed at [facility] Lab, [address], [place], [place] [address]  Magnesium     Status: Abnormal   Collection Time: 11/13/22  8:04 AM  Result Value Ref Range   Magnesium 2.9 (H) 1.7 - 2.4 mg/dL    Comment: Performed at [facility], [address] [place], [place] [address]  CBC     Status: Abnormal   Collection Time: 11/13/22  8:04 AM  Result Value Ref Range   WBC 14.0 (H) 4.0 - 10.5 K/uL   RBC 2.57 (L) 3.87  - 5.11 MIL/uL   Hemoglobin 8.4 (L) 12.0 - 15.0 g/dL   HCT 78.2 (L) 42.3 - 53.6 %   MCV 100.0 80.0 - 100.0 fL   MCH 32.7 26.0 - 34.0 pg   MCHC 32.7 30.0 - 36.0 g/dL   RDW 14.4 31.5 - 40.0 %   Platelets 49 (L) 150 - 400 K/uL    Comment: Immature Platelet Fraction may be clinically indicated, consider ordering this additional test [id number] REPEATED TO VERIFY    nRBC 10.2 (H) 0.0 - 0.2 %    Comment: Performed at [facility] Lab, [address], [place], [place] [address]  Glucose, capillary     Status: None   Collection Time: 11/13/22  8:09 AM  Result Value Ref Range   Glucose-Capillary 94 70 - 99 mg/dL    Comment: Glucose reference range applies only to samples taken after fasting for at least 8 hours.  Glucose, capillary     Status: Abnormal   Collection Time: 11/13/22 11:53 AM  Result Value Ref Range   Glucose-Capillary 114 (H) 70 - 99 mg/dL    Comment: Glucose reference range applies only to samples taken after fasting for at least 8 hours.  Renal function panel (daily at 1600)     Status: Abnormal   Collection Time: 11/13/22  2:14 PM  Result Value Ref Range   Sodium 133 (L) 135 - 145 mmol/L   Potassium 6.7 (HH) 3.5 - 5.1 mmol/L    Comment: CRITICAL RESULT CALLED TO, READ BACK BY AND VERIFIED WITH C,RICE RN @1543  11/13/22 E,[place]   Chloride 99 98 - 111 mmol/L   CO2 20 (L) 22 - 32 mmol/L   Glucose, Bld 128 (H) 70 - 99 mg/dL    Comment: Glucose reference range applies only to samples taken after fasting for at least 8 hours.   BUN 19 6 - 20 mg/dL   Creatinine, Ser 14/10/23 (H) 0.44 - 1.00 mg/dL   Calcium 6.6 (L) 8.9 - 10.3 mg/dL   Phosphorus 7.4 (H) 2.5 - 4.6 mg/dL   Albumin 3.2 (L) 3.5 - 5.0 g/dL   GFR, Estimated 25 (L) >60 mL/min    Comment: (NOTE) Calculated using the CKD-EPI Creatinine Equation (2021)    Anion gap 14 5 - 15    Comment: Performed at [facility], [address] [place], [place]  CK     Status: Abnormal   Collection Time: 11/13/22   2:14 PM  Result Value Ref Range   Total CK 47,891 (H) 38 - 234 U/L    Comment: RESULT CONFIRMED BY MANUAL DILUTION Performed at [facility] Lab, [address], [place], [place]  Lactic acid, plasma     Status: Abnormal   Collection Time: 11/13/22  2:14 PM  Result Value Ref Range   Lactic Acid, Venous 8.4 (HH) 0.5 - 1.9 mmol/L    Comment: CRITICAL VALUE NOTED. VALUE IS CONSISTENT WITH PREVIOUSLY REPORTED/CALLED VALUE Performed at [facility], [address] [place], [place] [address]   Troponin I (High Sensitivity)     Status: Abnormal   Collection Time: 11/13/22  2:14 PM  Result Value Ref Range   Troponin I (High Sensitivity) 9,072 (HH) <18 ng/L    Comment: CRITICAL VALUE NOTED. VALUE IS CONSISTENT WITH PREVIOUSLY REPORTED/CALLED VALUE (NOTE) Elevated high sensitivity troponin I (hsTnI) values and significant  changes across serial measurements may suggest ACS but many other  chronic and acute conditions are known to elevate hsTnI results.  Refer to the "Links" section for chest pain algorithms and additional  guidance. Performed at [facility] Lab, [address], [place], [place] [address]   Glucose, capillary     Status: Abnormal   Collection Time: 11/13/22  3:28 PM  Result Value Ref Range   Glucose-Capillary 123 (H) 70 - 99 mg/dL    Comment: Glucose reference range applies only to samples taken after fasting for at least 8 hours.  Glucose, capillary     Status: Abnormal   Collection Time: 11/13/22  7:48 PM  Result Value Ref Range   Glucose-Capillary 118 (H) 70 - 99 mg/dL    Comment: Glucose reference range applies only to samples taken after fasting for at least 8 hours.  Basic metabolic panel     Status: Abnormal   Collection Time: 11/13/22  7:50 PM  Result Value Ref Range   Sodium 131 (L) 135 - 145 mmol/L   Potassium 6.9 (HH) 3.5 - 5.1 mmol/L    Comment: CRITICAL RESULT CALLED TO, READ BACK BY AND VERIFIED WITH [facility] WHITE RN 11/13/22 2047 [place]   Chloride 99 98 - 111 mmol/L   CO2 19 (L) 22 - 32 mmol/L   Glucose, Bld 117 (H) 70 - 99 mg/dL    Comment: Glucose reference range applies only to samples taken after fasting for at least 8 hours.   BUN 18 6 - 20 mg/dL   Creatinine, Ser 8.29 (H) 0.44 - 1.00 mg/dL   Calcium 7.2 (L) 8.9 - 10.3 mg/dL   GFR, Estimated 25 (L) >60 mL/min    Comment: (NOTE) Calculated using the CKD-EPI Creatinine Equation (2021)    Anion gap 13 5 - 15    Comment: Performed at [facility] Lab, [address] [place], [place] [address]  CK     Status: Abnormal   Collection Time: 11/13/22  7:50 PM  Result Value Ref Range   Total CK 48,292 (H) 38 - 234 U/L    Comment: RESULT CONFIRMED BY MANUAL DILUTION Performed at [facility], [address] [place], [place] [address]   Glucose, capillary     Status: Abnormal   Collection Time: 11/13/22  9:17 PM  Result Value Ref Range   Glucose-Capillary 124 (H) 70 - 99 mg/dL    Comment: Glucose reference range applies only to samples taken after fasting for at least 8 hours.  DIC Panel Daily     Status: Abnormal   Collection Time: 11/13/22 11:02 PM  Result Value Ref Range   Prothrombin Time 45.9 (H) 11.4 - 15.2 seconds   INR 5.0 (HH) 0.8 - 1.2    Comment: REPEATED  TO VERIFY CRITICAL RESULT CALLED TO, READ BACK BY AND VERIFIED WITH: K MOLEN,RN 2022-11-28 0138 WILDERK (NOTE) INR goal varies based on device and disease states.    aPTT 42 (H) 24 - 36 seconds    Comment:        IF BASELINE aPTT IS ELEVATED, SUGGEST PATIENT RISK ASSESSMENT BE USED TO DETERMINE APPROPRIATE ANTICOAGULANT THERAPY. CORRECTED ON 12/11 AT 0139: PREVIOUSLY REPORTED AS 42        IF BASELINE aPTT IS ELEVATED, SUGGEST PATIENT RISK ASSESSMENT BE USED TO DETERMINE APPROPRIATE ANTICOAGULANT THERAPY.    Fibrinogen 127 (L) 210 - 475 mg/dL    Comment: (NOTE) Fibrinogen results may be underestimated in patients receiving thrombolytic therapy.    D-Dimer, Quant >20.00 (H) 0.00  - 0.50 ug/mL-FEU    Comment: (NOTE) At the manufacturer cut-off value of 0.5 g/mL FEU, this assay has a negative predictive value of 95-100%.This assay is intended for use in conjunction with a clinical pretest probability (PTP) assessment model to exclude pulmonary embolism (PE) and deep venous thrombosis (DVT) in outpatients suspected of PE or DVT. Results should be correlated with clinical presentation.    Platelets 28 (LL) 150 - 400 K/uL    Comment: Immature Platelet Fraction may be clinically indicated, consider ordering this additional test [id number] REPEATED TO VERIFY THIS CRITICAL RESULT HAS VERIFIED AND BEEN CALLED TO BECKY CUMMINGS,RN BY ZELDA BEECH ON 12 11 2023 AT 0155, AND HAS BEEN READ BACK.     Smear Review NO SCHISTOCYTES SEEN     Comment: Performed at [facility] Lab, [address], [place], [place] [address]  Hepatic function panel     Status: Abnormal   Collection Time: 11/13/22 11:02 PM  Result Value Ref Range   Total Protein 4.7 (L) 6.5 - 8.1 g/dL   Albumin 3.0 (L) 3.5 - 5.0 g/dL   AST 1,191 (H) 15 - 41 U/L    Comment: RESULT CONFIRMED BY MANUAL DILUTION   ALT 2,918 (H) 0 - 44 U/L    Comment: RESULT CONFIRMED BY MANUAL DILUTION   Alkaline Phosphatase 159 (H) 38 - 126 U/L   Total Bilirubin 8.1 (H) 0.3 - 1.2 mg/dL   Bilirubin, Direct 5.2 (H) 0.0 - 0.2 mg/dL   Indirect Bilirubin 2.9 (H) 0.3 - 0.9 mg/dL    Comment: Performed at [facility] Lab, [address], [place], [place] [address]  Basic metabolic panel     Status: Abnormal   Collection Time: 11/13/22 11:02 PM  Result Value Ref Range   Sodium 133 (L) 135 - 145 mmol/L   Potassium 5.8 (H) 3.5 - 5.1 mmol/L   Chloride 99 98 - 111 mmol/L   CO2 17 (L) 22 - 32 mmol/L   Glucose, Bld 103 (H) 70 - 99 mg/dL    Comment: Glucose reference range applies only to samples taken after fasting for at least 8 hours.   BUN 17 6 - 20 mg/dL   Creatinine, Ser 5.62 (H) 0.44 - 1.00 mg/dL   Calcium 7.2 (L) 8.9 - 10.3  mg/dL   GFR, Estimated 26 (L) >60 mL/min    Comment: (NOTE) Calculated using the CKD-EPI Creatinine Equation (2021)    Anion gap 17 (H) 5 - 15    Comment: Performed at [facility] Lab, [address] [place], [place] [address]  CK     Status: Abnormal   Collection Time: 11/13/22 11:02 PM  Result Value Ref Range   Total CK 46,313 (H) 38 -  234 U/L    Comment: RESULT CONFIRMED BY MANUAL DILUTION Performed at [facility], [address] [place], [place] [address]   Glucose, capillary     Status: Abnormal   Collection Time: 11/13/22 11:05 PM  Result Value Ref Range   Glucose-Capillary 113 (H) 70 - 99 mg/dL    Comment: Glucose reference range applies only to samples taken after fasting for at least 8 hours.  Glucose, capillary     Status: Abnormal   Collection Time: 11/15/2022  1:05 AM  Result Value Ref Range   Glucose-Capillary 108 (H) 70 - 99 mg/dL    Comment: Glucose reference range applies only to samples taken after fasting for at least 8 hours.  Renal function panel (daily at 0500)     Status: Abnormal   Collection Time: 2022/11/15  3:02 AM  Result Value Ref Range   Sodium 133 (L) 135 - 145 mmol/L   Potassium 5.3 (H) 3.5 - 5.1 mmol/L   Chloride 99 98 - 111 mmol/L   CO2 16 (L) 22 - 32 mmol/L   Glucose, Bld 93 70 - 99 mg/dL    Comment: Glucose reference range applies only to samples taken after fasting for at least 8 hours.   BUN 18 6 - 20 mg/dL   Creatinine, Ser 6.04 (H) 0.44 - 1.00 mg/dL   Calcium 7.0 (L) 8.9 - 10.3 mg/dL   Phosphorus 5.4 (H) 2.5 - 4.6 mg/dL   Albumin 2.9 (L) 3.5 - 5.0 g/dL   GFR, Estimated 26 (L) >60 mL/min    Comment: (NOTE) Calculated using the CKD-EPI Creatinine Equation (2021)    Anion gap 18 (H) 5 - 15    Comment: Performed at [facility], [address], [place], [place] [address]  Glucose, capillary     Status: Abnormal   Collection Time: 2022/11/15  3:02 AM  Result Value Ref Range   Glucose-Capillary 100 (H) 70 - 99 mg/dL     Comment: Glucose reference range applies only to samples taken after fasting for at least 8 hours.  Glucose, capillary     Status: Abnormal   Collection Time: 2022/11/15  6:07 AM  Result Value Ref Range   Glucose-Capillary 106 (H) 70 - 99 mg/dL    Comment: Glucose reference range applies only to samples taken after fasting for at least 8 hours.  Magnesium     Status: Abnormal   Collection Time: 11/15/2022  6:10 AM  Result Value Ref Range   Magnesium 2.7 (H) 1.7 - 2.4 mg/dL    Comment: Performed at [facility], [address] [place], [place] [address]  Basic metabolic panel     Status: Abnormal   Collection Time: Nov 15, 2022  6:10 AM  Result Value Ref Range   Sodium 133 (L) 135 - 145 mmol/L   Potassium 5.4 (H) 3.5 - 5.1 mmol/L   Chloride 98 98 - 111 mmol/L   CO2 17 (L) 22 - 32 mmol/L   Glucose, Bld 98 70 - 99 mg/dL    Comment: Glucose reference range applies only to samples taken after fasting for at least 8 hours.   BUN 18 6 - 20 mg/dL   Creatinine, Ser 7.82 (H) 0.44 - 1.00 mg/dL   Calcium 7.2 (L) 8.9 - 10.3 mg/dL   GFR, Estimated 25 (L) >60 mL/min    Comment: (NOTE) Calculated using the CKD-EPI Creatinine Equation (2021)    Anion gap 18 (H) 5 - 15    Comment:  Performed at [facility], [address], [place], [place] [address]  CK     Status: Abnormal   Collection Time: 12/04/2022  6:10 AM  Result Value Ref Range   Total CK 48,122 (H) 38 - 234 U/L    Comment: RESULT CONFIRMED BY MANUAL DILUTION Performed at [facility], [address], [place], [place] [address]   CBC     Status: Abnormal   Collection Time: 04-Dec-2022  6:30 AM  Result Value Ref Range   WBC 19.3 (H) 4.0 - 10.5 K/uL   RBC 2.57 (L) 3.87 - 5.11 MIL/uL   Hemoglobin 8.3 (L) 12.0 - 15.0 g/dL   HCT 09.8 (L) 11.9 - 14.7 %   MCV 104.3 (H) 80.0 - 100.0 fL   MCH 32.3 26.0 - 34.0 pg   MCHC 31.0 30.0 - 36.0 g/dL   RDW 82.9 56.2 - 13.0 %   Platelets 22 (LL) 150 - 400 K/uL    Comment: Immature Platelet  Fraction may be clinically indicated, consider ordering this additional test [id number] CRITICAL VALUE NOTED.  VALUE IS CONSISTENT WITH PREVIOUSLY REPORTED AND CALLED VALUE. REPEATED TO VERIFY    nRBC 18.4 (H) 0.0 - 0.2 %    Comment: Performed at [facility], [address] [place], [place] [address]  Glucose, capillary     Status: Abnormal   Collection Time: 2022/12/04  7:25 AM  Result Value Ref Range   Glucose-Capillary 102 (H) 70 - 99 mg/dL    Comment: Glucose reference range applies only to samples taken after fasting for at least 8 hours.  I-STAT 7, (LYTES, BLD GAS, ICA, H+H)     Status: Abnormal   Collection Time: 12-04-2022  9:37 AM  Result Value Ref Range   pH, Arterial 7.257 (L) 7.35 - 7.45   pCO2 arterial 37.4 32 - 48 mmHg   pO2, Arterial 61 (L) 83 - 108 mmHg   Bicarbonate 16.7 (L) 20.0 - 28.0 mmol/L   TCO2 18 (L) 22 - 32 mmol/L   O2 Saturation 88 %   Acid-base deficit 10.0 (H) 0.0 - 2.0 mmol/L   Sodium 130 (L) 135 - 145 mmol/L   Potassium 5.0 3.5 - 5.1 mmol/L   Calcium, Ion 0.97 (L) 1.15 - 1.40 mmol/L   HCT 24.0 (L) 36.0 - 46.0 %   Hemoglobin 8.2 (L) 12.0 - 15.0 g/dL   Patient temperature 84.1 F    Collection site Web designer by [employer]    Sample type ARTERIAL     Recent Results (from the past 240 hour(s))  Blood culture (routine x 2)     Status: Abnormal   Collection Time: 11/06/22 10:52 PM   Specimen: BLOOD RIGHT ARM  Result Value Ref Range Status   Specimen Description BLOOD RIGHT ARM  Final   Special Requests   Final    BOTTLES DRAWN AEROBIC AND ANAEROBIC Blood Culture adequate volume   Culture  Setup Time   Final    GRAM POSITIVE COCCI IN CHAINS ANAEROBIC BOTTLE ONLY CRITICAL RESULT CALLED TO, READ BACK BY AND VERIFIED WITH: RN SARAH JAMES ON 11/07/22 @ 1900 BY DRT    Culture (A)  Final    STREPTOCOCCUS MITIS/ORALIS THE SIGNIFICANCE OF ISOLATING THIS ORGANISM FROM A SINGLE SET OF BLOOD CULTURES WHEN MULTIPLE SETS ARE DRAWN IS UNCERTAIN. PLEASE  NOTIFY THE MICROBIOLOGY DEPARTMENT WITHIN ONE WEEK IF SPECIATION AND SENSITIVITIES ARE REQUIRED. Performed at [facility], [address] [place], [place] [address]    Report Status 11/09/2022 FINAL  Final  Blood Culture ID Panel (Reflexed)     Status: Abnormal   Collection Time: 11/06/22 10:52 PM  Result Value Ref Range Status   Enterococcus faecalis NOT DETECTED NOT DETECTED Final   Enterococcus Faecium NOT DETECTED NOT DETECTED Final   Listeria monocytogenes NOT DETECTED NOT DETECTED Final   Staphylococcus species NOT DETECTED NOT DETECTED Final   Staphylococcus aureus (BCID) NOT DETECTED NOT DETECTED Final   Staphylococcus epidermidis NOT DETECTED NOT DETECTED Final   Staphylococcus lugdunensis NOT DETECTED NOT DETECTED Final   Streptococcus species DETECTED (A) NOT DETECTED Final    Comment: Not Enterococcus species, Streptococcus agalactiae, Streptococcus pyogenes, or Streptococcus pneumoniae. CRITICAL RESULT CALLED TO, READ BACK BY AND VERIFIED WITH: RN SARAH JAMES ON 11/07/22 @ 1900 BY DRT    Streptococcus agalactiae NOT DETECTED NOT DETECTED Final   Streptococcus pneumoniae NOT DETECTED NOT DETECTED Final   Streptococcus pyogenes NOT DETECTED NOT DETECTED Final   A.calcoaceticus-baumannii NOT DETECTED NOT DETECTED Final   Bacteroides fragilis NOT DETECTED NOT DETECTED Final   Enterobacterales NOT DETECTED NOT DETECTED Final   Enterobacter cloacae complex NOT DETECTED NOT DETECTED Final   Escherichia coli NOT DETECTED NOT DETECTED Final   Klebsiella aerogenes NOT DETECTED NOT DETECTED Final   Klebsiella oxytoca NOT DETECTED NOT DETECTED Final   Klebsiella pneumoniae NOT DETECTED NOT DETECTED Final   Proteus species NOT DETECTED NOT DETECTED Final   Salmonella species NOT DETECTED NOT DETECTED Final   Serratia marcescens NOT DETECTED NOT DETECTED Final   Haemophilus influenzae NOT DETECTED NOT DETECTED Final   Neisseria meningitidis NOT DETECTED NOT DETECTED Final    Pseudomonas aeruginosa NOT DETECTED NOT DETECTED Final   Stenotrophomonas maltophilia NOT DETECTED NOT DETECTED Final   Candida albicans NOT DETECTED NOT DETECTED Final   Candida auris NOT DETECTED NOT DETECTED Final   Candida glabrata NOT DETECTED NOT DETECTED Final   Candida krusei NOT DETECTED NOT DETECTED Final   Candida parapsilosis NOT DETECTED NOT DETECTED Final   Candida tropicalis NOT DETECTED NOT DETECTED Final   Cryptococcus neoformans/gattii NOT DETECTED NOT DETECTED Final    Comment: Performed at [facility], [address] [place], [place] [address]  Blood culture (routine x 2)     Status: None   Collection Time: 11/07/22  1:48 AM   Specimen: BLOOD RIGHT FOREARM  Result Value Ref Range Status   Specimen Description BLOOD RIGHT FOREARM  Final   Special Requests   Final    BOTTLES DRAWN AEROBIC AND ANAEROBIC Blood Culture adequate volume   Culture   Final    NO GROWTH 5 DAYS Performed at [facility], [address], [place], [place] [address]    Report Status 11/12/2022 FINAL  Final  Urine Culture     Status: Abnormal   Collection Time: 11/07/22  1:59 AM   Specimen: Urine, Clean Catch  Result Value Ref Range Status   Specimen Description URINE, CLEAN CATCH  Final   Special Requests   Final    NONE Performed at [facility], [address] [place], [place] [address]    Culture 20,000 COLONIES/mL STAPHYLOCOCCUS EPIDERMIDIS (A)  Final   Report Status 11/09/2022 FINAL  Final   Organism ID, Bacteria STAPHYLOCOCCUS EPIDERMIDIS (A)  Final      Susceptibility   Staphylococcus epidermidis - MIC*    CIPROFLOXACIN <=0.5 SENSITIVE Sensitive     GENTAMICIN <=0.5 SENSITIVE  Sensitive     NITROFURANTOIN <=16 SENSITIVE Sensitive     OXACILLIN >=4 RESISTANT Resistant     TETRACYCLINE <=1 SENSITIVE Sensitive     VANCOMYCIN 1 SENSITIVE Sensitive     TRIMETH/SULFA <=10 SENSITIVE Sensitive     CLINDAMYCIN <=0.25 SENSITIVE Sensitive     RIFAMPIN <=0.5 SENSITIVE  Sensitive     Inducible Clindamycin NEGATIVE Sensitive     * 20,000 COLONIES/mL STAPHYLOCOCCUS EPIDERMIDIS  MRSA Next Gen by PCR, Nasal     Status: None   Collection Time: 11/07/22  1:59 AM   Specimen: Urine, Clean Catch; Nasal Swab  Result Value Ref Range Status   MRSA by PCR Next Gen NOT DETECTED NOT DETECTED Final    Comment: (NOTE) The GeneXpert MRSA Assay (FDA approved for NASAL specimens only), is one component of a comprehensive MRSA colonization surveillance program. It is not intended to diagnose MRSA infection nor to guide or monitor treatment for MRSA infections. Test performance is not FDA approved in patients less than 41 years old. Performed at [facility], [address], [place], [place] [address]   MRSA Next Gen by PCR, Nasal     Status: None   Collection Time: 11/11/22  3:42 AM   Specimen: Nasal Mucosa; Nasal Swab  Result Value Ref Range Status   MRSA by PCR Next Gen NOT DETECTED NOT DETECTED Final    Comment: (NOTE) The GeneXpert MRSA Assay (FDA approved for NASAL specimens only), is one component of a comprehensive MRSA colonization surveillance program. It is not intended to diagnose MRSA infection nor to guide or monitor treatment for MRSA infections. Test performance is not FDA approved in patients less than 61 years old. Performed at [facility], [address], [place], [place] [address]   Culture, Respiratory w Gram Stain     Status: None   Collection Time: 11/11/22  8:53 AM   Specimen: Tracheal Aspirate; Respiratory  Result Value Ref Range Status   Specimen Description TRACHEAL ASPIRATE  Final   Special Requests NONE  Final   Gram Stain   Final    FEW WBC PRESENT, PREDOMINANTLY PMN ABUNDANT GRAM POSITIVE COCCI IN CLUSTERS IN PAIRS IN CHAINS Performed at [facility], [address], [place], [place] [address]    Culture ABUNDANT STAPHYLOCOCCUS AUREUS  Final   Report Status 11/13/2022 FINAL  Final   Organism ID, Bacteria  STAPHYLOCOCCUS AUREUS  Final      Susceptibility   Staphylococcus aureus - MIC*    CIPROFLOXACIN <=0.5 SENSITIVE Sensitive     ERYTHROMYCIN <=0.25 SENSITIVE Sensitive     GENTAMICIN <=0.5 SENSITIVE Sensitive     OXACILLIN 0.5 SENSITIVE Sensitive     TETRACYCLINE <=1 SENSITIVE Sensitive     VANCOMYCIN <=0.5 SENSITIVE Sensitive     TRIMETH/SULFA <=10 SENSITIVE Sensitive     CLINDAMYCIN <=0.25 SENSITIVE Sensitive     RIFAMPIN <=0.5 SENSITIVE Sensitive     Inducible Clindamycin NEGATIVE Sensitive     * ABUNDANT STAPHYLOCOCCUS AUREUS  Culture, blood (Routine X 2) w Reflex to ID Panel     Status: None (Preliminary result)   Collection Time: 11/11/22  9:40 AM   Specimen: BLOOD  Result Value Ref Range Status   Specimen Description BLOOD BLOOD RIGHT ARM  Final   Special Requests IN PEDIATRIC BOTTLE Blood Culture adequate volume  Final   Culture   Final    NO GROWTH 3 DAYS Performed at [facility], [address],  [place], [place] [address]    Report Status PENDING  Incomplete  Culture, blood (Routine X 2) w Reflex to ID Panel     Status: None (Preliminary result)   Collection Time: 11/11/22  9:40 AM   Specimen: BLOOD  Result Value Ref Range Status   Specimen Description BLOOD BLOOD RIGHT ARM  Final   Special Requests IN PEDIATRIC BOTTLE Blood Culture adequate volume  Final   Culture   Final    NO GROWTH 3 DAYS Performed at [facility], [address] [place], [place] [address]    Report Status PENDING  Incomplete    Lipid Panel No results for input(s): "CHOL", "TRIG", "HDL", "CHOLHDL", "VLDL", "LDLCALC" in the last 72 hours.  Studies/Results: ECHOCARDIOGRAM LIMITED  Result Date: 11/13/2022    ECHOCARDIOGRAM LIMITED REPORT   Patient Name:   FYNN ADEL Date of Exam: 11/12/2022 Medical Rec #:  [id number]       Height:       69.0 in Accession #:    [id number]      Weight:       215.6 lb Date of Birth:  1986/11/30       BSA:          2.133 m Patient Age:    36 years         BP:           105/33 mmHg Patient Gender: F               HR:           73 bpm. Exam Location:  Inpatient Procedure: Limited Echo and Color Doppler Indications:    CHF  History:        Patient has prior history of Echocardiogram examinations, most                 recent 11/07/2022. Arrythmias:Ventricular Fibrillation.                 Polysubstance abuse.  Sonographer:    Ross Ludwig RDCS (AE) Referring Phys: [id number] Tobey Grim  Sonographer Comments: Echo performed with patient supine and on artificial respirator. IMPRESSIONS  1. Left ventricular ejection fraction, by estimation, is 60 to 65%. The left ventricle has normal function. The left ventricle has no regional wall motion abnormalities.  2. Right ventricular systolic function is normal. The right ventricular size is normal. Tricuspid regurgitation signal is inadequate for assessing PA pressure.  3. The mitral valve is normal in structure. No evidence of mitral valve regurgitation. No evidence of mitral stenosis.  4. The aortic valve is normal in structure. Aortic valve regurgitation is not visualized. No aortic stenosis is present.  5. The inferior vena cava is normal in size with <50% respiratory variability, suggesting right atrial pressure of 8 mmHg. FINDINGS  Left Ventricle: Left ventricular ejection fraction, by estimation, is 60 to 65%. The left ventricle has normal function. The left ventricle has no regional wall motion abnormalities. The left ventricular internal cavity size was normal in size. There is  no left ventricular hypertrophy. Right Ventricle: The right ventricular size is normal. No increase in right ventricular wall thickness. Right ventricular systolic function is normal. Tricuspid regurgitation signal is inadequate for assessing PA pressure. Left Atrium: Left atrial size was normal in size. Right Atrium: Right atrial size was normal in size. Pericardium: There is no evidence of pericardial effusion. Mitral Valve: The mitral  valve is normal in structure. No evidence of mitral valve stenosis. Tricuspid  Valve: The tricuspid valve is normal in structure. Tricuspid valve regurgitation is trivial. No evidence of tricuspid stenosis. Aortic Valve: The aortic valve is normal in structure. Aortic valve regurgitation is not visualized. No aortic stenosis is present. Pulmonic Valve: The pulmonic valve was normal in structure. Pulmonic valve regurgitation is not visualized. No evidence of pulmonic stenosis. Aorta: The aortic root is normal in size and structure. Venous: The inferior vena cava is normal in size with less than 50% respiratory variability, suggesting right atrial pressure of 8 mmHg. IAS/Shunts: No atrial level shunt detected by color flow Doppler. Additional Comments: Color Doppler performed.  LEFT VENTRICLE PLAX 2D LVIDd:         4.20 cm LVIDs:         2.70 cm LV PW:         1.10 cm LV IVS:        0.90 cm LVOT diam:     2.20 cm LVOT Area:     3.80 cm  LEFT ATRIUM         Index LA diam:    3.10 cm 1.45 cm/m   AORTA Ao Root diam: 2.40 cm Ao Asc diam:  2.50 cm  SHUNTS Systemic Diam: 2.20 cm Armanda Magicraci Turner MD Electronically signed by Armanda Magicraci Turner MD Signature Date/Time: 11/13/2022/11:37:32 AM    Final    Overnight EEG with video  Result Date: 11/13/2022 Windell Norfolkamara, Amadou, MD     11/13/2022  8:46 AM EEG Procedure CPT/Type of Study: [id number]; 2-12hr EEG with video Referring Provider: Ernestina Columbiade la Torre Primary Neurological Diagnosis: cardiac arrest History: This is a 37 yr old patient, undergoing an EEG to evaluate for cardiac arrest. Clinical State: comatose Technical Description: The EEG was performed using standard setting per the guidelines of American Clinical Neurophysiology Society (ACNS). A minimum of 21 electrodes were placed on scalp according to the International 10-20 or/and 10-10 Systems. Supplemental electrodes were placed as needed. Single EKG electrode was also used to detect cardiac arrhythmia. Patient's behavior was continuously  recorded on video simultaneously with EEG. A minimum of 16 channels were used for data display. Each epoch of study was reviewed manually daily and as needed using standard referential and bipolar montages. Computerized quantitative EEG analysis (such as compressed spectral array analysis, trending, automated spike & seizure detection) were used as indicated. Day 1: from 1130 11/12/22 to 0730 11/13/22 EEG Description: Overall Amplitude: Low Predominant Frequency: Delta  Superimposed Frequencies: none The background was symmetric Background Abnormalities: Diffuse slowing Rhythmic or periodic pattern: No Epileptiform activity: no Electrographic seizures: Yes, described as 3 to 3.5 Hz generalized discharges Events: no Breach rhythm: no Reactivity: Absent Stimulation procedures: Hyperventilation: not done Photic stimulation: not done Sleep Background: none EKG:no significant arrhythmia Impression: This is a markedly abnormal continuous video EEG due to a presence of a prolong seizure 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 After administration of medications, background changed to moderate diffuse slowing. Windell NorfolkAmadou Camara, MD    Medications: Scheduled:  Chlorhexidine Gluconate Cloth  6 each Topical Q0600   hydrocortisone sod succinate (SOLU-CORTEF) inj  100 mg Intravenous Q8H   insulin aspart  0-6 Units Subcutaneous Q4H   mouth rinse  15 mL Mouth Rinse Q2H   pantoprazole (PROTONIX) IV  40 mg Intravenous QHS   sodium chloride flush  10-40 mL Intracatheter Q12H   Continuous:  sodium chloride      ceFAZolin (ANCEF) IV 2 g (03-Aug-2022 1022)   dextrose 60 mL/hr at 03-Aug-2022 1000   feeding  supplement (VITAL 1.5 CAL) 1,000 mL (11/15/2022 1033)   levETIRAcetam Stopped (11/17/2022 0216)   midazolam Stopped (12/01/2022 0936)   norepinephrine (LEVOPHED) Adult infusion 13 mcg/min (11/16/2022 1000)   phytonadione (VITAMIN K) 10 mg in dextrose 5 % 50 mL IVPB 10 mg (11/15/2022 1033)   prismasol BGK 0/2.5 400  mL/hr at 11/08/2022 0949   prismasol BGK 0/2.5 400 mL/hr at 11/22/2022 0954   prismasol BGK 0/2.5 1,500 mL/hr at 11/17/2022 1033   vasopressin 0.04 Units/min (11/09/2022 1000)   Assessment: 37 year old patient with history of polysubstance abuse and depression was admitted after being found pulseless and apneic at home.  She was given Narcan by EMS, and CPR was performed with ROSC achieved. However, pulse was lost subsequently 3 times. On Saturday, she developed abnormal facial and arm movements and was found to be in status epilepticus based on cEEG at that time.  She was given lorazepam, loaded with Keppra and placed on scheduled Keppra and Versed gtt with cessation of seizure activity. - CT head 12/8: No acute intracranial process. Equivocal crowding of the midbrain.  - LTM EEG report for today AM: Markedly abnormal EEG due to mostly suppressed background with absent reactivity, indicative of a severe encephalopathy pattern. No seizures were seen.   - Exams: - On exam yesterday (Sunday), pupils were fixed and nonreactive, and patient had no response to noxious stimuli.   - Today's exam unchanged relative to yesterday.  - High suspicion for significant anoxic brain injury.  Patient is currently in the ICU and has been rather unstable on high doses of pressors. - On CRRT    - Would like to repeat CT scan of head given fixed pupils and crowded appearance of prior head CT, however patient is too unstable for this right now.   - Will need to correct metabolic abnormalities before being able to give prognosis based on neuro exam. - Patient will need MRI between day 3 and 5 post-arrest, when she is stable enough for scanning.  .   Impression:  - High suspicion for significant anoxic brain injury in this postcardiac arrest patient - Status epilepticus terminated with medications.   Recommendations: 1) Stopping Versed 2) Avoid hypotension, hyperthermia and hyponatremia 3) MRI brain between day 3 and 5  post-arrest 4) Discontinue LTM EEG if no seizure recurrence later this afternoon while off Versed 5) Continue Keppra 1000 mg q12 hours (CRRT dosing)    35 minutes spent in the neurological evaluation and management of this critically ill patient     LOS: 3 days   @Electronically  signed: Dr. Caryl Pina 11/20/2022  10:59 AM

## 2022-12-05 NOTE — Procedures (Signed)
Extubation Procedure Note  Patient Details:   Name: Sarrinah Gardin DOB: 1986/04/11 MRN: [id number]   Airway Documentation:    Vent end date: 12/05/2022 Vent end time: 1650   Evaluation  O2 sats:  terminal extubation  Complications: No apparent complications Patient did tolerate procedure well. Bilateral Breath Sounds: Clear   No  Pt was terminally extubated.   Merlene Laughter 2022-12-05, 5:13 PM

## 2022-12-05 NOTE — Procedures (Signed)
Admit: 12/03/2022 LOS: 3  75F AKI 2/2 ATN after cardiac arrest of unclear but likely prolonged duriation after overdose  Current CRRT Prescription: Start Date: 11/11/22 Catheter: R IJ Temp Cath placed 12/8 with CCM BFR: 300 Pre Blood Pump: 400 0K DFR: 1500 0K Replacement Rate: 400 0K Goal UF: net even Anticoagulation: none Clotting: infrequent  S: K 5.4 this AM, HCO3 17, P 5.4 Remais on NE / VP On CRRT, all 0K bath Anuric 0.4L positive yesterday comatose  O: 12/10 0701 - 12/11 0700 In: 3136.1 [I.V.:2104.8; NG/GT:196.3; IV Piggyback:835] Out: 2740   Filed Weights   11/12/22 0500 11/13/22 0500 Nov 29, 2022 0500  Weight: 97.8 kg 101.4 kg 101.6 kg    Recent Labs  Lab 11/13/22 0507 11/13/22 1414 11/13/22 1950 11/13/22 2302 2022/11/29 0302 2022/11/29 0610 11-29-2022 0937  NA 135 133*   < > 133* 133* 133* 130*  K 6.0* 6.7*   < > 5.8* 5.3* 5.4* 5.0  CL 97* 99   < > 99 99 98  --   CO2 18* 20*   < > 17* 16* 17*  --   GLUCOSE 97 128*   < > 103* 93 98  --   BUN 19 19   < > 17 18 18   --   CREATININE 2.69* 2.50*   < > 2.41* 2.39* 2.48*  --   CALCIUM 7.1* 6.6*   < > 7.2* 7.0* 7.2*  --   PHOS 5.9* 7.4*  --   --  5.4*  --   --    < > = values in this interval not displayed.   Recent Labs  Lab 11/09/2022 2307 11/21/2022 2345 11/12/22 0345 11/12/22 0849 11/13/22 0804 11/13/22 2302 2022/11/29 0630 Nov 29, 2022 0937  WBC 9.0   < > 15.5*  --  14.0*  --  19.3*  --   NEUTROABS 5.3  --   --   --   --   --   --   --   HGB 12.7   < > 9.9*   < > 8.4*  --  8.3* 8.2*  HCT 43.3   < > 28.4*   < > 25.7*  --  26.8* 24.0*  MCV 113.9*   < > 95.6  --  100.0  --  104.3*  --   PLT 244   < > 104*   < > 49* 28* 22*  --    < > = values in this interval not displayed.    Scheduled Meds:  Chlorhexidine Gluconate Cloth  6 each Topical Q0600   hydrocortisone sod succinate (SOLU-CORTEF) inj  100 mg Intravenous Q8H   insulin aspart  0-6 Units Subcutaneous Q4H   mouth rinse  15 mL Mouth Rinse Q2H    pantoprazole (PROTONIX) IV  40 mg Intravenous QHS   sodium chloride flush  10-40 mL Intracatheter Q12H   Continuous Infusions:  sodium chloride      ceFAZolin (ANCEF) IV     dextrose 60 mL/hr at November 29, 2022 1000   feeding supplement (VITAL 1.5 CAL)     levETIRAcetam Stopped (11-29-22 0216)   midazolam Stopped (Nov 29, 2022 0936)   norepinephrine (LEVOPHED) Adult infusion 13 mcg/min (2022-11-29 1000)   phytonadione (VITAMIN K) 10 mg in dextrose 5 % 50 mL IVPB Stopped (11/13/22 1147)   prismasol BGK 0/2.5 400 mL/hr at 11-29-2022 0949   prismasol BGK 0/2.5 400 mL/hr at 11/29/22 0954   prismasol BGK 0/2.5 1,500 mL/hr at 11-29-2022 0045   vasopressin 0.04 Units/min (2022-11-29 1000)  PRN Meds:.sodium chloride, docusate sodium, heparin, midazolam, mouth rinse, polyethylene glycol, sodium chloride, sodium chloride flush  ABG    Component Value Date/Time   PHART 7.257 (L) 11-15-22 0937   PCO2ART 37.4 11/15/22 0937   PO2ART 61 (L) Nov 15, 2022 0937   HCO3 16.7 (L) 2022-11-15 0937   TCO2 18 (L) 11-15-2022 0937   ACIDBASEDEF 10.0 (H) November 15, 2022 0937   O2SAT 88 11-15-2022 0937    A/P  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

## 2022-12-05 NOTE — Progress Notes (Signed)
     Interdisciplinary Goals of Care Family Meeting   Date carried out: 11/16/2022  Location of the meeting: Phone conference  Member's involved: Physician, Bedside Registered Nurse, and Family Member or next of kin  Durable Power of Attorney or acting medical decision maker: mother Raquel  Discussion: We discussed goals of care for [facility] .  I called her mother Raquel to update her. She understands how ill she is and wants to transition her to comfort care today. She reports that her family wishes to remember her when she was healthy and does not want to see her this ill. Her nurse Sunny Schlein had heard this from her mother today and witnessed the phone conversation. Planning to d/c CRRT, MV, pressors and focus on comfort.  Code status: Full DNR  Disposition: In-patient comfort care   Time spent for the meeting: 15 min.  Steffanie Dunn 11/26/2022, 1:18 PM

## 2022-12-05 NOTE — Procedures (Signed)
EEG Procedure CPT/Type of Study: 95720; 24hr EEG with video Referring Provider: Ramaswamy Primary Neurological Diagnosis: cardiac arrest  History: This is a 37 yr old patient, undergoing an EEG to evaluate for cardiac arrest. Clinical State: comatose  Technical Description:  The EEG was performed using standard setting per the guidelines of American Clinical Neurophysiology Society (ACNS).  A minimum of 21 electrodes were placed on scalp according to the International 10-20 or/and 10-10 Systems. Supplemental electrodes were placed as needed. Single EKG electrode was also used to detect cardiac arrhythmia. Patient's behavior was continuously recorded on video simultaneously with EEG. A minimum of 16 channels were used for data display. Each epoch of study was reviewed manually daily and as needed using standard referential and bipolar montages. Computerized quantitative EEG analysis (such as compressed spectral array analysis, trending, automated spike & seizure detection) were used as indicated.   Day 2: from 0730 11/13/22 to 0730 11/20/2022  EEG Description: Overall Amplitude: suppressed/low Predominant Frequency: The background activity showed background suppression with some minimal low voltage 1-2Hz  slowing. Superimposed Frequencies:  The background was symmetric  Background Abnormalities: Generalized low voltage slowing occurring minimally with frequent suppression Rhythmic or periodic pattern: No Epileptiform activity: no Electrographic seizures: no Events: no   Breach rhythm: no  Reactivity: Absent  Stimulation procedures:  Hyperventilation: not done Photic stimulation: not done  Sleep Background: none  EKG:no significant arrhythmia  Impression: This was a markedly abnormal EEG due to mostly suppressed background with absent reactivity, indicative of a severe encephalopathy pattern. No seizures were seen.

## 2022-12-05 NOTE — Progress Notes (Signed)
eLink Physician-Brief Progress Note Patient Name: Jasiah Buntin DOB: 1986-05-28 MRN: [id number]   Date of Service  11/23/2022  HPI/Events of Note  Notified of critical labs with CKs continuing to go up.  INR 5.0.  Platelet down trending, now at 28.  Pt with no signs of bleeding.   eICU Interventions  Give vitamin K.     Intervention Category Intermediate Interventions: Coagulopathy - evaluation and management  Larinda Buttery 12/04/2022, 2:05 AM

## 2022-12-05 DEATH — deceased
